# Patient Record
Sex: Male | Born: 1955 | Race: Black or African American | Hispanic: No | Marital: Married | Smoking: Former smoker
Health system: Southern US, Community
[De-identification: ages and names within clinical notes are randomized; demographics above are authoritative.]

## PROBLEM LIST (undated history)

## (undated) DIAGNOSIS — I739 Peripheral vascular disease, unspecified: Secondary | ICD-10-CM

## (undated) DIAGNOSIS — I350 Nonrheumatic aortic (valve) stenosis: Secondary | ICD-10-CM

## (undated) DIAGNOSIS — E785 Hyperlipidemia, unspecified: Secondary | ICD-10-CM

## (undated) DIAGNOSIS — I10 Essential (primary) hypertension: Secondary | ICD-10-CM

## (undated) DIAGNOSIS — F419 Anxiety disorder, unspecified: Secondary | ICD-10-CM

## (undated) DIAGNOSIS — D649 Anemia, unspecified: Secondary | ICD-10-CM

## (undated) DIAGNOSIS — F17200 Nicotine dependence, unspecified, uncomplicated: Secondary | ICD-10-CM

## (undated) DIAGNOSIS — K219 Gastro-esophageal reflux disease without esophagitis: Secondary | ICD-10-CM

## (undated) DIAGNOSIS — E782 Mixed hyperlipidemia: Secondary | ICD-10-CM

## (undated) DIAGNOSIS — E119 Type 2 diabetes mellitus without complications: Secondary | ICD-10-CM

## (undated) HISTORY — DX: Type 2 diabetes mellitus without complications: E11.9

## (undated) HISTORY — DX: Essential (primary) hypertension: I10

## (undated) HISTORY — DX: Peripheral vascular disease, unspecified: I73.9

## (undated) HISTORY — DX: Mixed hyperlipidemia: E78.2

## (undated) HISTORY — PX: HERNIA REPAIR: SHX51

---
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
## 2002-08-26 ENCOUNTER — Emergency Department (HOSPITAL_COMMUNITY): Admission: EM | Admit: 2002-08-26 | Discharge: 2002-08-27 | Payer: Self-pay | Admitting: *Deleted

## 2002-08-26 ENCOUNTER — Encounter: Payer: Self-pay | Admitting: *Deleted

## 2005-03-03 ENCOUNTER — Emergency Department (HOSPITAL_COMMUNITY): Admission: EM | Admit: 2005-03-03 | Discharge: 2005-03-03 | Payer: Self-pay | Admitting: Emergency Medicine

## 2005-04-11 ENCOUNTER — Emergency Department (HOSPITAL_COMMUNITY): Admission: EM | Admit: 2005-04-11 | Discharge: 2005-04-11 | Payer: Self-pay | Admitting: Emergency Medicine

## 2007-01-31 ENCOUNTER — Emergency Department (HOSPITAL_COMMUNITY): Admission: EM | Admit: 2007-01-31 | Discharge: 2007-01-31 | Payer: Self-pay | Admitting: Emergency Medicine

## 2008-03-28 ENCOUNTER — Emergency Department (HOSPITAL_COMMUNITY): Admission: EM | Admit: 2008-03-28 | Discharge: 2008-03-28 | Payer: Self-pay | Admitting: Emergency Medicine

## 2008-09-10 ENCOUNTER — Emergency Department (HOSPITAL_COMMUNITY): Admission: EM | Admit: 2008-09-10 | Discharge: 2008-09-10 | Payer: Self-pay | Admitting: Emergency Medicine

## 2010-03-24 ENCOUNTER — Emergency Department (HOSPITAL_COMMUNITY)
Admission: EM | Admit: 2010-03-24 | Discharge: 2010-03-24 | Disposition: A | Discharge status: Home / Self Care | Payer: Medicaid Other | Attending: Emergency Medicine | Admitting: Emergency Medicine

## 2010-03-24 DIAGNOSIS — I10 Essential (primary) hypertension: Secondary | ICD-10-CM | POA: Insufficient documentation

## 2010-03-24 DIAGNOSIS — L259 Unspecified contact dermatitis, unspecified cause: Secondary | ICD-10-CM | POA: Insufficient documentation

## 2010-03-24 DIAGNOSIS — Z79899 Other long term (current) drug therapy: Secondary | ICD-10-CM | POA: Insufficient documentation

## 2010-03-24 DIAGNOSIS — E7800 Pure hypercholesterolemia, unspecified: Secondary | ICD-10-CM | POA: Insufficient documentation

## 2010-04-18 ENCOUNTER — Emergency Department (HOSPITAL_COMMUNITY)
Admission: EM | Admit: 2010-04-18 | Discharge: 2010-04-18 | Disposition: A | Discharge status: Home / Self Care | Payer: Medicaid Other | Attending: Emergency Medicine | Admitting: Emergency Medicine

## 2010-04-18 DIAGNOSIS — L259 Unspecified contact dermatitis, unspecified cause: Secondary | ICD-10-CM | POA: Insufficient documentation

## 2010-04-18 DIAGNOSIS — Z79899 Other long term (current) drug therapy: Secondary | ICD-10-CM | POA: Insufficient documentation

## 2010-04-18 DIAGNOSIS — E7800 Pure hypercholesterolemia, unspecified: Secondary | ICD-10-CM | POA: Insufficient documentation

## 2010-04-18 DIAGNOSIS — I10 Essential (primary) hypertension: Secondary | ICD-10-CM | POA: Insufficient documentation

## 2011-04-16 ENCOUNTER — Encounter (HOSPITAL_COMMUNITY): Payer: Self-pay | Admitting: *Deleted

## 2011-04-16 ENCOUNTER — Emergency Department (HOSPITAL_COMMUNITY): Payer: Medicaid Other

## 2011-04-16 ENCOUNTER — Emergency Department (HOSPITAL_COMMUNITY)
Admission: EM | Admit: 2011-04-16 | Discharge: 2011-04-16 | Disposition: A | Discharge status: Home / Self Care | Payer: Medicaid Other | Attending: Emergency Medicine | Admitting: Emergency Medicine

## 2011-04-16 DIAGNOSIS — E119 Type 2 diabetes mellitus without complications: Secondary | ICD-10-CM | POA: Insufficient documentation

## 2011-04-16 DIAGNOSIS — I10 Essential (primary) hypertension: Secondary | ICD-10-CM | POA: Insufficient documentation

## 2011-04-16 DIAGNOSIS — F17200 Nicotine dependence, unspecified, uncomplicated: Secondary | ICD-10-CM | POA: Insufficient documentation

## 2011-04-16 DIAGNOSIS — R1903 Right lower quadrant abdominal swelling, mass and lump: Secondary | ICD-10-CM | POA: Insufficient documentation

## 2011-04-16 DIAGNOSIS — R1031 Right lower quadrant pain: Secondary | ICD-10-CM | POA: Insufficient documentation

## 2011-04-16 HISTORY — DX: Essential (primary) hypertension: I10

## 2011-04-16 LAB — DIFFERENTIAL
Basophils Absolute: 0 10*3/uL (ref 0.0–0.1)
Basophils Relative: 0 % (ref 0–1)
Eosinophils Absolute: 0.2 10*3/uL (ref 0.0–0.7)
Lymphs Abs: 2 10*3/uL (ref 0.7–4.0)
Neutrophils Relative %: 55 % (ref 43–77)

## 2011-04-16 LAB — CBC
Hemoglobin: 15.2 g/dL (ref 13.0–17.0)
MCH: 29.4 pg (ref 26.0–34.0)
MCHC: 34.4 g/dL (ref 30.0–36.0)
MCV: 85.5 fL (ref 78.0–100.0)
Platelets: 210 10*3/uL (ref 150–400)

## 2011-04-16 LAB — URINALYSIS, ROUTINE W REFLEX MICROSCOPIC
Ketones, ur: NEGATIVE mg/dL
Leukocytes, UA: NEGATIVE
Nitrite: NEGATIVE
Specific Gravity, Urine: 1.015 (ref 1.005–1.030)
pH: 6 (ref 5.0–8.0)

## 2011-04-16 LAB — COMPREHENSIVE METABOLIC PANEL
BUN: 8 mg/dL (ref 6–23)
Calcium: 9.9 mg/dL (ref 8.4–10.5)
Creatinine, Ser: 0.84 mg/dL (ref 0.50–1.35)
GFR calc Af Amer: >90 mL/min (ref >90)
Glucose, Bld: 209 mg/dL — ABNORMAL HIGH (ref 70–99)
Total Protein: 8.2 g/dL (ref 6.0–8.3)

## 2011-04-16 MED ORDER — IBUPROFEN 600 MG PO TABS: 600.0000 mg | ORAL_TABLET | Freq: Four times a day (QID) | ORAL | Status: AC | PRN

## 2011-04-16 NOTE — ED Notes (Signed)
Pain and swelling on right side of abdomen

## 2011-04-16 NOTE — ED Notes (Signed)
Discharge instructions reviewed with pt, questions answered. Pt verbalized understanding.  

## 2011-04-16 NOTE — ED Provider Notes (Signed)
History  This chart was scribed for Juan Racer, MD by Bennett Scrape. This patient was seen in room APA05/[facility]05 and the patient's care was started at 6:41PM.  CSN: [id number]  Arrival date & time 04/16/11  1607   First MD Initiated Contact with Patient 04/16/11 1819      Chief Complaint  Patient presents with  . Abdominal Pain    Patient is a 56 y.o. male presenting with abdominal pain. The history is provided by the patient. No language interpreter was used.  Abdominal Pain The primary symptoms of the illness include abdominal pain. The primary symptoms of the illness do not include fever, shortness of breath, nausea, vomiting, diarrhea or dysuria. The current episode started more than 2 days ago. The onset of the illness was gradual. The problem has been gradually worsening.  The abdominal pain began more than 2 days ago. The pain came on gradually. The abdominal pain has been gradually worsening since its onset. The abdominal pain is located in the RLQ. The abdominal pain does not radiate. The abdominal pain is relieved by nothing. The abdominal pain is exacerbated by movement.  The patient has not had a change in bowel habit. Symptoms associated with the illness do not include chills, hematuria or back pain.    Juan Turner is a 56 y.o. male who presents to the Emergency Department complaining of 3 to 4 months of gradual onset, gradually worsening, intermittent RLQ abdominal swelling and pain described as a sharp cramping feeling. He reports that sitting up and movement worsen the pain. He has not taken any medication PTA to improve symptoms. He denies nausea, vomiting and urinary symptoms as associated symptoms. He denies constipation by stating that he has kept regular bowel movement routine. He has a h/o diabetes and HTN. He is a current smoker and alcohol user.   Past Medical History  Diagnosis Date  . Diabetes mellitus   . Hypertension     Past Surgical History    Procedure Date  . Hernia repair     No family history on file.  History  Substance Use Topics  . Smoking status: Current Everyday Smoker -- 1.0 packs/day  . Smokeless tobacco: Not on file  . Alcohol Use: Yes     heavy drinker      Review of Systems  Constitutional: Negative for fever and chills.  HENT: Negative for congestion, sore throat and neck pain.   Eyes: Negative for pain.  Respiratory: Negative for cough and shortness of breath.   Cardiovascular: Negative for chest pain.  Gastrointestinal: Positive for abdominal pain. Negative for nausea, vomiting and diarrhea.  Genitourinary: Negative for dysuria and hematuria.  Musculoskeletal: Negative for back pain.  Skin: Negative for rash.  Neurological: Negative for numbness and headaches.  Psychiatric/Behavioral: Negative for confusion.    Allergies  Review of patient's allergies indicates no known allergies.  Home Medications   Current Outpatient Rx  Name Route Sig Dispense Refill  . ALLOPURINOL 300 MG PO TABS Oral Take 300 mg by mouth daily.    Marland Kitchen AMLODIPINE BESY-BENAZEPRIL HCL 5-10 MG PO CAPS Oral Take 1 capsule by mouth daily.    . IBUPROFEN 600 MG PO TABS Oral Take 1 tablet (600 mg total) by mouth every 6 (six) hours as needed for pain. 30 tablet 0    Triage Vitals: BP 150/92  Pulse 75  Temp(Src) 98.3 F (36.8 C) (Oral)  Resp 20  Ht 6' (1.829 m)  Wt 170 lb (77.111 kg)  BMI 23.06 kg/m2  SpO2 100%  Physical Exam  Nursing note and vitals reviewed. Constitutional: He is oriented to person, place, and time. He appears well-developed and well-nourished.  HENT:  Head: Normocephalic and atraumatic.  Eyes: EOM are normal. Pupils are equal, round, and reactive to light.  Neck: Normal range of motion. Neck supple.  Cardiovascular: Normal rate and regular rhythm.   Pulmonary/Chest: Effort normal and breath sounds normal.  Abdominal: Soft. He exhibits no mass. There is tenderness (Mild RLQ ). There is no rebound  and no guarding.  Musculoskeletal: Normal range of motion. He exhibits no edema.  Neurological: He is alert and oriented to person, place, and time. No cranial nerve deficit.  Skin: Skin is warm and dry.  Psychiatric: He has a normal mood and affect. His behavior is normal.    ED Course  Procedures (including critical care time)  DIAGNOSTIC STUDIES: Oxygen Saturation is 100% on room air, normal by my interpretation.    COORDINATION OF CARE: 6:50PM-Discussed treatment plan with pt and pt agreed to plan. 9:29PM-Pt rechecked and is feeling better. Discussed lab and x-rays results and pt acknowledged them.    Labs Reviewed  COMPREHENSIVE METABOLIC PANEL - Abnormal; Notable for the following:    Glucose, Bld 209 (*)    Alkaline Phosphatase 126 (*)    All other components within normal limits  CBC  DIFFERENTIAL  URINALYSIS, ROUTINE W REFLEX MICROSCOPIC  LIPASE, BLOOD   Dg Abd Acute W/chest  04/16/2011  *RADIOLOGY REPORT*  Clinical Data: Right lower quadrant pain  ACUTE ABDOMEN SERIES (ABDOMEN 2 VIEW & CHEST 1 VIEW)  Comparison: None.  Findings: Heart size and vascularity are normal.  Lungs are clear without infiltrate or effusion.  No mass lesion.  Normal bowel gas pattern.  No evidence of bowel obstruction or free intraperitoneal gas.  No bony abnormality.  No renal calculi.  IMPRESSION: No acute abnormality chest or abdomen.  Original Report Authenticated By: Camelia Phenes, M.D.     1. Abdominal pain       MDM  Pt in no distress. Walking around halls asking to be d/c'd home. F/u for worsening pain or concerns      I personally performed the services described in this documentation, which was scribed in my presence. The recorded information has been reviewed and considered.     Juan Racer, MD 04/16/11 2141

## 2015-11-15 LAB — LIPID PANEL
Cholesterol: 214
HDL Cholesterol: 47 (ref 35–70)
LDL Cholesterol: 124
Total CHOL/HDL Ratio: 4.6
Triglycerides: 214 — AB (ref 40–160)
VLDL Cholesterol Cal: 43

## 2015-11-15 LAB — CBC WITH DIFF/PLATELET
BASO(ABSOLUTE): 0
Basophil %: 0
Eosinophils Absolute: 180
Eosinophils, %: 3
HCT: 44 — AB (ref 29–41)
Hemoglobin: 14.3
Lymphocytes: 33
Lymphs Abs: 1980
MCH: 28.7
MCHC: 32.7
MCV: 87.8 (ref 76–111)
MPV: 10.3 fL (ref 7.5–11.5)
Monocytes(Absolute): 480
Monocytes: 8
Neutro Abs: 3360
Neutrophils: 56
RBC: 4.98 (ref 3.87–5.11)
RDW: 13.7
WBC: 6
platelet count: 187

## 2015-11-15 LAB — PSA: Prostate Specific Ag, Serum: 1

## 2015-11-15 LAB — CMP 10231
ALT: 36 — AB (ref 3–30)
AST: 35
Albumin: 4.3
Alkaline Phosphatase: 120
Calcium: 9.1
Carbon Dioxide, Total: 26
Chloride: 102
Creat: 1.03
Glucose: 159
Potassium: 4.1
Sodium: 137
Total Protein: 7.6 (ref 6.4–8.2)

## 2015-11-15 LAB — HEMOGLOBIN A1C: Hgb A1c MFr Bld: 8 — AB (ref 4.0–6.0)

## 2017-06-21 ENCOUNTER — Encounter (HOSPITAL_COMMUNITY): Payer: Self-pay | Admitting: Emergency Medicine

## 2017-06-21 ENCOUNTER — Emergency Department (HOSPITAL_COMMUNITY)
Admission: EM | Admit: 2017-06-21 | Discharge: 2017-06-21 | Disposition: A | Discharge status: Home / Self Care | Payer: Medicaid Other | Attending: Emergency Medicine | Admitting: Emergency Medicine

## 2017-06-21 ENCOUNTER — Other Ambulatory Visit: Payer: Self-pay

## 2017-06-21 DIAGNOSIS — K047 Periapical abscess without sinus: Secondary | ICD-10-CM | POA: Insufficient documentation

## 2017-06-21 DIAGNOSIS — F17210 Nicotine dependence, cigarettes, uncomplicated: Secondary | ICD-10-CM | POA: Diagnosis not present

## 2017-06-21 DIAGNOSIS — E119 Type 2 diabetes mellitus without complications: Secondary | ICD-10-CM | POA: Diagnosis not present

## 2017-06-21 DIAGNOSIS — I10 Essential (primary) hypertension: Secondary | ICD-10-CM | POA: Insufficient documentation

## 2017-06-21 LAB — CBG MONITORING, ED: GLUCOSE-CAPILLARY: 328 mg/dL — AB (ref 65–99)

## 2017-06-21 MED ORDER — AMOXICILLIN 500 MG PO CAPS: 500.0000 mg | ORAL_CAPSULE | Freq: Three times a day (TID) | ORAL | 0 refills | Status: DC

## 2017-06-21 MED ORDER — DICLOFENAC SODIUM 50 MG PO TBEC: 50.0000 mg | DELAYED_RELEASE_TABLET | Freq: Two times a day (BID) | ORAL | 0 refills | Status: DC

## 2017-06-21 NOTE — ED Notes (Signed)
Pt with L lower jaw swelling since this am  Dental problems

## 2017-06-21 NOTE — ED Provider Notes (Signed)
[facility] EMERGENCY DEPARTMENT Provider Note   CSN: [id number] Arrival date & time: 06/21/17  1658     History   Chief Complaint Chief Complaint  Patient presents with  . Abscess    dental    HPI Juan Turner is a 62 y.o. male.  The history is provided by the patient. No language interpreter was used.  Abscess  Location:  Mouth Mouth abscess location:  Lower gingiva Size:  1 Abscess quality: draining   Red streaking: no   Progression:  Worsening Chronicity:  New Context: not diabetes   Relieved by:  Nothing Worsened by:  Nothing Ineffective treatments:  None tried Associated symptoms: no fever   Pt complains of multiple bad teeth.  Pt reports swelling around lower teeth.   Past Medical History:  Diagnosis Date  . Diabetes mellitus   . Hypertension     There are no active problems to display for this patient.   Past Surgical History:  Procedure Laterality Date  . HERNIA REPAIR          Home Medications    Prior to Admission medications   Medication Sig Start Date End Date Taking? Authorizing Provider  allopurinol (ZYLOPRIM) 300 MG tablet Take 300 mg by mouth daily.    [provider]  amLODipine-benazepril (LOTREL) 5-10 MG per capsule Take 1 capsule by mouth daily.    [provider]  amoxicillin (AMOXIL) 500 MG capsule Take 1 capsule (500 mg total) by mouth 3 (three) times daily. 06/21/17   Elson Areas, PA-C  diclofenac (VOLTAREN) 50 MG EC tablet Take 1 tablet (50 mg total) by mouth 2 (two) times daily. 06/21/17   Elson Areas, PA-C    Family History History reviewed. No pertinent family history.  Social History Social History   Tobacco Use  . Smoking status: Current Every Day Smoker    Packs/day: 1.00  . Smokeless tobacco: Never Used  Substance Use Topics  . Alcohol use: Yes    Comment: heavy drinker  . Drug use: Not on file     Allergies   Patient has no known allergies.   Review of Systems Review of  Systems  Constitutional: Negative for fever.  All other systems reviewed and are negative.    Physical Exam Updated Vital Signs BP (!) 156/89 (BP Location: Right Arm)   Temp 98.4 F (36.9 C) (Oral)   Resp 18   Ht 6' (1.829 m)   Wt 77.1 kg (170 lb)   SpO2 99%   BMI 23.06 kg/m   Physical Exam  Constitutional: He appears well-developed and well-nourished.  HENT:  Head: Normocephalic.  Swelling left lower gumline,  Poor dentition.    Eyes: Pupils are equal, round, and reactive to light. EOM are normal.  Neck: Normal range of motion.  Cardiovascular: Normal rate.  Pulmonary/Chest: Effort normal.  Neurological: He is alert.  Skin: Skin is warm.  Psychiatric: He has a normal mood and affect.  Nursing note and vitals reviewed.    ED Treatments / Results  Labs (all labs ordered are listed, but only abnormal results are displayed) Labs Reviewed  CBG MONITORING, ED - Abnormal; Notable for the following components:      Result Value   Glucose-Capillary 328 (*)    All other components within normal limits    EKG None  Radiology No results found.  Procedures Procedures (including critical care time)  Medications Ordered in ED Medications - No data to display   Initial Impression /  Assessment and Plan / ED Course  I have reviewed the triage vital signs and the nursing notes.  Pertinent labs & imaging results that were available during my care of the patient were reviewed by me and considered in my medical decision making (see chart for details).       Final Clinical Impressions(s) / ED Diagnoses   Final diagnoses:  Dental abscess    ED Discharge Orders        Ordered    diclofenac (VOLTAREN) 50 MG EC tablet  2 times daily     06/21/17 1802    amoxicillin (AMOXIL) 500 MG capsule  3 times daily     06/21/17 1802       Osie Cheeks 06/21/17 Mariana Arn, MD 06/21/17 2252

## 2017-06-21 NOTE — Discharge Instructions (Signed)
See the dentist for treatment.

## 2017-06-21 NOTE — ED Triage Notes (Signed)
Dental swelling noted to left side of face.known caries

## 2018-12-06 ENCOUNTER — Emergency Department (HOSPITAL_COMMUNITY)
Admission: EM | Admit: 2018-12-06 | Discharge: 2018-12-06 | Disposition: A | Discharge status: Home / Self Care | Payer: Medicaid Other | Attending: Emergency Medicine | Admitting: Emergency Medicine

## 2018-12-06 ENCOUNTER — Emergency Department (HOSPITAL_COMMUNITY): Payer: Medicaid Other

## 2018-12-06 ENCOUNTER — Other Ambulatory Visit: Payer: Self-pay

## 2018-12-06 ENCOUNTER — Encounter (HOSPITAL_COMMUNITY): Payer: Self-pay

## 2018-12-06 DIAGNOSIS — F17200 Nicotine dependence, unspecified, uncomplicated: Secondary | ICD-10-CM | POA: Insufficient documentation

## 2018-12-06 DIAGNOSIS — R2241 Localized swelling, mass and lump, right lower limb: Secondary | ICD-10-CM | POA: Diagnosis present

## 2018-12-06 DIAGNOSIS — L03032 Cellulitis of left toe: Secondary | ICD-10-CM

## 2018-12-06 DIAGNOSIS — E119 Type 2 diabetes mellitus without complications: Secondary | ICD-10-CM | POA: Diagnosis not present

## 2018-12-06 DIAGNOSIS — L03116 Cellulitis of left lower limb: Secondary | ICD-10-CM | POA: Insufficient documentation

## 2018-12-06 DIAGNOSIS — I10 Essential (primary) hypertension: Secondary | ICD-10-CM | POA: Diagnosis not present

## 2018-12-06 DIAGNOSIS — Z79899 Other long term (current) drug therapy: Secondary | ICD-10-CM | POA: Insufficient documentation

## 2018-12-06 HISTORY — DX: Anxiety disorder, unspecified: F41.9

## 2018-12-06 MED ORDER — CLINDAMYCIN HCL 300 MG PO CAPS: 300.0000 mg | ORAL_CAPSULE | Freq: Three times a day (TID) | ORAL | 0 refills | Status: DC

## 2018-12-06 MED ORDER — VANCOMYCIN HCL IN DEXTROSE 1-5 GM/200ML-% IV SOLN
1000.0000 mg | Freq: Once | INTRAVENOUS | Status: AC
  Administered 2018-12-06: 10:00:00 1000 mg via INTRAVENOUS
  Filled 2018-12-06: qty 200

## 2018-12-06 NOTE — Discharge Instructions (Addendum)
Soak your foot in warm water 2-3 times a day, elevate your foot when possible.  Keep it bandaged.  Follow-up with your primary doctor or return to the ER for any worsening symptoms such as increasing redness, red streaks or swelling

## 2018-12-06 NOTE — ED Triage Notes (Signed)
Pt reports that toenails on left great toe and second toe came off in bathtub Friday night. Causing toes to burn

## 2018-12-06 NOTE — ED Provider Notes (Signed)
[facility] EMERGENCY DEPARTMENT Provider Note   CSN: [id number] Arrival date & time: 12/06/18  3976     History   Chief Complaint Chief Complaint  Patient presents with  . Toe Pain    HPI SEAB Juan Turner is a 63 y.o. male.     HPI   Juan Turner is a 63 y.o. male with a past medical history of diet-controlled diabetes, hypertension, and anxiety who presents to the Emergency Department complaining of redness, swelling, and pain with burning to the first and second toes of the right foot.  Symptoms have been present for greater than 1 week.  He describes a burning sensation and pain associated with weightbearing.  3 days ago, he states the toenails of the great toe and second toe came off while bathing.  He has been applying Neosporin ointment without relief.  He states that he is seen his PCP for this and was prescribed oral antibiotics without improvement.  He denies known injury, fever, chills, numbness or red streaking of his foot.  He denies symptoms proximal to his toes.  Past Medical History:  Diagnosis Date  . Anxiety   . Diabetes mellitus   . Hypertension     There are no active problems to display for this patient.   Past Surgical History:  Procedure Laterality Date  . HERNIA REPAIR        Home Medications    Prior to Admission medications   Medication Sig Start Date End Date Taking? Authorizing Provider  allopurinol (ZYLOPRIM) 300 MG tablet Take 300 mg by mouth daily.    [provider]  amLODipine-benazepril (LOTREL) 5-10 MG per capsule Take 1 capsule by mouth daily.    [provider]  amoxicillin (AMOXIL) 500 MG capsule Take 1 capsule (500 mg total) by mouth 3 (three) times daily. 06/21/17   Elson Areas, PA-C  diclofenac (VOLTAREN) 50 MG EC tablet Take 1 tablet (50 mg total) by mouth 2 (two) times daily. 06/21/17   Elson Areas, PA-C    Family History No family history on file.  Social History Social History   Tobacco Use   . Smoking status: Current Every Day Smoker    Packs/day: 1.00  . Smokeless tobacco: Never Used  Substance Use Topics  . Alcohol use: Not Currently    Comment: heavy drinker  . Drug use: Never     Allergies   Patient has no known allergies.   Review of Systems Review of Systems  Constitutional: Negative for chills and fever.  Respiratory: Negative for cough, chest tightness and shortness of breath.   Gastrointestinal: Negative for nausea and vomiting.  Musculoskeletal: Positive for arthralgias (Pain, redness, and swelling first and second toes of the left foot). Negative for joint swelling.  Skin: Negative for rash.  Neurological: Negative for weakness and numbness.     Physical Exam Updated Vital Signs BP (!) 178/96   Pulse 70   Temp 97.9 F (36.6 C) (Oral)   Resp 12   SpO2 100%   Physical Exam Vitals signs and nursing note reviewed.  Constitutional:      General: He is not in acute distress.    Appearance: Normal appearance. He is not toxic-appearing.  HENT:     Head: Normocephalic.  Cardiovascular:     Rate and Rhythm: Normal rate and regular rhythm.     Pulses: Normal pulses.  Pulmonary:     Effort: Pulmonary effort is normal.     Breath sounds: Normal  breath sounds.  Musculoskeletal: Normal range of motion.        General: Tenderness present. No signs of injury.     Right lower leg: No edema.     Left lower leg: No edema.     Left foot: Tenderness and swelling present.     Comments: Erythema and edema noted to the distal left second toe.  Toenails are absent to the left great and second toes.  Left foot is non-tender.  Extremity is warm.  See photo below  Skin:    General: Skin is warm.     Capillary Refill: Capillary refill takes less than 2 seconds.     Findings: Erythema present. No rash.  Neurological:     General: No focal deficit present.     Mental Status: He is alert.     Sensory: No sensory deficit.     Motor: No weakness.        ED  Treatments / Results  Labs (all labs ordered are listed, but only abnormal results are displayed) Labs Reviewed - No data to display  EKG None  Radiology Dg Foot Complete Left  Result Date: 12/06/2018 CLINICAL DATA:  Pain, redness, swelling in left foot EXAM: LEFT FOOT - COMPLETE 3+ VIEW COMPARISON:  None. FINDINGS: Mild degenerative changes at the 1st MTP joint with joint space narrowing and early spurring. No acute bony abnormality. Specifically, no fracture, subluxation, or dislocation. IMPRESSION: No acute bony abnormality. Electronically Signed   By: Rolm Baptise M.D.   On: 12/06/2018 09:24    Procedures Procedures (including critical care time)  Medications Ordered in ED Medications  vancomycin (VANCOCIN) IVPB 1000 mg/200 mL premix (0 mg Intravenous Stopped 12/06/18 1104)     Initial Impression / Assessment and Plan / ED Course  I have reviewed the triage vital signs and the nursing notes.  Pertinent labs & imaging results that were available during my care of the patient were reviewed by me and considered in my medical decision making (see chart for details).    Pt with likely cellulitis of the toes.  NV intact.  Vitals reviewed. No lymphangitis.  No sx's proximal to the distal foot. No radiographic evidence of osteomyelitis. Pt is well appearing and non-toxic.  No concerning sx's for sepsis.      Pt also seen by Dr. Lacinda Axon and care plan discussed.   Given IV abx here and rx written for clindamycin.  He appears appropriate for d/c home and agrees to close out pt f/u.  Return precautions also discussed   Final Clinical Impressions(s) / ED Diagnoses   Final diagnoses:  Cellulitis of second toe of left foot    ED Discharge Orders         Ordered    clindamycin (CLEOCIN) 300 MG capsule  3 times daily     12/06/18 1100           Kem Parkinson, PA-C 12/07/18 2129    Nat Christen, MD 12/09/18 1004

## 2018-12-06 NOTE — ED Notes (Signed)
Toe nails have fallen off of great toe and second toe on left foot. No signs of infection

## 2018-12-20 ENCOUNTER — Other Ambulatory Visit: Payer: Self-pay | Admitting: Cardiology

## 2018-12-20 DIAGNOSIS — Z20822 Contact with and (suspected) exposure to covid-19: Secondary | ICD-10-CM

## 2018-12-21 LAB — NOVEL CORONAVIRUS, NAA: SARS-CoV-2, NAA: NOT DETECTED

## 2019-01-07 ENCOUNTER — Other Ambulatory Visit: Payer: Self-pay

## 2019-01-07 ENCOUNTER — Emergency Department (HOSPITAL_COMMUNITY): Payer: Medicare Other

## 2019-01-07 ENCOUNTER — Emergency Department (HOSPITAL_COMMUNITY)
Admission: EM | Admit: 2019-01-07 | Discharge: 2019-01-07 | Disposition: A | Discharge status: Home / Self Care | Payer: Medicare Other | Attending: Emergency Medicine | Admitting: Emergency Medicine

## 2019-01-07 DIAGNOSIS — E1165 Type 2 diabetes mellitus with hyperglycemia: Secondary | ICD-10-CM

## 2019-01-07 DIAGNOSIS — E119 Type 2 diabetes mellitus without complications: Secondary | ICD-10-CM | POA: Diagnosis not present

## 2019-01-07 DIAGNOSIS — F17210 Nicotine dependence, cigarettes, uncomplicated: Secondary | ICD-10-CM | POA: Diagnosis not present

## 2019-01-07 DIAGNOSIS — I10 Essential (primary) hypertension: Secondary | ICD-10-CM | POA: Insufficient documentation

## 2019-01-07 DIAGNOSIS — Z79899 Other long term (current) drug therapy: Secondary | ICD-10-CM | POA: Insufficient documentation

## 2019-01-07 DIAGNOSIS — M79675 Pain in left toe(s): Secondary | ICD-10-CM

## 2019-01-07 LAB — BASIC METABOLIC PANEL
Anion gap: 9 (ref 5–15)
BUN: 9 mg/dL (ref 8–23)
CO2: 24 mmol/L (ref 22–32)
Calcium: 8.9 mg/dL (ref 8.9–10.3)
Chloride: 101 mmol/L (ref 98–111)
Creatinine, Ser: 1.01 mg/dL (ref 0.61–1.24)
GFR calc Af Amer: >60 mL/min (ref >60)
GFR calc non Af Amer: >60 mL/min (ref >60)
Glucose, Bld: 184 mg/dL — ABNORMAL HIGH (ref 70–99)
Potassium: 3.6 mmol/L (ref 3.5–5.1)
Sodium: 134 mmol/L — ABNORMAL LOW (ref 135–145)

## 2019-01-07 LAB — CBC WITH DIFFERENTIAL/PLATELET
Abs Immature Granulocytes: 0.01 10*3/uL (ref 0.00–0.07)
Basophils Absolute: 0 10*3/uL (ref 0.0–0.1)
Basophils Relative: 1 %
Eosinophils Absolute: 0.2 10*3/uL (ref 0.0–0.5)
Eosinophils Relative: 3 %
HCT: 41 % (ref 39.0–52.0)
Hemoglobin: 13.2 g/dL (ref 13.0–17.0)
Immature Granulocytes: 0 %
Lymphocytes Relative: 35 %
Lymphs Abs: 2.2 10*3/uL (ref 0.7–4.0)
MCH: 28.3 pg (ref 26.0–34.0)
MCHC: 32.2 g/dL (ref 30.0–36.0)
MCV: 87.8 fL (ref 80.0–100.0)
Monocytes Absolute: 0.5 10*3/uL (ref 0.1–1.0)
Monocytes Relative: 8 %
Neutro Abs: 3.3 10*3/uL (ref 1.7–7.7)
Neutrophils Relative %: 53 %
Platelets: 149 10*3/uL — ABNORMAL LOW (ref 150–400)
RBC: 4.67 MIL/uL (ref 4.22–5.81)
RDW: 12.7 % (ref 11.5–15.5)
WBC: 6.3 10*3/uL (ref 4.0–10.5)
nRBC: 0 % (ref 0.0–0.2)

## 2019-01-07 MED ORDER — GLIPIZIDE 5 MG PO TABS: 5.0000 mg | ORAL_TABLET | Freq: Every day | ORAL | 0 refills | Status: DC

## 2019-01-07 MED ORDER — IBUPROFEN 800 MG PO TABS
800.0000 mg | ORAL_TABLET | Freq: Once | ORAL | Status: AC
  Administered 2019-01-07: 800 mg via ORAL
  Filled 2019-01-07: qty 1

## 2019-01-07 MED ORDER — CLINDAMYCIN HCL 150 MG PO CAPS
300.0000 mg | ORAL_CAPSULE | Freq: Once | ORAL | Status: AC
  Administered 2019-01-07: 23:00:00 300 mg via ORAL
  Filled 2019-01-07: qty 2

## 2019-01-07 MED ORDER — CLINDAMYCIN HCL 150 MG PO CAPS
300.0000 mg | ORAL_CAPSULE | Freq: Three times a day (TID) | ORAL | 0 refills | Status: AC
Start: 2019-01-07 — End: 2019-01-14

## 2019-01-07 MED ORDER — IBUPROFEN 600 MG PO TABS: 600.0000 mg | ORAL_TABLET | Freq: Three times a day (TID) | ORAL | 0 refills | Status: DC | PRN

## 2019-01-07 MED ORDER — HYDROCODONE-ACETAMINOPHEN 5-325 MG PO TABS
1.0000 | ORAL_TABLET | Freq: Once | ORAL | Status: AC
  Administered 2019-01-07: 22:00:00 1 via ORAL
  Filled 2019-01-07: qty 1

## 2019-01-07 NOTE — ED Triage Notes (Signed)
Pt c/o toe pain almost a month. Second toe, left foot. Michela Pitcher it is burning and turning color. States that he was told to come back if it is not getting any better. Was given medication for it when he was here last but it hasn't help. Doesn't remember thinks it was an abx,  Hurts worse at night.

## 2019-01-07 NOTE — ED Provider Notes (Signed)
[facility] EMERGENCY DEPARTMENT Provider Note   CSN: [id number] Arrival date & time: 01/07/19  2033     History   Chief Complaint Chief Complaint  Patient presents with  . Toe Pain    HPI Juan Turner is a 63 y.o. male with a history of HTN and diabetes who was seen here one month ago for a cellulitis of his left foot, localizing to the great and second toe returns with complaint of persistent pain, swelling and a dark coloration of the 2nd toe.  He denies pain during the day but at night the toe burns and keeps him awake.  He denies fevers, chills, n/v, no other complaints.  No radiation of pain. He reports completing the entire course of the clindamycin prescribed.    Pt reports being a diabetic but states he has never been on a medication for this condition.       The history is provided by the patient.    Past Medical History:  Diagnosis Date  . Anxiety   . Diabetes mellitus   . Hypertension     There are no active problems to display for this patient.   Past Surgical History:  Procedure Laterality Date  . HERNIA REPAIR          Home Medications    Prior to Admission medications   Medication Sig Start Date End Date Taking? Authorizing Provider  amLODipine-benazepril (LOTREL) 5-10 MG per capsule Take 1 capsule by mouth daily.   Yes [provider]  clindamycin (CLEOCIN) 150 MG capsule Take 2 capsules (300 mg total) by mouth 3 (three) times daily for 7 days. 01/07/19 01/14/19  Burgess Amor, PA-C  glipiZIDE (GLUCOTROL) 5 MG tablet Take 1 tablet (5 mg total) by mouth daily before breakfast. 01/07/19   [name], Raynelle Fanning, PA-C  ibuprofen (ADVIL) 600 MG tablet Take 1 tablet (600 mg total) by mouth every 8 (eight) hours as needed for moderate pain. 01/07/19   Burgess Amor, PA-C    Family History No family history on file.  Social History Social History   Tobacco Use  . Smoking status: Current Every Day Smoker    Packs/day: 1.00  . Smokeless tobacco:  Never Used  Substance Use Topics  . Alcohol use: Not Currently    Comment: heavy drinker  . Drug use: Never     Allergies   Patient has no known allergies.   Review of Systems Review of Systems  Constitutional: Negative for chills and fever.  HENT: Negative for congestion and sore throat.   Eyes: Negative.   Respiratory: Negative for chest tightness and shortness of breath.   Cardiovascular: Negative for chest pain.  Gastrointestinal: Negative for abdominal pain and nausea.  Genitourinary: Negative.   Musculoskeletal: Positive for arthralgias. Negative for joint swelling and neck pain.  Skin: Positive for color change and wound. Negative for rash.  Neurological: Negative for dizziness, weakness, light-headedness, numbness and headaches.  Psychiatric/Behavioral: Negative.      Physical Exam Updated Vital Signs BP (!) 154/79 (BP Location: Right Arm)   Pulse 66   Temp 98 F (36.7 C) (Oral)   Resp 16   Wt 78 kg   SpO2 100%   BMI 23.32 kg/m   Physical Exam Constitutional:      Appearance: He is well-developed.  HENT:     Head: Atraumatic.  Neck:     Musculoskeletal: Normal range of motion.  Cardiovascular:     Comments: Pulses equal bilaterally Musculoskeletal:  General: Tenderness present.       Feet:     Comments: ttp left 2nd toe, mild edema, slightly dusky appearance to skin, but with good cap refill in toe tip.  There is a small ulceration noted at distal tip.  Foot is warm but without increased warmth.  Dorsalis pedal pulse present. No drainage from ulcer, no red streaking.   Skin:    General: Skin is warm and dry.     Capillary Refill: Capillary refill takes less than 2 seconds.  Neurological:     Mental Status: He is alert.     Sensory: No sensory deficit.     Deep Tendon Reflexes: Reflexes normal.      ED Treatments / Results  Labs (all labs ordered are listed, but only abnormal results are displayed) Labs Reviewed  CBC WITH  DIFFERENTIAL/PLATELET - Abnormal; Notable for the following components:      Result Value   Platelets 149 (*)    All other components within normal limits  BASIC METABOLIC PANEL - Abnormal; Notable for the following components:   Sodium 134 (*)    Glucose, Bld 184 (*)    All other components within normal limits    EKG None  Radiology Dg Toe 2nd Left  Result Date: 01/07/2019 CLINICAL DATA:  Pain for a month. EXAM: LEFT SECOND TOE COMPARISON:  None. FINDINGS: There is no evidence of fracture or dislocation. There is no evidence of arthropathy or other focal bone abnormality. Soft tissues are unremarkable. IMPRESSION: Negative. Electronically Signed   By: Gerome Samavid  Williams III M.D   On: 01/07/2019 21:34    Procedures Procedures (including critical care time)  Medications Ordered in ED Medications  HYDROcodone-acetaminophen (NORCO/VICODIN) 5-325 MG per tablet 1 tablet (1 tablet Oral Given 01/07/19 2139)  clindamycin (CLEOCIN) capsule 300 mg (300 mg Oral Given 01/07/19 2259)  ibuprofen (ADVIL) tablet 800 mg (800 mg Oral Given 01/07/19 2259)     Initial Impression / Assessment and Plan / ED Course  I have reviewed the triage vital signs and the nursing notes.  Pertinent labs & imaging results that were available during my care of the patient were reviewed by me and considered in my medical decision making (see chart for details).        Pt with DM, review of chart with prior blood glucose levels ranging 184 (today) to 328 one year ago.  States has never taken DM medication.  Discussed importance of getting blood glucose under better control which can be the reason for his foot infections.  He was started on glucotrol, also prescribed additional clindamycin for tx of suspected early recurrence of infection he was treated for last month.  Referral to Dr. Nolen MuMcKinney for further eval/management of this infection and for diabetic foot care.  He was given referrals for obtaining new pcp as  his MD is retiring.   Return precautions discussed.   Final Clinical Impressions(s) / ED Diagnoses   Final diagnoses:  Toe pain, left  Type 2 diabetes mellitus with hyperglycemia, without long-term current use of insulin [facility](HCC)    ED Discharge Orders         Ordered    clindamycin (CLEOCIN) 150 MG capsule  3 times daily     01/07/19 2245    glipiZIDE (GLUCOTROL) 5 MG tablet  Daily before breakfast     01/07/19 2245    ibuprofen (ADVIL) 600 MG tablet  Every 8 hours PRN     01/07/19 2245  Evalee Jefferson, PA-C 01/08/19 Karmen Bongo, MD 01/08/19 2328

## 2019-01-21 LAB — HEMOGLOBIN A1C: Hgb A1c MFr Bld: 8.4 — AB (ref 4.0–6.0)

## 2019-02-10 ENCOUNTER — Other Ambulatory Visit: Payer: Self-pay

## 2019-02-10 ENCOUNTER — Ambulatory Visit: Payer: Medicaid Other | Admitting: Podiatry

## 2019-02-10 DIAGNOSIS — L97521 Non-pressure chronic ulcer of other part of left foot limited to breakdown of skin: Secondary | ICD-10-CM

## 2019-02-10 DIAGNOSIS — I739 Peripheral vascular disease, unspecified: Secondary | ICD-10-CM | POA: Diagnosis not present

## 2019-02-10 DIAGNOSIS — L84 Corns and callosities: Secondary | ICD-10-CM

## 2019-02-10 MED ORDER — DOXYCYCLINE HYCLATE 100 MG PO TABS: 100.0000 mg | ORAL_TABLET | Freq: Two times a day (BID) | ORAL | 0 refills | Status: DC

## 2019-02-10 MED ORDER — MUPIROCIN 2 % EX OINT: 1.0000 "application " | TOPICAL_OINTMENT | Freq: Two times a day (BID) | CUTANEOUS | 2 refills | Status: DC

## 2019-02-10 NOTE — Patient Instructions (Signed)
Start the doxycyline Apply the mupirocin ointment to the left 2nd toe daily- only apply a small amount Wear surgical shoe I have put in a referral for you to see the circulation doctor.

## 2019-02-10 NOTE — Progress Notes (Signed)
Subjective:   Patient ID: Merry Proud, male   DOB: 63 y.o.   MRN: [id number]   HPI 63 year old male presents the office today for concerns of a wound, possible callus of the left second toe.  He states this started after wearing shoes that were too small.  He gets occasional burning to his feet and legs.  He has previously been to the emergency department 2 times for toe issues starting back on October 26.  He currently denies any drainage or pus.  He has noticed some swelling to the foot but this is improved compared to what it was previously.   Review of Systems  All other systems reviewed and are negative.  Past Medical History:  Diagnosis Date  . Anxiety   . Diabetes mellitus   . Hypertension     Past Surgical History:  Procedure Laterality Date  . HERNIA REPAIR       Current Outpatient Medications:  .  amLODipine-benazepril (LOTREL) 5-10 MG per capsule, Take 1 capsule by mouth daily., Disp: , Rfl:  .  doxycycline (VIBRA-TABS) 100 MG tablet, Take 1 tablet (100 mg total) by mouth 2 (two) times daily., Disp: 20 tablet, Rfl: 0 .  glipiZIDE (GLUCOTROL) 5 MG tablet, Take 1 tablet (5 mg total) by mouth daily before breakfast., Disp: 30 tablet, Rfl: 0 .  ibuprofen (ADVIL) 600 MG tablet, Take 1 tablet (600 mg total) by mouth every 8 (eight) hours as needed for moderate pain., Disp: 30 tablet, Rfl: 0 .  mupirocin ointment (BACTROBAN) 2 %, Apply 1 application topically 2 (two) times daily., Disp: 30 g, Rfl: 2  No Known Allergies       Objective:  Physical Exam  General: AAO x3, NAD  Dermatological: Scab present to the dorsal aspect left second toe distally.  Small meta clear drainage there is no purulence.  There is swelling although mild of the second toe there is no erythema or warmth.  Also preulcerative area left fifth toe but no skin breakdown.  Vascular: Nonpalpable pulses bilaterally.  Neruologic: Sensation decreased with Semmes Weinstein  monofilament  Musculoskeletal: No gross boney pedal deformities bilateral. No pain, crepitus, or limitation noted with foot and ankle range of motion bilateral. Muscular strength 5/5 in all groups tested bilateral.     Assessment:   Left second toe ulceration with preulcerative area left fifth toe, PAD pulses     Plan:  -Treatment options discussed including all alternatives, risks, and complications -Etiology of symptoms were discussed -X-ray negative on November 27th 2020. -Small amount of clear drainage coming from the left second toe.  Prescribed doxycycline.  Also prescribed mupirocin ointment to apply daily.  Immobilization in surgical shoe for offloading of the area to avoid any pressure. -ABI was performed in the office today which was read as "PAD".  Arterial studies ordered as well as referral to vascular surgery. -Monitor for any clinical signs or symptoms of infection and directed to call the office immediately should any occur or go to the ER.  Return in about 2 weeks (around 02/24/2019).  Trula Slade DPM

## 2019-02-14 ENCOUNTER — Telehealth: Payer: Self-pay | Admitting: Podiatry

## 2019-02-14 DIAGNOSIS — I739 Peripheral vascular disease, unspecified: Secondary | ICD-10-CM

## 2019-02-14 DIAGNOSIS — L84 Corns and callosities: Secondary | ICD-10-CM

## 2019-02-14 DIAGNOSIS — L97521 Non-pressure chronic ulcer of other part of left foot limited to breakdown of skin: Secondary | ICD-10-CM

## 2019-02-14 NOTE — Addendum Note (Signed)
Addended by: Alphia Kava D on: 02/14/2019 02:44 PM   Modules accepted: Orders

## 2019-02-14 NOTE — Telephone Encounter (Signed)
Vascular office called stating they received a referral from our office but they do not perform the tests requested on the order but do a similar test. They wanted to check and see if we wanted to do their test or we could send the referral to [facility] where the test is available. Please give them a call back.  

## 2019-02-14 NOTE — Telephone Encounter (Signed)
[employer] - Medicaid requires clinicals for review prior to authorization for 82081 doppler Seg Multi, Case:  [id number]. Faxed 02/10/2019 clinicals and orders to [employer].

## 2019-02-23 ENCOUNTER — Telehealth (HOSPITAL_COMMUNITY): Payer: Self-pay

## 2019-02-23 NOTE — Telephone Encounter (Signed)
**  URGENT consult was already placed in the work que for [facility] to work.    [employer]

## 2019-02-23 NOTE — Telephone Encounter (Signed)
  A referral was placed in work que for an [place] LE Scientist, forensic. I called over to Dr Gabriel Rung office and spoke with nurse Lottie Rater I was advised to change the order to an ABI and Vascular consult for patient. I closed the order for the [place] LE ART Seg multi and will call patient to schedule appointments.   [employer]

## 2019-02-24 ENCOUNTER — Ambulatory Visit: Payer: Medicaid Other | Admitting: Podiatry

## 2019-02-24 ENCOUNTER — Other Ambulatory Visit: Payer: Self-pay

## 2019-02-24 DIAGNOSIS — L97511 Non-pressure chronic ulcer of other part of right foot limited to breakdown of skin: Secondary | ICD-10-CM | POA: Diagnosis not present

## 2019-02-24 DIAGNOSIS — L97519 Non-pressure chronic ulcer of other part of right foot with unspecified severity: Secondary | ICD-10-CM | POA: Diagnosis not present

## 2019-02-24 DIAGNOSIS — I739 Peripheral vascular disease, unspecified: Secondary | ICD-10-CM

## 2019-02-24 DIAGNOSIS — L97521 Non-pressure chronic ulcer of other part of left foot limited to breakdown of skin: Secondary | ICD-10-CM | POA: Diagnosis not present

## 2019-02-24 DIAGNOSIS — L601 Onycholysis: Secondary | ICD-10-CM

## 2019-02-24 MED ORDER — DOXYCYCLINE HYCLATE 100 MG PO TABS: 100.0000 mg | ORAL_TABLET | Freq: Two times a day (BID) | ORAL | 0 refills | Status: DC

## 2019-03-01 LAB — WOUND CULTURE
MICRO NUMBER:: 10042469
SPECIMEN QUALITY:: ADEQUATE

## 2019-03-03 ENCOUNTER — Other Ambulatory Visit: Payer: Self-pay

## 2019-03-03 ENCOUNTER — Ambulatory Visit: Payer: Medicaid Other | Admitting: Podiatry

## 2019-03-03 DIAGNOSIS — L97519 Non-pressure chronic ulcer of other part of right foot with unspecified severity: Secondary | ICD-10-CM

## 2019-03-03 DIAGNOSIS — I739 Peripheral vascular disease, unspecified: Secondary | ICD-10-CM

## 2019-03-03 DIAGNOSIS — L97521 Non-pressure chronic ulcer of other part of left foot limited to breakdown of skin: Secondary | ICD-10-CM

## 2019-03-03 MED ORDER — MUPIROCIN 2 % EX OINT: 1.0000 "application " | TOPICAL_OINTMENT | Freq: Two times a day (BID) | CUTANEOUS | 2 refills | Status: DC

## 2019-03-03 NOTE — Progress Notes (Signed)
Subjective: 64 year old male presents the office today for follow-up evaluation of a wound on his left second toe he also started to notice that there was drainage and bleeding from his right big toenail.  He is unsure of when this started or how it started.  He has not yet follow-up with vascular surgery.  Denies any fevers, chills, nausea, vomiting. No acute changes since last appointment, and no other complaints at this time.   Objective: AAO x3, NAD Nonpalpable pulses. At the distal left second toe is an eschar which is dry there is no drainage or pus.  Minimal edema to the distal aspect of the toe but there is no erythema or warmth.  On the right hallux toenail the nail is loose and underlying nail bed only attached on the very proximal aspect and there is blood, purulent drainage.  Mild edema but there is no ascending cellulitis.  There is no fluctuation or crepitation.  No open lesions or pre-ulcerative lesions.  No pain with calf compression, swelling, warmth, erythema  Assessment: Ulcerations bilateral feet with PAD  Plan: -All treatment options discussed with the patient including all alternatives, risks, complications.  -Given the infection the right side I recommended total nail avulsion as it is almost completely detached and there is pus under the nail.  He did not want to have this performed.  I did try to trim as much the nails I could with any complications.  I cleaned the area as well and also took a wound culture.  Prescribed doxycycline.  Mupirocin ointment bilaterally daily.  Offloading.  Monitoring signs or symptoms of worsening infection to the ER should any occur.  We will follow-up with vascular surgery about scheduling. -Patient encouraged to call the office with any questions, concerns, change in symptoms.   Vivi Barrack DPM

## 2019-03-07 NOTE — Progress Notes (Signed)
Subjective: 64 year old male presents the office today for follow-up evaluation of a wound on his left second toe and also right hallux.  He states he has been on antibiotics.  He states that he feels that he is doing well and has no new concerns.  No pain. Denies any fevers, chills, nausea, vomiting. No acute changes since last appointment, and no other complaints at this time.   Objective: AAO x3, NAD Nonpalpable pulses. At the distal left second toe is an eschar which is dry there is no drainage or pus.  This appears to be stable.  Mild decrease in edema to the toe.  No erythema or warmth.  There is no drainage or pus there is no fluctuation or crepitation any malodor. On the right hallux the nail is still loose at the distal aspect only attached proximally.  Small amount of clear drainage but there is no pus.  It appears that the underlying wound is healing although slowly.  No significant edema, erythema to the toe and no clinical signs of infection are noted otherwise. No open lesions or pre-ulcerative lesions.  No pain with calf compression, swelling, warmth, erythema  Assessment: Ulcerations bilateral feet with PAD  Plan: -All treatment options discussed with the patient including all alternatives, risks, complications.  -Lightly debrided the toenail the right side with any complications or bleeding.  Continue antibiotic ointment dressing changes daily.  Offloading at all times.  Monitor for any signs or symptoms of worsening infection go to the ER should any occur.  We will follow-up in regards to the vascular referral. -Patient encouraged to call the office with any questions, concerns, change in symptoms.   Vivi Barrack DPM

## 2019-03-10 ENCOUNTER — Ambulatory Visit: Payer: Medicaid Other | Admitting: Podiatry

## 2019-03-10 ENCOUNTER — Other Ambulatory Visit: Payer: Self-pay

## 2019-03-10 ENCOUNTER — Encounter: Payer: Self-pay | Admitting: Podiatry

## 2019-03-10 DIAGNOSIS — L97521 Non-pressure chronic ulcer of other part of left foot limited to breakdown of skin: Secondary | ICD-10-CM | POA: Diagnosis not present

## 2019-03-10 DIAGNOSIS — I739 Peripheral vascular disease, unspecified: Secondary | ICD-10-CM

## 2019-03-10 DIAGNOSIS — L97519 Non-pressure chronic ulcer of other part of right foot with unspecified severity: Secondary | ICD-10-CM | POA: Diagnosis not present

## 2019-03-10 MED ORDER — GABAPENTIN 100 MG PO CAPS: 100.0000 mg | ORAL_CAPSULE | Freq: Every day | ORAL | 0 refills | Status: DC

## 2019-03-10 NOTE — Patient Instructions (Signed)
If you notice any increase in swelling, redness, drainage or any other issues please call me immediately at [phone] or go to the ER

## 2019-03-15 NOTE — Progress Notes (Signed)
Subjective: 64 year old male presents the office today for follow-up evaluation of a wound on his left second toe and also right hallux.  He has been wearing surgical shoe on the left side but he started wearing regular shoe on the right side but requesting surgical shoe with a shoe is uncomfortable and putting pressure on left foot.  Denies any drainage or pus in this time ago but he feels that the wound is been getting better.  He did make the appointment to follow-up with vascular surgery proximal circulation.  He is scheduled to see Dr. Randie Heinz. Denies any fevers, chills, nausea, vomiting. No acute changes since last appointment, and no other complaints at this time.   Objective: AAO x3, NAD Nonpalpable pulses. At the distal left second toe is an eschar which is dry there is no drainage or pus.  This appears to be stable and there is mild chronic edema to the toe but there is no significant erythema or warmth there is no drainage of pus or any signs of infection.  To the right hallux toenail smaller clear drainage expressed with there is no pus.  The nail is still loose distally attached proximally.  There is decreased edema.       No pain with calf compression, swelling, warmth, erythema  Assessment: Ulcerations bilateral feet with PAD  Plan: -All treatment options discussed with the patient including all alternatives, risks, complications.  -Surgical shoe was dispensed to the right foot today.  Continue surgical shoe on the left side for offloading.  Continue with small amount of antibiotic ointment dressing changes for now.  Follow-up with vascular surgery.  Monitor closely for any signs or symptoms of infection and report emergency room should any occur.  Return in about 2 weeks (around 03/24/2019).  Vivi Barrack DPM

## 2019-03-21 ENCOUNTER — Telehealth: Payer: Self-pay | Admitting: *Deleted

## 2019-03-21 NOTE — Telephone Encounter (Signed)
Pt left message to call. I left message to call again with the questions.

## 2019-03-22 ENCOUNTER — Telehealth: Payer: Self-pay | Admitting: *Deleted

## 2019-03-22 ENCOUNTER — Other Ambulatory Visit: Payer: Self-pay | Admitting: Podiatry

## 2019-03-22 DIAGNOSIS — L97521 Non-pressure chronic ulcer of other part of left foot limited to breakdown of skin: Secondary | ICD-10-CM

## 2019-03-22 DIAGNOSIS — I739 Peripheral vascular disease, unspecified: Secondary | ICD-10-CM

## 2019-03-22 NOTE — Addendum Note (Signed)
Addended by: Geraldine Contras D on: 03/22/2019 09:14 AM   Modules accepted: Orders

## 2019-03-22 NOTE — Telephone Encounter (Signed)
I cancelled the vascular study from [facility] due to medicaid needed a pre-cert and Angie tried to go thru [place] and it stated that due to [place] and vascular and vein was doing the pre-cert at the same time it kicked both orders out and would not be able to do another pre-cert until 30 days later and we re-entered the order and sent it to [facility]. Misty Stanley

## 2019-03-25 ENCOUNTER — Ambulatory Visit (HOSPITAL_COMMUNITY): Payer: Medicare Other

## 2019-03-25 ENCOUNTER — Encounter: Payer: Medicaid Other | Admitting: Vascular Surgery

## 2019-03-28 ENCOUNTER — Ambulatory Visit: Payer: Medicaid Other | Admitting: Podiatry

## 2019-03-29 ENCOUNTER — Ambulatory Visit (HOSPITAL_COMMUNITY): Admission: RE | Admit: 2019-03-29 | Payer: Medicare Other | Source: Ambulatory Visit

## 2019-03-31 ENCOUNTER — Ambulatory Visit: Payer: Medicaid Other | Admitting: Podiatry

## 2019-04-04 ENCOUNTER — Encounter (HOSPITAL_COMMUNITY): Payer: Self-pay

## 2019-04-04 ENCOUNTER — Emergency Department (HOSPITAL_COMMUNITY): Payer: Medicare Other

## 2019-04-04 ENCOUNTER — Other Ambulatory Visit: Payer: Self-pay

## 2019-04-04 ENCOUNTER — Inpatient Hospital Stay (HOSPITAL_COMMUNITY)
Admission: EM | Admit: 2019-04-04 | Discharge: 2019-04-09 | DRG: 240 | Disposition: A | Discharge status: Home Health | Payer: Medicare Other | Attending: Internal Medicine | Admitting: Internal Medicine

## 2019-04-04 DIAGNOSIS — I739 Peripheral vascular disease, unspecified: Secondary | ICD-10-CM | POA: Diagnosis not present

## 2019-04-04 DIAGNOSIS — L03031 Cellulitis of right toe: Secondary | ICD-10-CM | POA: Diagnosis present

## 2019-04-04 DIAGNOSIS — L97509 Non-pressure chronic ulcer of other part of unspecified foot with unspecified severity: Secondary | ICD-10-CM | POA: Diagnosis not present

## 2019-04-04 DIAGNOSIS — Z01810 Encounter for preprocedural cardiovascular examination: Secondary | ICD-10-CM | POA: Diagnosis not present

## 2019-04-04 DIAGNOSIS — E11621 Type 2 diabetes mellitus with foot ulcer: Secondary | ICD-10-CM | POA: Diagnosis not present

## 2019-04-04 DIAGNOSIS — Z79899 Other long term (current) drug therapy: Secondary | ICD-10-CM

## 2019-04-04 DIAGNOSIS — Z7984 Long term (current) use of oral hypoglycemic drugs: Secondary | ICD-10-CM | POA: Diagnosis not present

## 2019-04-04 DIAGNOSIS — M79671 Pain in right foot: Secondary | ICD-10-CM | POA: Diagnosis present

## 2019-04-04 DIAGNOSIS — E876 Hypokalemia: Secondary | ICD-10-CM | POA: Diagnosis not present

## 2019-04-04 DIAGNOSIS — I10 Essential (primary) hypertension: Secondary | ICD-10-CM | POA: Diagnosis present

## 2019-04-04 DIAGNOSIS — Z791 Long term (current) use of non-steroidal anti-inflammatories (NSAID): Secondary | ICD-10-CM | POA: Diagnosis not present

## 2019-04-04 DIAGNOSIS — I998 Other disorder of circulatory system: Secondary | ICD-10-CM

## 2019-04-04 DIAGNOSIS — E222 Syndrome of inappropriate secretion of antidiuretic hormone: Secondary | ICD-10-CM | POA: Diagnosis not present

## 2019-04-04 DIAGNOSIS — Z20822 Contact with and (suspected) exposure to covid-19: Secondary | ICD-10-CM | POA: Diagnosis present

## 2019-04-04 DIAGNOSIS — F17210 Nicotine dependence, cigarettes, uncomplicated: Secondary | ICD-10-CM | POA: Diagnosis present

## 2019-04-04 DIAGNOSIS — I96 Gangrene, not elsewhere classified: Secondary | ICD-10-CM | POA: Diagnosis present

## 2019-04-04 DIAGNOSIS — E1165 Type 2 diabetes mellitus with hyperglycemia: Secondary | ICD-10-CM | POA: Diagnosis present

## 2019-04-04 DIAGNOSIS — I70209 Unspecified atherosclerosis of native arteries of extremities, unspecified extremity: Secondary | ICD-10-CM | POA: Diagnosis present

## 2019-04-04 DIAGNOSIS — D649 Anemia, unspecified: Secondary | ICD-10-CM | POA: Diagnosis present

## 2019-04-04 DIAGNOSIS — L97519 Non-pressure chronic ulcer of other part of right foot with unspecified severity: Secondary | ICD-10-CM | POA: Diagnosis not present

## 2019-04-04 DIAGNOSIS — E1152 Type 2 diabetes mellitus with diabetic peripheral angiopathy with gangrene: Principal | ICD-10-CM | POA: Diagnosis present

## 2019-04-04 LAB — CBC WITH DIFFERENTIAL/PLATELET
Abs Immature Granulocytes: 0.03 10*3/uL (ref 0.00–0.07)
Basophils Absolute: 0 10*3/uL (ref 0.0–0.1)
Basophils Relative: 0 %
Eosinophils Absolute: 0.1 10*3/uL (ref 0.0–0.5)
Eosinophils Relative: 2 %
HCT: 42.3 % (ref 39.0–52.0)
Hemoglobin: 13.6 g/dL (ref 13.0–17.0)
Immature Granulocytes: 0 %
Lymphocytes Relative: 21 %
Lymphs Abs: 1.6 10*3/uL (ref 0.7–4.0)
MCH: 27.6 pg (ref 26.0–34.0)
MCHC: 32.2 g/dL (ref 30.0–36.0)
MCV: 85.8 fL (ref 80.0–100.0)
Monocytes Absolute: 0.5 10*3/uL (ref 0.1–1.0)
Monocytes Relative: 7 %
Neutro Abs: 5.2 10*3/uL (ref 1.7–7.7)
Neutrophils Relative %: 70 %
Platelets: 247 10*3/uL (ref 150–400)
RBC: 4.93 MIL/uL (ref 4.22–5.81)
RDW: 11.6 % (ref 11.5–15.5)
WBC: 7.5 10*3/uL (ref 4.0–10.5)
nRBC: 0 % (ref 0.0–0.2)

## 2019-04-04 LAB — CBC
HCT: 40.6 % (ref 39.0–52.0)
Hemoglobin: 13.5 g/dL (ref 13.0–17.0)
MCH: 27.8 pg (ref 26.0–34.0)
MCHC: 33.3 g/dL (ref 30.0–36.0)
MCV: 83.5 fL (ref 80.0–100.0)
Platelets: 288 10*3/uL (ref 150–400)
RBC: 4.86 MIL/uL (ref 4.22–5.81)
RDW: 11.6 % (ref 11.5–15.5)
WBC: 8.9 10*3/uL (ref 4.0–10.5)
nRBC: 0 % (ref 0.0–0.2)

## 2019-04-04 LAB — HEMOGLOBIN A1C
Hgb A1c MFr Bld: 9.6 % — ABNORMAL HIGH (ref 4.8–5.6)
Mean Plasma Glucose: 228.82 mg/dL

## 2019-04-04 LAB — CBG MONITORING, ED
Glucose-Capillary: 226 mg/dL — ABNORMAL HIGH (ref 70–99)
Glucose-Capillary: 205 mg/dL — ABNORMAL HIGH (ref 70–99)

## 2019-04-04 LAB — BASIC METABOLIC PANEL
Anion gap: 11 (ref 5–15)
BUN: 13 mg/dL (ref 8–23)
CO2: 25 mmol/L (ref 22–32)
Calcium: 9.4 mg/dL (ref 8.9–10.3)
Chloride: 96 mmol/L — ABNORMAL LOW (ref 98–111)
Creatinine, Ser: 1.04 mg/dL (ref 0.61–1.24)
GFR calc Af Amer: >60 mL/min (ref >60)
GFR calc non Af Amer: >60 mL/min (ref >60)
Glucose, Bld: 321 mg/dL — ABNORMAL HIGH (ref 70–99)
Potassium: 4.3 mmol/L (ref 3.5–5.1)
Sodium: 132 mmol/L — ABNORMAL LOW (ref 135–145)

## 2019-04-04 LAB — CREATININE, SERUM
Creatinine, Ser: 0.9 mg/dL (ref 0.61–1.24)
GFR calc Af Amer: >60 mL/min (ref >60)
GFR calc non Af Amer: >60 mL/min (ref >60)

## 2019-04-04 LAB — C-REACTIVE PROTEIN: CRP: 5.4 mg/dL — ABNORMAL HIGH (ref <1.0)

## 2019-04-04 LAB — GLUCOSE, CAPILLARY: Glucose-Capillary: 266 mg/dL — ABNORMAL HIGH (ref 70–99)

## 2019-04-04 LAB — HIV ANTIBODY (ROUTINE TESTING W REFLEX): HIV Screen 4th Generation wRfx: NONREACTIVE

## 2019-04-04 LAB — SEDIMENTATION RATE: Sed Rate: 76 mm/hr — ABNORMAL HIGH (ref 0–16)

## 2019-04-04 MED ORDER — ACETAMINOPHEN 650 MG RE SUPP: 650.0000 mg | Freq: Four times a day (QID) | RECTAL | Status: DC | PRN

## 2019-04-04 MED ORDER — INSULIN ASPART 100 UNIT/ML ~~LOC~~ SOLN
6.0000 [IU] | Freq: Once | SUBCUTANEOUS | Status: AC
  Administered 2019-04-04: 13:00:00 6 [IU] via SUBCUTANEOUS
  Filled 2019-04-04: qty 1

## 2019-04-04 MED ORDER — ACETAMINOPHEN 325 MG PO TABS
650.0000 mg | ORAL_TABLET | Freq: Four times a day (QID) | ORAL | Status: DC | PRN
  Administered 2019-04-04 – 2019-04-08 (×5): 650 mg via ORAL
  Filled 2019-04-04 (×5): qty 2

## 2019-04-04 MED ORDER — AMLODIPINE BESY-BENAZEPRIL HCL 5-10 MG PO CAPS: 1.0000 | ORAL_CAPSULE | Freq: Every day | ORAL | Status: DC

## 2019-04-04 MED ORDER — HYDRALAZINE HCL 20 MG/ML IJ SOLN
5.0000 mg | Freq: Four times a day (QID) | INTRAMUSCULAR | Status: DC | PRN
  Administered 2019-04-04: 20:00:00 5 mg via INTRAVENOUS
  Filled 2019-04-04: qty 1

## 2019-04-04 MED ORDER — CEFAZOLIN SODIUM-DEXTROSE 2-4 GM/100ML-% IV SOLN
2.0000 g | Freq: Three times a day (TID) | INTRAVENOUS | Status: DC
  Administered 2019-04-04 – 2019-04-05 (×2): 2 g via INTRAVENOUS
  Filled 2019-04-04 (×2): qty 100

## 2019-04-04 MED ORDER — INSULIN ASPART 100 UNIT/ML ~~LOC~~ SOLN
0.0000 [IU] | Freq: Every day | SUBCUTANEOUS | Status: DC
  Administered 2019-04-04: 21:00:00 3 [IU] via SUBCUTANEOUS
  Administered 2019-04-07 – 2019-04-08 (×2): 2 [IU] via SUBCUTANEOUS

## 2019-04-04 MED ORDER — DOXYCYCLINE HYCLATE 100 MG PO TABS
100.0000 mg | ORAL_TABLET | Freq: Two times a day (BID) | ORAL | Status: DC
  Administered 2019-04-04: 100 mg via ORAL
  Filled 2019-04-04 (×2): qty 1

## 2019-04-04 MED ORDER — HEPARIN SODIUM (PORCINE) 5000 UNIT/ML IJ SOLN
5000.0000 [IU] | Freq: Three times a day (TID) | INTRAMUSCULAR | Status: DC
  Administered 2019-04-04 – 2019-04-06 (×5): 5000 [IU] via SUBCUTANEOUS
  Filled 2019-04-04 (×5): qty 1

## 2019-04-04 MED ORDER — VANCOMYCIN HCL IN DEXTROSE 1-5 GM/200ML-% IV SOLN
1000.0000 mg | Freq: Once | INTRAVENOUS | Status: AC
  Administered 2019-04-04: 14:00:00 1000 mg via INTRAVENOUS
  Filled 2019-04-04: qty 200

## 2019-04-04 MED ORDER — INSULIN ASPART 100 UNIT/ML ~~LOC~~ SOLN: 0.0000 [IU] | Freq: Three times a day (TID) | SUBCUTANEOUS | Status: DC

## 2019-04-04 MED ORDER — BENAZEPRIL HCL 5 MG PO TABS
10.0000 mg | ORAL_TABLET | Freq: Every day | ORAL | Status: DC
  Administered 2019-04-06 – 2019-04-09 (×4): 10 mg via ORAL
  Filled 2019-04-04: qty 1
  Filled 2019-04-04 (×5): qty 2
  Filled 2019-04-04: qty 1

## 2019-04-04 MED ORDER — NICOTINE 21 MG/24HR TD PT24
21.0000 mg | MEDICATED_PATCH | Freq: Every day | TRANSDERMAL | Status: DC
  Administered 2019-04-06 – 2019-04-09 (×4): 21 mg via TRANSDERMAL
  Filled 2019-04-04 (×4): qty 1

## 2019-04-04 MED ORDER — AMLODIPINE BESYLATE 5 MG PO TABS
5.0000 mg | ORAL_TABLET | Freq: Every day | ORAL | Status: DC
  Administered 2019-04-06 – 2019-04-09 (×4): 5 mg via ORAL
  Filled 2019-04-04 (×4): qty 1

## 2019-04-04 NOTE — H&P (Signed)
TRH H&P   Patient Demographics:    Lewellyn Fultz, is a 64 y.o. male  MRN: [id number]   DOB - 04-Aug-1955  Admit Date - 04/04/2019  Outpatient Primary MD for the patient is Patient, No Pcp Per  Referring MD/NP/PA: PA Idol  Outpatient Specialists: Podiatry Dr Germaine Pomfret  Patient coming from: Home  Chief Complaint  Patient presents with  . Wound Infection      HPI:    Izzy Courville  is a 64 y.o. male, with past medical history of diabetes mellitus, hypertension, peripheral vascular disease, tobacco use, resents to ED secondary to worsening left great toe discoloration, as well he reports minimal pain, patient followed by podiatry Dr. Germaine Pomfret , patient with poor outpatient follow-up, patient denies any fever, chills, nausea, vomiting, denies any discharge, or foul smelling odor, denies any initial trauma. - IN ED patient was noted to be afebrile, with mildly elevated blood pressure, there was no leukocytosis, patient was started on broad-spectrum antibiotic, and I was consulted to admit for dry gangrene.    Review of systems:    In addition to the HPI above,  No Fever-chills, No Headache, No changes with Vision or hearing, No problems swallowing food or Liquids, No Chest pain, Cough or Shortness of Breath, No Abdominal pain, No Nausea or Vommitting, Bowel movements are regular, No Blood in stool or Urine, No dysuria, Worsening right great toe discoloration, reports mild pain No new joints pains-aches,  No new weakness, tingling, numbness in any extremity, No recent weight gain or loss, No polyuria, polydypsia or polyphagia, No significant Mental Stressors.  A full 10 point Review of Systems was done, except as stated above, all other Review of Systems were negative.   With Past History of the following :    Past Medical History:  Diagnosis Date  . Anxiety   . Diabetes  mellitus   . Hypertension       Past Surgical History:  Procedure Laterality Date  . HERNIA REPAIR        Social History:     Social History   Tobacco Use  . Smoking status: Current Every Day Smoker    Packs/day: 1.00  . Smokeless tobacco: Never Used  Substance Use Topics  . Alcohol use: Yes    Comment: occ       Family History :   Family history was reviewed, nonpertinent   Home Medications:   Prior to Admission medications   Medication Sig Start Date End Date Taking? Authorizing Provider  amLODipine-benazepril (LOTREL) 5-10 MG per capsule Take 1 capsule by mouth daily.   Yes [provider]  ibuprofen (ADVIL) 600 MG tablet Take 1 tablet (600 mg total) by mouth every 8 (eight) hours as needed for moderate pain. 01/07/19  Yes Idol, Raynelle Fanning, PA-C  doxycycline (VIBRA-TABS) 100 MG tablet Take 1 tablet (100 mg total) by mouth 2 (two) times daily.  Patient not taking: Reported on 04/04/2019 02/24/19   Trula Slade, DPM  gabapentin (NEURONTIN) 100 MG capsule Take 1 capsule (100 mg total) by mouth at bedtime. Patient not taking: Reported on 04/04/2019 03/10/19   Trula Slade, DPM  glipiZIDE (GLUCOTROL) 5 MG tablet Take 1 tablet (5 mg total) by mouth daily before breakfast. Patient not taking: Reported on 04/04/2019 01/07/19   Evalee Jefferson, PA-C  mupirocin ointment (BACTROBAN) 2 % Apply 1 application topically 2 (two) times daily. Patient not taking: Reported on 04/04/2019 03/03/19   Trula Slade, DPM     Allergies:    No Known Allergies   Physical Exam:   Vitals  Blood pressure (!) 147/91, pulse 76, temperature 98.6 F (37 C), temperature source Oral, resp. rate 18, height 6' (1.829 m), weight 69.4 kg, SpO2 100 %.   1. General developed male, laying in bed in no apparent distress 2. Normal affect and insight, Not Suicidal or Homicidal, Awake Alert, Oriented X 3.  3. No F.N deficits, ALL C.Nerves Intact, Strength 5/5 all 4 extremities, Sensation  intact all 4 extremities, Plantars down going.  4. Ears and Eyes appear Normal, Conjunctivae clear, PERRLA. Moist Oral Mucosa.  5. Supple Neck, No JVD, No cervical lymphadenopathy appriciated, No Carotid Bruits.  6. Symmetrical Chest wall movement, Good air movement bilaterally, CTAB.  7. RRR, No Gallops, Rubs or Murmurs, No Parasternal Heave.  8. Positive Bowel Sounds, Abdomen Soft, No tenderness, No organomegaly appriciated,No rebound -guarding or rigidity.  9.  See pictures below regarding right great toe dry gangrene surrounding cellulitis, and left second toe, patient with intact DP and PT pulses with Doppler monophasic only  10. Good muscle tone,  joints appear normal , no effusions, Normal ROM.  11. No Palpable Lymph Nodes in Neck or Axillae        Data Review:    CBC Recent Labs  Lab 04/04/19 0954  WBC 7.5  HGB 13.6  HCT 42.3  PLT 247  MCV 85.8  MCH 27.6  MCHC 32.2  RDW 11.6  LYMPHSABS 1.6  MONOABS 0.5  EOSABS 0.1  BASOSABS 0.0   ------------------------------------------------------------------------------------------------------------------  Chemistries  Recent Labs  Lab 04/04/19 0954  NA 132*  K 4.3  CL 96*  CO2 25  GLUCOSE 321*  BUN 13  CREATININE 1.04  CALCIUM 9.4   ------------------------------------------------------------------------------------------------------------------ estimated creatinine clearance is 69.5 mL/min (by C-G formula based on SCr of 1.04 mg/dL). ------------------------------------------------------------------------------------------------------------------ No results for input(s): TSH, T4TOTAL, T3FREE, THYROIDAB in the last 72 hours.  Invalid input(s): FREET3  Coagulation profile No results for input(s): INR, PROTIME in the last 168 hours. ------------------------------------------------------------------------------------------------------------------- No results for input(s): DDIMER in the last 72  hours. -------------------------------------------------------------------------------------------------------------------  Cardiac Enzymes No results for input(s): CKMB, TROPONINI, MYOGLOBIN in the last 168 hours.  Invalid input(s): CK ------------------------------------------------------------------------------------------------------------------ No results found for: BNP   ---------------------------------------------------------------------------------------------------------------  Urinalysis    Component Value Date/Time   COLORURINE YELLOW 04/16/2011 1908   APPEARANCEUR CLEAR 04/16/2011 1908   LABSPEC 1.015 04/16/2011 1908   PHURINE 6.0 04/16/2011 1908   GLUCOSEU NEGATIVE 04/16/2011 1908   HGBUR NEGATIVE 04/16/2011 1908   BILIRUBINUR NEGATIVE 04/16/2011 1908   KETONESUR NEGATIVE 04/16/2011 1908   PROTEINUR NEGATIVE 04/16/2011 1908   UROBILINOGEN 0.2 04/16/2011 1908   NITRITE NEGATIVE 04/16/2011 1908   LEUKOCYTESUR NEGATIVE 04/16/2011 1908    ----------------------------------------------------------------------------------------------------------------   Imaging Results:    DG Foot Complete Right  Result Date: 04/04/2019 CLINICAL DATA:  Right foot pain with great toe  ulcer. EXAM: RIGHT FOOT COMPLETE - 3+ VIEW COMPARISON:  None. FINDINGS: Soft tissue defect noted great toe, compatible with the reported history of ulcer. No underlying bony erosion/destruction in the phalanges of the great toe to suggest osteomyelitis. No evidence for fracture or dislocation. Mild degenerative changes noted MTP joint great toe. IMPRESSION: Soft tissue ulcer noted in the great toe without underlying bony destruction to suggest osteomyelitis by x-ray. Electronically Signed   By: Kennith Center M.D.   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 -He will be admitted to the hospital, as he will need vascular surgery consult for evaluation, and likely he will need amputation. -We will order ABI. -We will consult vascular surgery. -Podiatry consulted by ED.  Diabetes mellitus -Currently patient with poor compliance, reports he is not taking any home medications for diabetes -Check A1c -Start on insulin sliding scale  Hypertension -Continue with Lotrel -We will add as needed hydralazine  Tobacco abuse -He was counseled, will start on nicotine patch   DVT Prophylaxis Heparin  AM Labs Ordered, also please review Full Orders  Family Communication: Admission, patients condition and plan of care including tests being ordered have been discussed with the patient who indicate understanding and agree with the plan and Code Status.  Code Status full code  Likely DC to home  Condition GUARDED   Consults called: Podiatry consulted by ED, will consult vascular  Admission status: Inpatient  Time spent in minutes : 55 minutes   Huey Bienenstock M.D on 04/04/2019 at 3:52 PM  Between 7am to 7pm - Pager - [phone]. After 7pm go to [email or web address] - password [facility]  [facility] - Office  [phone]

## 2019-04-04 NOTE — ED Provider Notes (Addendum)
[facility] EMERGENCY DEPARTMENT Provider Note   CSN: [id number] Arrival date & time: 04/04/19  5284     History Chief Complaint  Patient presents with  . Wound Infection    Juan Turner is a 64 y.o. male with a history of DM, HTN and peripheral arterial disease presenting with darkening of his left great toe which he states started after his last nail trim by his podiatrist Dr. Ardelle Anton in [place].  Pt denies pain at the site and has had no drainage either, stating he has numbness in his feet. He is a diabetic, denies missing any doses of his DM medications.    The history is provided by the patient.       Past Medical History:  Diagnosis Date  . Anxiety   . Diabetes mellitus   . Hypertension     Patient Active Problem List   Diagnosis Date Noted  . 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 Essential hypertension 04/04/2019    Past Surgical History:  Procedure Laterality Date  . HERNIA REPAIR         No family history on file.  Social History   Tobacco Use  . Smoking status: Current Every Day Smoker    Packs/day: 1.00  . Smokeless tobacco: Never Used  Substance Use Topics  . Alcohol use: Yes    Comment: occ  . Drug use: Never    Home Medications Prior to Admission medications   Medication Sig Start Date End Date Taking? Authorizing Provider  amLODipine-benazepril (LOTREL) 5-10 MG per capsule Take 1 capsule by mouth daily.   Yes [provider]  ibuprofen (ADVIL) 600 MG tablet Take 1 tablet (600 mg total) by mouth every 8 (eight) hours as needed for moderate pain. 01/07/19  Yes Zakaiya Lares, Raynelle Fanning, PA-C  doxycycline (VIBRA-TABS) 100 MG tablet Take 1 tablet (100 mg total) by mouth 2 (two) times daily. Patient not taking: Reported on 04/04/2019 02/24/19   Vivi Barrack, DPM  gabapentin (NEURONTIN) 100 MG capsule Take 1 capsule (100 mg total) by mouth at bedtime. Patient not taking: Reported on  04/04/2019 03/10/19   Vivi Barrack, DPM  glipiZIDE (GLUCOTROL) 5 MG tablet Take 1 tablet (5 mg total) by mouth daily before breakfast. Patient not taking: Reported on 04/04/2019 01/07/19   Burgess Amor, PA-C  mupirocin ointment (BACTROBAN) 2 % Apply 1 application topically 2 (two) times daily. Patient not taking: Reported on 04/04/2019 03/03/19   Vivi Barrack, DPM    Allergies    Patient has no known allergies.  Review of Systems   Review of Systems  Constitutional: Negative for fever.  HENT: Negative for congestion and sore throat.   Eyes: Negative.   Respiratory: Negative for chest tightness and shortness of breath.   Cardiovascular: Negative for chest pain.  Gastrointestinal: Negative for abdominal pain and nausea.  Genitourinary: Negative.   Musculoskeletal: Negative for arthralgias, joint swelling and neck pain.  Skin: Positive for color change and wound. Negative for rash.  Neurological: Negative for dizziness, weakness, light-headedness, numbness and headaches.  Psychiatric/Behavioral: Negative.     Physical Exam Updated Vital Signs BP (!) 147/91   Pulse 76   Temp 98.6 F (37 C) (Oral)   Resp 18   Ht 6' (1.829 m)   Wt 69.4 kg   SpO2 100%   BMI 20.75 kg/m   Physical Exam Vitals and nursing note reviewed.  Constitutional:  Appearance: He is well-developed.  HENT:     Head: Normocephalic and atraumatic.  Eyes:     Conjunctiva/sclera: Conjunctivae normal.  Cardiovascular:     Rate and Rhythm: Normal rate and regular rhythm.     Pulses:          Dorsalis pedis pulses are detected w/ Doppler on the right side.       Posterior tibial pulses are 2+ on the left side.     Heart sounds: Normal heart sounds.  Pulmonary:     Effort: Pulmonary effort is normal.     Breath sounds: Normal breath sounds. No wheezing.  Abdominal:     General: Bowel sounds are normal.     Palpations: Abdomen is soft.     Tenderness: There is no abdominal tenderness.    Musculoskeletal:        General: Normal range of motion.     Comments: Refer to imaging below.  Dry gangrene of the right great toe.  There is no red streaking or erythema proximal to the toe.  Nontender.  Left second toe per imaging also has suggestion of arterial insufficiency.  There is no open wounds of this toe.  Skin:    General: Skin is warm and dry.  Neurological:     Mental Status: He is alert.              ED Results / Procedures / Treatments   Labs (all labs ordered are listed, but only abnormal results are displayed) Labs Reviewed  BASIC METABOLIC PANEL - Abnormal; Notable for the following components:      Result Value   Sodium 132 (*)    Chloride 96 (*)    Glucose, Bld 321 (*)    All other components within normal limits  C-REACTIVE PROTEIN - Abnormal; Notable for the following components:   CRP 5.4 (*)    All other components within normal limits  SEDIMENTATION RATE - Abnormal; Notable for the following components:   Sed Rate 76 (*)    All other components within normal limits  CBG MONITORING, ED - Abnormal; Notable for the following components:   Glucose-Capillary 226 (*)    All other components within normal limits  CBC WITH DIFFERENTIAL/PLATELET    EKG None  Radiology DG Foot Complete Right  Result Date: 04/04/2019 CLINICAL DATA:  Right foot pain with great toe ulcer. EXAM: RIGHT FOOT COMPLETE - 3+ VIEW COMPARISON:  None. FINDINGS: Soft tissue defect noted great toe, compatible with the reported history of ulcer. No underlying bony erosion/destruction in the phalanges of the great toe to suggest osteomyelitis. No evidence for fracture or dislocation. Mild degenerative changes noted MTP joint great toe. IMPRESSION: Soft tissue ulcer noted in the great toe without underlying bony destruction to suggest osteomyelitis by x-ray. Electronically Signed   By: Misty Stanley M.D.   On: 04/04/2019 09:54    Procedures Procedures (including critical care  time)  Medications Ordered in ED Medications  insulin aspart (novoLOG) injection 6 Units (6 Units Subcutaneous Given 04/04/19 1236)  vancomycin (VANCOCIN) IVPB 1000 mg/200 mL premix (1,000 mg Intravenous New Bag/Given 04/04/19 1407)    ED Course  I have reviewed the triage vital signs and the nursing notes.  Pertinent labs & imaging results that were available during my care of the patient were reviewed by me and considered in my medical decision making (see chart for details).    MDM Rules/Calculators/A&P  Patient with dry gangrene of the right great toe.  He has a history of arterial insufficiency and is under the care of Dr. Ardelle Anton.  The current plan was to get out patient vascular studies which is still pending.  Discussed care with Dr. Ardelle Anton who agrees to consult on this patient has asked for a medical admission for this patient.  He recommended vascular studies as an inpatient since there has been some difficulty getting these procedures arranged on an outpatient basis.  He recommended starting IV vancomycin while he is here.  Call to the hospital group.  Discussed with hospitalist Dr.Elgergawy who will see and admit pt.  Final Clinical Impression(s) / ED Diagnoses Final diagnoses:  Dry gangrene [facility])  Vascular insufficiency of extremity    Rx / DC Orders ED Discharge Orders    None       Victoriano Lain 04/04/19 1519    Burgess Amor, PA-C 04/04/19 1521    Glynn Octave, MD 04/04/19 1537

## 2019-04-04 NOTE — ED Triage Notes (Signed)
Pt reports has been seeing a podiatrist for wound to left 2nd toe.  Reports recently went to podiatrist for wound to r great toe.  Reports they cut off some of the toenail.  Pt unable to recall when he went to the doctor but says his r great toe has been black since then.   Pt says he feels a "sting" in his r great toe.  Pt's daughter in the car at this time and can answer further questions.  Phone number is [phone]

## 2019-04-04 NOTE — Progress Notes (Signed)
Pharmacy Antibiotic Note  Juan Turner is a 64 y.o. male admitted on 04/04/2019 with wound infection/dry gangrene.  Pharmacy has been consulted for Cefazolin dosing.  Plan: Cefazolin 2000 mg IV every 8 hours. Monitor labs, c/s, and patient improvement.  Height: 6' (182.9 cm) Weight: 153 lb (69.4 kg) IBW/kg (Calculated) : 77.6  Temp (24hrs), Avg:98.6 F (37 C), Min:98.6 F (37 C), Max:98.6 F (37 C)  Recent Labs  Lab 04/04/19 0954  WBC 7.5  CREATININE 1.04    Estimated Creatinine Clearance: 69.5 mL/min (by C-G formula based on SCr of 1.04 mg/dL).    No Known Allergies  Antimicrobials this admission: Cefazolin 2/22 >>  Doxy 2/22 >>    Microbiology results: COVID: pending  Thank you for allowing pharmacy to be a part of this patient's care.  Tad Moore 04/04/2019 4:06 PM

## 2019-04-04 NOTE — Progress Notes (Addendum)
Received a call from [facility]. Patient presents with gangrene to the right hallux. I had originally seen the patient for a wound and changes to the left 2nd toe. He later presented with drainage, bleeding of the right hallux toenail and the nail was very loose. It appeared at the time he may have hit the nail on something but he didn't recall anything. I had trimmed the loose nail to avoid it getting pulled off but did NOT cut the skin and there was already an ulcer at that time under the nail. I had tried to get him into vascular and he cancelled his appointment that was scheduled for 03/25/2019. He also has cancelled the last 2 appointment with me. I am concerned that if he is discharged he is not going to follow up. Recommenced admission and transfer to [facility] and I will come see him. He also needs a vascular surgery consult.   Ovid Curd, DPM O: [phone] C: [phone]

## 2019-04-05 ENCOUNTER — Encounter (HOSPITAL_COMMUNITY)
Admission: EM | Disposition: A | Discharge status: Home Health | Payer: Self-pay | Source: Home / Self Care | Attending: Internal Medicine

## 2019-04-05 ENCOUNTER — Inpatient Hospital Stay (HOSPITAL_COMMUNITY): Payer: Medicare Other

## 2019-04-05 ENCOUNTER — Ambulatory Visit (HOSPITAL_COMMUNITY)
Admission: RE | Admit: 2019-04-05 | Payer: Medicare Other | Source: Ambulatory Visit | Attending: Podiatry | Admitting: Podiatry

## 2019-04-05 DIAGNOSIS — Z01810 Encounter for preprocedural cardiovascular examination: Secondary | ICD-10-CM

## 2019-04-05 DIAGNOSIS — E1152 Type 2 diabetes mellitus with diabetic peripheral angiopathy with gangrene: Secondary | ICD-10-CM | POA: Diagnosis not present

## 2019-04-05 DIAGNOSIS — L97509 Non-pressure chronic ulcer of other part of unspecified foot with unspecified severity: Secondary | ICD-10-CM

## 2019-04-05 DIAGNOSIS — E11621 Type 2 diabetes mellitus with foot ulcer: Secondary | ICD-10-CM | POA: Diagnosis not present

## 2019-04-05 DIAGNOSIS — I739 Peripheral vascular disease, unspecified: Secondary | ICD-10-CM | POA: Diagnosis not present

## 2019-04-05 DIAGNOSIS — I96 Gangrene, not elsewhere classified: Secondary | ICD-10-CM

## 2019-04-05 DIAGNOSIS — I10 Essential (primary) hypertension: Secondary | ICD-10-CM | POA: Diagnosis not present

## 2019-04-05 DIAGNOSIS — L97519 Non-pressure chronic ulcer of other part of right foot with unspecified severity: Secondary | ICD-10-CM

## 2019-04-05 HISTORY — PX: ABDOMINAL AORTOGRAM W/LOWER EXTREMITY: CATH118223

## 2019-04-05 LAB — CBC
HCT: 37.1 % — ABNORMAL LOW (ref 39.0–52.0)
Hemoglobin: 12.4 g/dL — ABNORMAL LOW (ref 13.0–17.0)
MCH: 27.7 pg (ref 26.0–34.0)
MCHC: 33.4 g/dL (ref 30.0–36.0)
MCV: 82.8 fL (ref 80.0–100.0)
Platelets: 271 10*3/uL (ref 150–400)
RBC: 4.48 MIL/uL (ref 4.22–5.81)
RDW: 11.5 % (ref 11.5–15.5)
WBC: 7.6 10*3/uL (ref 4.0–10.5)
nRBC: 0 % (ref 0.0–0.2)

## 2019-04-05 LAB — BASIC METABOLIC PANEL
Anion gap: 14 (ref 5–15)
BUN: 10 mg/dL (ref 8–23)
CO2: 22 mmol/L (ref 22–32)
Calcium: 9 mg/dL (ref 8.9–10.3)
Chloride: 98 mmol/L (ref 98–111)
Creatinine, Ser: 0.85 mg/dL (ref 0.61–1.24)
GFR calc Af Amer: >60 mL/min (ref >60)
GFR calc non Af Amer: >60 mL/min (ref >60)
Glucose, Bld: 160 mg/dL — ABNORMAL HIGH (ref 70–99)
Potassium: 3.6 mmol/L (ref 3.5–5.1)
Sodium: 134 mmol/L — ABNORMAL LOW (ref 135–145)

## 2019-04-05 LAB — GLUCOSE, CAPILLARY
Glucose-Capillary: 154 mg/dL — ABNORMAL HIGH (ref 70–99)
Glucose-Capillary: 145 mg/dL — ABNORMAL HIGH (ref 70–99)
Glucose-Capillary: 258 mg/dL — ABNORMAL HIGH (ref 70–99)
Glucose-Capillary: 222 mg/dL — ABNORMAL HIGH (ref 70–99)
Glucose-Capillary: 124 mg/dL — ABNORMAL HIGH (ref 70–99)

## 2019-04-05 LAB — SARS CORONAVIRUS 2 (TAT 6-24 HRS): SARS Coronavirus 2: NEGATIVE

## 2019-04-05 SURGERY — ABDOMINAL AORTOGRAM W/LOWER EXTREMITY
Anesthesia: LOCAL

## 2019-04-05 MED ORDER — IODIXANOL 320 MG/ML IV SOLN
INTRAVENOUS | Status: DC | PRN
  Administered 2019-04-05: 225 mL via INTRA_ARTERIAL

## 2019-04-05 MED ORDER — SODIUM CHLORIDE 0.9 % IV SOLN: INTRAVENOUS | Status: DC

## 2019-04-05 MED ORDER — PIPERACILLIN-TAZOBACTAM 3.375 G IVPB
3.3750 g | Freq: Three times a day (TID) | INTRAVENOUS | Status: DC
  Administered 2019-04-05 – 2019-04-09 (×11): 3.375 g via INTRAVENOUS
  Filled 2019-04-05 (×9): qty 50

## 2019-04-05 MED ORDER — SODIUM CHLORIDE 0.9 % IV SOLN: 250.0000 mL | INTRAVENOUS | Status: DC | PRN

## 2019-04-05 MED ORDER — SODIUM CHLORIDE 0.9% FLUSH: 3.0000 mL | INTRAVENOUS | Status: DC | PRN

## 2019-04-05 MED ORDER — HYDRALAZINE HCL 20 MG/ML IJ SOLN
INTRAMUSCULAR | Status: AC
  Filled 2019-04-05: qty 1

## 2019-04-05 MED ORDER — LABETALOL HCL 5 MG/ML IV SOLN: 10.0000 mg | INTRAVENOUS | Status: DC | PRN

## 2019-04-05 MED ORDER — ROSUVASTATIN CALCIUM 5 MG PO TABS
10.0000 mg | ORAL_TABLET | Freq: Every day | ORAL | Status: DC
Start: 2019-04-05 — End: 2019-04-10
  Administered 2019-04-05 – 2019-04-09 (×5): 10 mg via ORAL
  Filled 2019-04-05 (×5): qty 2

## 2019-04-05 MED ORDER — ONDANSETRON HCL 4 MG/2ML IJ SOLN: 4.0000 mg | Freq: Four times a day (QID) | INTRAMUSCULAR | Status: DC | PRN

## 2019-04-05 MED ORDER — HEPARIN (PORCINE) IN NACL 1000-0.9 UT/500ML-% IV SOLN
INTRAVENOUS | Status: AC
  Filled 2019-04-05: qty 500

## 2019-04-05 MED ORDER — MIDAZOLAM HCL 2 MG/2ML IJ SOLN
INTRAMUSCULAR | Status: DC | PRN
  Administered 2019-04-05: 2 mg via INTRAVENOUS

## 2019-04-05 MED ORDER — HYDRALAZINE HCL 20 MG/ML IJ SOLN: 5.0000 mg | INTRAMUSCULAR | Status: DC | PRN

## 2019-04-05 MED ORDER — FENTANYL CITRATE (PF) 100 MCG/2ML IJ SOLN
INTRAMUSCULAR | Status: DC | PRN
  Administered 2019-04-05: 25 ug via INTRAVENOUS

## 2019-04-05 MED ORDER — LIDOCAINE HCL (PF) 1 % IJ SOLN
INTRAMUSCULAR | Status: DC | PRN
  Administered 2019-04-05: 20 mL via INTRADERMAL

## 2019-04-05 MED ORDER — LABETALOL HCL 5 MG/ML IV SOLN
INTRAVENOUS | Status: AC
  Filled 2019-04-05: qty 4

## 2019-04-05 MED ORDER — FENTANYL CITRATE (PF) 100 MCG/2ML IJ SOLN
INTRAMUSCULAR | Status: AC
  Filled 2019-04-05: qty 2

## 2019-04-05 MED ORDER — LABETALOL HCL 5 MG/ML IV SOLN
INTRAVENOUS | Status: DC | PRN
  Administered 2019-04-05: 10 mg via INTRAVENOUS

## 2019-04-05 MED ORDER — HYDRALAZINE HCL 20 MG/ML IJ SOLN
INTRAMUSCULAR | Status: DC | PRN
  Administered 2019-04-05: 10 mg via INTRAVENOUS

## 2019-04-05 MED ORDER — ASPIRIN EC 81 MG PO TBEC
81.0000 mg | DELAYED_RELEASE_TABLET | Freq: Every day | ORAL | Status: DC
Start: 2019-04-05 — End: 2019-04-10
  Administered 2019-04-05 – 2019-04-09 (×5): 81 mg via ORAL
  Filled 2019-04-05 (×5): qty 1

## 2019-04-05 MED ORDER — MIDAZOLAM HCL 2 MG/2ML IJ SOLN
INTRAMUSCULAR | Status: AC
  Filled 2019-04-05: qty 2

## 2019-04-05 MED ORDER — VANCOMYCIN HCL IN DEXTROSE 1-5 GM/200ML-% IV SOLN
1000.0000 mg | Freq: Once | INTRAVENOUS | Status: AC
  Administered 2019-04-05: 10:00:00 1000 mg via INTRAVENOUS
  Filled 2019-04-05: qty 200

## 2019-04-05 MED ORDER — SODIUM CHLORIDE 0.9 % IV SOLN: INTRAVENOUS | Status: AC

## 2019-04-05 MED ORDER — LIDOCAINE HCL (PF) 1 % IJ SOLN
INTRAMUSCULAR | Status: AC
  Filled 2019-04-05: qty 30

## 2019-04-05 MED ORDER — HEPARIN (PORCINE) IN NACL 1000-0.9 UT/500ML-% IV SOLN
INTRAVENOUS | Status: DC | PRN
  Administered 2019-04-05 (×2): 500 mL

## 2019-04-05 MED ORDER — MORPHINE SULFATE (PF) 2 MG/ML IV SOLN: 2.0000 mg | INTRAVENOUS | Status: DC | PRN

## 2019-04-05 MED ORDER — PIPERACILLIN-TAZOBACTAM 3.375 G IVPB 30 MIN
3.3750 g | Freq: Once | INTRAVENOUS | Status: AC
  Administered 2019-04-05: 3.375 g via INTRAVENOUS
  Filled 2019-04-05: qty 50

## 2019-04-05 MED ORDER — INSULIN ASPART 100 UNIT/ML ~~LOC~~ SOLN
0.0000 [IU] | Freq: Three times a day (TID) | SUBCUTANEOUS | Status: DC
  Administered 2019-04-05 – 2019-04-06 (×2): 5 [IU] via SUBCUTANEOUS
  Administered 2019-04-06: 3 [IU] via SUBCUTANEOUS
  Administered 2019-04-06 – 2019-04-08 (×3): 2 [IU] via SUBCUTANEOUS
  Administered 2019-04-08 (×2): 3 [IU] via SUBCUTANEOUS
  Administered 2019-04-09: 18:00:00 2 [IU] via SUBCUTANEOUS
  Administered 2019-04-09 (×2): 3 [IU] via SUBCUTANEOUS

## 2019-04-05 MED ORDER — VANCOMYCIN HCL IN DEXTROSE 1-5 GM/200ML-% IV SOLN
1000.0000 mg | Freq: Two times a day (BID) | INTRAVENOUS | Status: DC
  Administered 2019-04-05 – 2019-04-07 (×4): 1000 mg via INTRAVENOUS
  Filled 2019-04-05 (×5): qty 200

## 2019-04-05 MED ORDER — OXYCODONE HCL 5 MG PO TABS
5.0000 mg | ORAL_TABLET | ORAL | Status: DC | PRN
  Administered 2019-04-06 (×2): 5 mg via ORAL
  Administered 2019-04-07 – 2019-04-09 (×4): 10 mg via ORAL
  Filled 2019-04-05: qty 1
  Filled 2019-04-05 (×3): qty 2
  Filled 2019-04-05: qty 1
  Filled 2019-04-05: qty 2

## 2019-04-05 MED ORDER — SODIUM CHLORIDE 0.9% FLUSH
3.0000 mL | Freq: Two times a day (BID) | INTRAVENOUS | Status: DC
  Administered 2019-04-07 – 2019-04-09 (×4): 3 mL via INTRAVENOUS

## 2019-04-05 SURGICAL SUPPLY — 13 items
CATH ANGIO 5F PIGTAIL 65CM (CATHETERS) ×1 IMPLANT
CATH BEACON 5 .035 100 JB1 TIP (CATHETERS) IMPLANT
CATH CROSS OVER TEMPO 5F (CATHETERS) ×1 IMPLANT
CATH STRAIGHT 5FR 65CM (CATHETERS) ×1 IMPLANT
KIT PV (KITS) ×2 IMPLANT
SHEATH PINNACLE 5F 10CM (SHEATH) ×1 IMPLANT
SHEATH PROBE COVER 6X72 (BAG) ×1 IMPLANT
STOPCOCK MORSE 400PSI 3WAY (MISCELLANEOUS) ×1 IMPLANT
SYR MEDRAD MARK 7 150ML (SYRINGE) ×2 IMPLANT
TRANSDUCER W/STOPCOCK (MISCELLANEOUS) ×2 IMPLANT
TRAY PV CATH (CUSTOM PROCEDURE TRAY) ×2 IMPLANT
TUBING CIL FLEX 10 FLL-RA (TUBING) ×1 IMPLANT
WIRE BENTSON .035X145CM (WIRE) ×1 IMPLANT

## 2019-04-05 NOTE — Progress Notes (Signed)
Site area: left groin fa sheath Site Prior to Removal:  Level 0 Pressure Applied For: 20 minutes Manual:   yes Patient Status During Pull:  stable Post Pull Site:  Level 0 Post Pull Instructions Given:  yes Post Pull Pulses Present: left pt dopplered Dressing Applied:  Gauze and tegaderm Bedrest begins @ 0940 Comments:

## 2019-04-05 NOTE — Progress Notes (Signed)
BP low. Patient feels whoozy. Warm and dry.  HOB down; 250cc 0.9NS started. Left groin level 0

## 2019-04-05 NOTE — Plan of Care (Signed)
Continue to monitor

## 2019-04-05 NOTE — Op Note (Addendum)
Procedure: Abdominal aortogram with bilateral lower extremity runoff, second order catheterization right external iliac artery, retrograde left common femoral puncture, ultrasound left groin  Preoperative diagnosis: Gangrene right first toe left second toe  Postoperative diagnosis: Same  Anesthesia: Local with IV sedation  Sedation time: 51 minutes  Operative findings: 1.  Bilateral popliteal artery occlusion  2.  Bilateral one-vessel posterior tibial artery runoff  Operative details: After team informed consent, the patient and the PV lab.  The patient was placed in supine position angio table.  Both groins were prepped and draped in usual sterile fashion.  Local anesthesia was infiltrated over the left common femoral artery.  Ultrasound was used to identify the left common femoral artery and femoral bifurcation.  Using ultrasound guidance an introducer needle was used to cannulate the left common femoral artery and an 035 versa core wire threaded up the abdominal aorta under fluoroscopic guidance.  Next a 5 French sheath was placed over the guidewire in the left common femoral artery.  This was thoroughly flushed with heparinized saline.  5 French pigtail catheter was then advanced over the guidewire in the abdominal aorta and abdominal aortogram was obtained in AP projection.  The left and right renal arteries are patent.  The infrarenal abdominal aorta is widely patent with no flow-limiting stenosis.  There is mild calcification.  The left and right common external and internal iliac arteries are all patent.  Next the pigtail catheter is pulled down just above the aortic bifurcation and a pelvic angiogram obtained to further clarify the above findings.  Left and right common femoral artery is also patent.  Next bilateral lower extremity runoff views were obtained through the pigtail catheter.  In the left lower extremity, the left common femoral profunda femoris and superficial femoral arteries  are all patent.  The popliteal artery occludes above the knee.  It then reconstitutes is a very diseased below-knee popliteal artery and a diminutive peroneal artery.  The origins of the anterior tibial and posterior tibial artery are occluded.  The posterior tibial artery then reconstitutes and is single-vessel runoff to the left foot.  Additional stationary views were obtained from the below-knee segment of the foot including the lateral foot view to further clarify the anatomy on the left side.  In the right lower extremity there are similar findings with one-vessel posterior tibial artery runoff a very diseased popliteal artery there is a diminutive peroneal artery and the anterior tibial artery occludes in the proximal leg.  At this point the pigtail catheter was removed.  A 5 French crossover catheter was exchanged over the guidewire and the right common iliac followed by the right external iliac artery selected and the crossover catheter swapped out for 5 French straight catheter.  Additional lower extremity views on the right side were made to further clarify the below-knee popliteal and tibial vessels as well as lateral foot view.  Patient tired procedure well and there were no complications.  Patient was taken to the holding area in stable condition after the straight catheter was removed.  The 5 French sheath was left in place to be pulled the holding area.  Operative management: Patient will be scheduled in the near future for a right femoral to posterior tibial artery bypass with the proximal target site determined by adequacy of venous conduit.  The patient will also need amputation of his first toe.  He will be maintained on vancomycin and Zosyn antibiotic coverage until the time of his bypass procedure.  He  will be started on aspirin 81 mg once daily and Crestor 10 mg once daily for long-term management of his peripheral arterial disease.  Fabienne Bruns, MD [facility] [place] Office: [phone]

## 2019-04-05 NOTE — Progress Notes (Signed)
Patient arrived to 4E room 18 at this time. Telemetry applied and CCMD notified. CHG bath done. V/s and assessment complete. Left groin site clean dry and intact. Patient oriented to room and how to call nurse with any needs.

## 2019-04-05 NOTE — Consult Note (Addendum)
Referring Physician: [facility]  Patient name: Juan Turner MRN: [id number] DOB: Jun 20, 1955 Sex: male  REASON FOR CONSULT: gangrene toes  HPI: Juan Turner is a 64 y.o. male, with a several week history of slowly worsening infection in left and right foot.  He was originally seen by Dr Ardelle Anton in December and non invasive vascular studies ordered as well as vascular surgery appointment but patient never showed up for the appointment.  He presented to [facility] yesterday with worsening cellulitis and gangrene of right first toe.  He has also had darkening of one toe on the left foot.  He is a one PPD smoker with poorly controlled diabetes.  He is ambulatory.  He is currently on Ancef for antibiotic coverage.  Past Medical History:  Diagnosis Date  . Anxiety   . Diabetes mellitus   . Hypertension    Past Surgical History:  Procedure Laterality Date  . HERNIA REPAIR      No family history on file.  SOCIAL HISTORY: Social History   Socioeconomic History  . Marital status: Married    Spouse name: Not on file  . Number of children: Not on file  . Years of education: Not on file  . Highest education level: Not on file  Occupational History  . Not on file  Tobacco Use  . Smoking status: Current Every Day Smoker    Packs/day: 1.00  . Smokeless tobacco: Never Used  Substance and Sexual Activity  . Alcohol use: Yes    Comment: occ  . Drug use: Never  . Sexual activity: Not on file  Other Topics Concern  . Not on file  Social History Narrative  . Not on file   Social Determinants of Health   Financial Resource Strain:   . Difficulty of Paying Living Expenses: Not on file  Food Insecurity:   . Worried About Programme researcher, broadcasting/film/video in the Last Year: Not on file  . Ran Out of Food in the Last Year: Not on file  Transportation Needs:   . Lack of Transportation (Medical): Not on file  . Lack of Transportation (Non-Medical): Not on file  Physical Activity:   .  Days of Exercise per Week: Not on file  . Minutes of Exercise per Session: Not on file  Stress:   . Feeling of Stress : Not on file  Social Connections:   . Frequency of Communication with Friends and Family: Not on file  . Frequency of Social Gatherings with Friends and Family: Not on file  . Attends Religious Services: Not on file  . Active Member of Clubs or Organizations: Not on file  . Attends Banker Meetings: Not on file  . Marital Status: Not on file  Intimate Partner Violence:   . Fear of Current or Ex-Partner: Not on file  . Emotionally Abused: Not on file  . Physically Abused: Not on file  . Sexually Abused: Not on file    No Known Allergies  Current Facility-Administered Medications  Medication Dose Route Frequency Provider Last Rate Last Admin  . 0.9 %  sodium chloride infusion   Intravenous Continuous Sherren Kerns, MD      . acetaminophen (TYLENOL) tablet 650 mg  650 mg Oral Q6H PRN Elgergawy, Leana Roe, MD   650 mg at 04/04/19 2016   Or  . acetaminophen (TYLENOL) suppository 650 mg  650 mg Rectal Q6H PRN Elgergawy, Leana Roe, MD      . amLODipine (  NORVASC) tablet 5 mg  5 mg Oral Daily Elgergawy, Leana Roe, MD       And  . benazepril (LOTENSIN) tablet 10 mg  10 mg Oral Daily Elgergawy, Leana Roe, MD      . ceFAZolin (ANCEF) IVPB 2g/100 mL premix  2 g Intravenous Q8H Elgergawy, Leana Roe, MD 200 mL/hr at 04/05/19 0402 2 g at 04/05/19 0402  . doxycycline (VIBRA-TABS) tablet 100 mg  100 mg Oral Q12H Elgergawy, Leana Roe, MD   100 mg at 04/04/19 1727  . heparin injection 5,000 Units  5,000 Units Subcutaneous Q8H Elgergawy, Leana Roe, MD   5,000 Units at 04/04/19 2014  . hydrALAZINE (APRESOLINE) injection 5 mg  5 mg Intravenous Q6H PRN Elgergawy, Leana Roe, MD   5 mg at 04/04/19 2016  . insulin aspart (novoLOG) injection 0-5 Units  0-5 Units Subcutaneous QHS Elgergawy, Leana Roe, MD   3 Units at 04/04/19 2107  . insulin aspart (novoLOG) injection 0-9 Units  0-9  Units Subcutaneous TID WC Elgergawy, Leana Roe, MD      . nicotine (NICODERM CQ - dosed in mg/24 hours) patch 21 mg  21 mg Transdermal Daily Elgergawy, Leana Roe, MD        ROS:   General:  No weight loss, Fever, chills  HEENT: No recent headaches, no nasal bleeding, no visual changes, no sore throat  Neurologic: No dizziness, blackouts, seizures. No recent symptoms of stroke or mini- stroke. No recent episodes of slurred speech, or temporary blindness.  Cardiac: No recent episodes of chest pain/pressure, no shortness of breath at rest.  No shortness of breath with exertion.  Denies history of atrial fibrillation or irregular heartbeat  Vascular: No history of rest pain in feet.  No history of claudication.  + history of non-healing ulcer, No history of DVT   Pulmonary: No home oxygen, no productive cough, no hemoptysis,  No asthma or wheezing  Musculoskeletal:  [ ]  Arthritis, [ ]  Low back pain,  [ ]  Joint pain  Hematologic:No history of hypercoagulable state.  No history of easy bleeding.  No history of anemia  Gastrointestinal: No hematochezia or melena,  No gastroesophageal reflux, no trouble swallowing  Urinary: [ ]  chronic Kidney disease, [ ]  on HD - [ ]  MWF or [ ]  TTHS, [ ]  Burning with urination, [ ]  Frequent urination, [ ]  Difficulty urinating;   Skin: No rashes  Psychological: No history of anxiety,  No history of depression   Physical Examination  Vitals:   04/04/19 1632 04/04/19 1826 04/04/19 1945 04/05/19 0500  BP: (!) 147/91 (!) 158/90 (!) 171/67 137/86  Pulse: 83 86 78 73  Resp: 16 18 16 16   Temp: 98.6 F (37 C) 98.4 F (36.9 C) 98.7 F (37.1 C) 98.4 F (36.9 C)  TempSrc:  Oral Oral Oral  SpO2: 96% 100% 100% 100%  Weight:      Height:        Body mass index is 20.75 kg/m.  General:  Alert and oriented, no acute distress HEENT: Normal Neck: No bruit or JVD Pulmonary: Clear to auscultation bilaterally Cardiac: Regular Rate and Rhythm Abdomen: Soft,  non-tender, non-distended, no mass Skin: No rash, dry gangrene right first toe with erythema involving foot to the ankle level, early gangrene left 2nd toe Extremity Pulses:  2+ radial, brachial, femoral, absent popliteal dorsalis pedis, posterior tibial pulses bilaterally Musculoskeletal: No deformity or edema  Neurologic: Upper and lower extremity motor 5/5 and symmetric  DATA:  CBC  Component Value Date/Time   WBC 7.6 04/05/2019 0300   RBC 4.48 04/05/2019 0300   HGB 12.4 (L) 04/05/2019 0300   HCT 37.1 (L) 04/05/2019 0300   HCT 44 (A) 11/14/2015 0758   PLT 271 04/05/2019 0300   MCV 82.8 04/05/2019 0300   MCV 87.8 11/14/2015 0758   MCH 27.7 04/05/2019 0300   MCHC 33.4 04/05/2019 0300   RDW 11.5 04/05/2019 0300   RDW 13.7 11/14/2015 0758   LYMPHSABS 1.6 04/04/2019 0954   MONOABS 0.5 04/04/2019 0954   EOSABS 0.1 04/04/2019 0954   EOSABS 180 11/14/2015 0758   BASOSABS 0.0 04/04/2019 0954    BMET    Component Value Date/Time   NA 134 (L) 04/05/2019 0300   NA 137 11/14/2015 0758   K 3.6 04/05/2019 0300   K 4.1 11/14/2015 0758   CL 98 04/05/2019 0300   CL 102 11/14/2015 0758   CO2 22 04/05/2019 0300   CO2 26 11/14/2015 0758   GLUCOSE 160 (H) 04/05/2019 0300   BUN 10 04/05/2019 0300   CREATININE 0.85 04/05/2019 0300   CREATININE 1.03 11/14/2015 0758   CALCIUM 9.0 04/05/2019 0300   CALCIUM 9.1 11/14/2015 0758   GFRNONAA >60 04/05/2019 0300   GFRAA >60 04/05/2019 0300   COVID negative   ASSESSMENT:  Gangrene right first toe with cellulitis and early gangrene left 2nd toe at risk for limb loss   PLAN:  Aortogram with bilateral runoff possible intervention today.  Risks benefits possible complications d/w pt including but non limited to bleeding infection contrast reaction and possible limb loss despite any intervention  Will need to broaden antibiotic coverage as most likely this is polymicrobial with his diabetes, vanc zosyn   Ruta Hinds, MD [facility] [place]: [phone] Pager: [phone]

## 2019-04-05 NOTE — Progress Notes (Signed)
Bilateral upper extremity vein mapping completed. Refer to "CV Proc" under chart review to view preliminary results.  04/05/2019 2:51 PM Eula Fried., MHA, RVT, RDCS, RDMS

## 2019-04-05 NOTE — Progress Notes (Signed)
PROGRESS NOTE  Juan Turner QBH:[id number] DOB: May 06, 1955 DOA: 04/04/2019 PCP: Juan Turner, No Pcp Per  HPI/Recap of past 24 hours: HPI from Dr Rachelle Hora  is a 64 y.o. male, with past medical history of diabetes mellitus, hypertension, peripheral vascular disease, tobacco use, presents to ED c/o worsening right great toe discoloration and pain. Juan Turner followed by podiatry Dr. Germaine Pomfret, with poor outpatient follow-up. In the ED Juan Turner was noted to be afebrile, with mildly elevated blood pressure, there was no leukocytosis, Juan Turner was started on antibiotics. [facility] was consulted to admit for dry gangrene.    Today, pt denies any new complaints. Denies any chest pain, fever/chills, SOB. 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 Darrick Penna on 04/05/2019, recommend bypass surgery and right great toe amputation on 04/07/2019 by Dr. Randie Heinz Continue broad-spectrum Vanco and Zosyn until time of bypass procedure Start aspirin, Crestor daily Monitor closely  Diabetes mellitus type 2 Uncontrolled A1c 9.6 on 04/04/2019 SSI, Accu-Cheks, hypoglycemic protocol  Hypertension Continue amlodipine, benazepril  Tobacco abuse Advised to quit Continue nicotine patch        Malnutrition Type:      Malnutrition Characteristics:      Nutrition Interventions:       Estimated body mass index is 20.75 kg/m as  calculated from the following:   Height as of this encounter: 6' (1.829 m).   Weight as of this encounter: 69.4 kg.     Code Status: Full  Family Communication: Discussed with wife at bedside  Disposition Plan: To be determined, post bypass surgery/amputation   Consultants:  Vascular surgery  Procedures:  Abdominal aortogram on 04/05/2019  Antimicrobials:  Vancomycin  Zosyn  DVT prophylaxis: Heparin [place]   Objective: Vitals:   04/05/19 1005 04/05/19 1010 04/05/19 1015 04/05/19 1030  BP: (!) 115/58 132/63 (!) 121/59 138/77  Pulse: 68 66 68 72  Resp: (!) 21 (!) 22 (!) 21 18  Temp:      TempSrc:      SpO2: 100% 100% 100% 100%  Weight:      Height:        Intake/Output Summary (Last 24 hours) at 04/05/2019 1130 Last data filed at 04/05/2019 0500 Gross per 24 hour  Intake 400 ml  Output --  Net 400 ml   Filed Weights   04/04/19 0915  Weight: 69.4 kg    Exam:  General: NAD   Cardiovascular: S1, S2 present  Respiratory: CTAB  Abdomen: Soft, nontender, nondistended, bowel sounds present  Musculoskeletal: No bilateral pedal edema noted, R great toe dry gangrene, tip of L 2nd toe dry gangrene  Skin: Normal  Psychiatry: Normal mood    Data Reviewed: CBC: Recent Labs  Lab 04/04/19 0954 04/04/19 1901 04/05/19 0300  WBC 7.5 8.9 7.6  NEUTROABS 5.2  --   --   HGB 13.6 13.5 12.4*  HCT 42.3 40.6 37.1*  MCV 85.8 83.5 82.8  PLT 247 288 271   Basic Metabolic Panel: Recent Labs  Lab 04/04/19  7829 04/04/19 1901 04/05/19 0300  NA 132*  --  134*  K 4.3  --  3.6  CL 96*  --  98  CO2 25  --  22  GLUCOSE 321*  --  160*  BUN 13  --  10  CREATININE 1.04 0.90 0.85  CALCIUM 9.4  --  9.0   GFR: Estimated Creatinine Clearance: 85 mL/min (by C-G formula based on SCr of 0.85 mg/dL). Liver Function Tests: No results for input(s): AST, ALT, ALKPHOS, BILITOT, PROT, ALBUMIN in the last 168 hours. No results for input(s): LIPASE, AMYLASE in the last 168  hours. No results for input(s): AMMONIA in the last 168 hours. Coagulation Profile: No results for input(s): INR, PROTIME in the last 168 hours. Cardiac Enzymes: No results for input(s): CKTOTAL, CKMB, CKMBINDEX, TROPONINI in the last 168 hours. BNP (last 3 results) No results for input(s): PROBNP in the last 8760 hours. HbA1C: Recent Labs    04/04/19 0954  HGBA1C 9.6*   CBG: Recent Labs  Lab 04/04/19 1233 04/04/19 1650 04/04/19 2054 04/05/19 0640 04/05/19 0917  GLUCAP 226* 205* 266* 154* 145*   Lipid Profile: No results for input(s): CHOL, HDL, LDLCALC, TRIG, CHOLHDL, LDLDIRECT in the last 72 hours. Thyroid Function Tests: No results for input(s): TSH, T4TOTAL, FREET4, T3FREE, THYROIDAB in the last 72 hours. Anemia Panel: No results for input(s): VITAMINB12, FOLATE, FERRITIN, TIBC, IRON, RETICCTPCT in the last 72 hours. Urine analysis:    Component Value Date/Time   COLORURINE YELLOW 04/16/2011 1908   APPEARANCEUR CLEAR 04/16/2011 1908   LABSPEC 1.015 04/16/2011 1908   PHURINE 6.0 04/16/2011 1908   GLUCOSEU NEGATIVE 04/16/2011 1908   HGBUR NEGATIVE 04/16/2011 1908   BILIRUBINUR NEGATIVE 04/16/2011 1908   KETONESUR NEGATIVE 04/16/2011 1908   PROTEINUR NEGATIVE 04/16/2011 1908   UROBILINOGEN 0.2 04/16/2011 1908   NITRITE NEGATIVE 04/16/2011 1908   LEUKOCYTESUR NEGATIVE 04/16/2011 1908   Sepsis Labs: @LABRCNTIP (procalcitonin:4,lacticidven:4)  ) Recent Results (from the past 240 hour(s))  SARS CORONAVIRUS 2 (TAT 6-24 HRS) Nasopharyngeal Nasopharyngeal Swab     Status: None   Collection Time: 04/04/19  4:10 PM   Specimen: Nasopharyngeal Swab  Result Value Ref Range Status   SARS Coronavirus 2 NEGATIVE NEGATIVE Final    Comment: (NOTE) SARS-CoV-2 target nucleic acids are NOT DETECTED. The SARS-CoV-2 RNA is generally detectable in upper and lower respiratory specimens during the acute phase of infection. Negative results do not preclude SARS-CoV-2 infection, do  not rule out co-infections with other pathogens, and should not be used as the sole basis for treatment or other Juan Turner management decisions. Negative results must be combined with clinical observations, Juan Turner history, and epidemiological information. The expected result is Negative. Fact Sheet for Patients: 04/06/19 Fact Sheet for Healthcare Providers: HairSlick.no This test is not yet approved or cleared by the quierodirigir.com FDA and  has been authorized for detection and/or diagnosis of SARS-CoV-2 by FDA under an Emergency Use Authorization (EUA). This EUA will remain  in effect (meaning this test can be used) for the duration of the COVID-19 declaration under Section 56 4(b)(1) of the Act, 21 U.S.C. section 360bbb-3(b)(1), unless the authorization is terminated or revoked sooner. Performed at [facility], [address], [place], [place]       Studies: PERIPHERAL VASCULAR CATHETERIZATION  Result Date: 04/05/2019 Procedure: Abdominal aortogram with bilateral lower extremity runoff, second order catheterization right external iliac artery, retrograde left common femoral puncture, ultrasound left groin Preoperative diagnosis: Gangrene right first  toe left second toe Postoperative diagnosis: Same Anesthesia: Local with IV sedation Sedation time: 51 minutes Operative findings: 1.  Bilateral popliteal artery occlusion 2.  Bilateral one-vessel posterior tibial artery runoff Operative details: After team informed consent, the Juan Turner and the PV lab.  The Juan Turner was placed in supine position angio table.  Both groins were prepped and draped in usual sterile fashion.  Local anesthesia was infiltrated over the left common femoral artery.  Ultrasound was used to identify the left common femoral artery and femoral bifurcation.  Using ultrasound guidance an introducer needle was used to cannulate the left common femoral  artery and an 035 versa core wire threaded up the abdominal aorta under fluoroscopic guidance.  Next a 5 French sheath was placed over the guidewire in the left common femoral artery.  This was thoroughly flushed with heparinized saline.  5 French pigtail catheter was then advanced over the guidewire in the abdominal aorta and abdominal aortogram was obtained in AP projection.  The left and right renal arteries are patent.  The infrarenal abdominal aorta is widely patent with no flow-limiting stenosis.  There is mild calcification.  The left and right common external and internal iliac arteries are all patent.  Next the pigtail catheter is pulled down just above the aortic bifurcation and a pelvic angiogram obtained to further clarify the above findings.  Left and right common femoral artery is also patent. Next bilateral lower extremity runoff views were obtained through the pigtail catheter. In the left lower extremity, the left common femoral profunda femoris and superficial femoral arteries are all patent.  The popliteal artery occludes above the knee.  It then reconstitutes is a very diseased below-knee popliteal artery and a diminutive peroneal artery.  The origins of the anterior tibial and posterior tibial artery are occluded.  The posterior tibial artery then reconstitutes and is single-vessel runoff to the left foot.  Additional stationary views were obtained from the below-knee segment of the foot including the lateral foot view to further clarify the anatomy on the left side. In the right lower extremity there are similar findings with one-vessel posterior tibial artery runoff a very diseased popliteal artery there is a diminutive peroneal artery and the anterior tibial artery occludes in the proximal leg. At this point the pigtail catheter was removed.  A 5 French crossover catheter was exchanged over the guidewire and the right common iliac followed by the right external iliac artery selected and the  crossover catheter swapped out for 5 French straight catheter.  Additional lower extremity views on the right side were made to further clarify the below-knee popliteal and tibial vessels as well as lateral foot view. Juan Turner tired procedure well and there were no complications.  Juan Turner was taken to the holding area in stable condition after the straight catheter was removed.  The 5 French sheath was left in place to be pulled the holding area. Operative management: Juan Turner will be scheduled in the near future for a right femoral to posterior tibial artery bypass with the proximal target site determined by adequacy of venous conduit.  The Juan Turner will also need amputation of his first toe.  He will be maintained on vancomycin and Zosyn antibiotic coverage until the time of his bypass procedure. He will be started on aspirin 81 mg once daily and Crestor 10 mg once daily for long-term management of his peripheral arterial disease. Fabienne Brunsharles Fields, MD [facility] [place] Office: [phone]   VAS US LOWER EXTREMITY SAPHENOUS  VEIN MAPPING  Result Date: 04/05/2019 LOWER EXTREMITY VEIN MAPPING Indications:       Pre-op Other Indications: Status post arteriogram revealed bilateral popliteal artery                    occlusion with one-vessel PTA runoff bilaterally. Risk Factors:      Hypertension, Diabetes, current smoker.  Performing Technologist: Oda Cogan RDMS, RVT  Examination Guidelines: A complete evaluation includes B-mode imaging, spectral Doppler, color Doppler, and power Doppler as needed of all accessible portions of each vessel. Bilateral testing is considered an integral part of a complete examination. Limited examinations for reoccurring indications may be performed as noted. +--------------+--------------+-------------------+-------------+--------------+  RT Diameter   RT Findings          GSV         LT Diameter  LT Findings        (cm)                                           (cm)                    +--------------+--------------+-------------------+-------------+--------------+      3.90     branching and   Saphenofemoral       3.00                                   Medial vein       Junction                                                    mapped                                                   +--------------+--------------+-------------------+-------------+--------------+      4.40                     Proximal thigh       2.90                    +--------------+--------------+-------------------+-------------+--------------+      2.50                        Mid thigh         2.70                    +--------------+--------------+-------------------+-------------+--------------+      2.20                      Distal thigh        2.60                    +--------------+--------------+-------------------+-------------+--------------+      2.60                          Knee  2.70                    +--------------+--------------+-------------------+-------------+--------------+      2.50       branching        Prox calf         1.50        chronic                                                                    thrombus    +--------------+--------------+-------------------+-------------+--------------+      1.80                        Mid calf          1.90        chronic                                                                    thrombus    +--------------+--------------+-------------------+-------------+--------------+      1.70                       Distal calf        1.20        chronic                                                                    thrombus    +--------------+--------------+-------------------+-------------+--------------+      2.10                          Ankle           1.30        chronic                                                                     thrombus    +--------------+--------------+-------------------+-------------+--------------+    Preliminary     Scheduled Meds: . [MAR Hold] amLODipine  5 mg Oral Daily   And  . [MAR Hold] benazepril  10 mg Oral Daily  . aspirin EC  81 mg Oral Daily  . [MAR Hold] heparin  5,000 Units Subcutaneous Q8H  . [MAR Hold] insulin aspart  0-5 Units Subcutaneous QHS  . [MAR Hold] insulin aspart  0-9 Units Subcutaneous TID WC  . [MAR Hold] nicotine  21 mg Transdermal Daily    Continuous Infusions: . sodium chloride 100 mL/hr at 04/05/19 0554  . sodium chloride  LOS: 1 day     Briant Cedar, MD [facility]  If 7PM-7AM, please contact night-coverage [email or web address] 04/05/2019, 11:30 AM

## 2019-04-05 NOTE — Progress Notes (Signed)
Pharmacy Antibiotic Note  Juan Turner is a 64 y.o. male admitted on 04/04/2019 with a diabetic foot ulcer  He is s/p aortogram and plans for right femoral to posterior tibial artery bypass and toe amputation. Pharmacy has been consulted for vancomycin and zosyn dosing. -WBC= 7.6, afebrile, SCr= 0.85  Plan: -Zosyn 3.375gm IV q8h -Vancomycin 1000mg  IV q12h (estimated AUC= 483) -Will follow renal function, cultures and clinical progress  Height: 6' (182.9 cm) Weight: 153 lb (69.4 kg) IBW/kg (Calculated) : 77.6  Temp (24hrs), Avg:98.5 F (36.9 C), Min:98.4 F (36.9 C), Max:98.7 F (37.1 C)  Recent Labs  Lab 04/04/19 0954 04/04/19 1901 04/05/19 0300  WBC 7.5 8.9 7.6  CREATININE 1.04 0.90 0.85    Estimated Creatinine Clearance: 85 mL/min (by C-G formula based on SCr of 0.85 mg/dL).    No Known Allergies  Antimicrobials this admission: 2/22 doxy>>2/23 2/22 ancef>> 2/23 2/23 vanc 2/23 zosyn  Dose adjustments this admission:   Microbiology results:  Thank you for allowing pharmacy to be a part of this patient's care.  3/23, PharmD Clinical Pharmacist **Pharmacist phone directory can now be found on [email or web address] ([email or web address]).  Listed under [facility].

## 2019-04-06 ENCOUNTER — Inpatient Hospital Stay (HOSPITAL_COMMUNITY): Payer: Medicare Other

## 2019-04-06 DIAGNOSIS — E11621 Type 2 diabetes mellitus with foot ulcer: Secondary | ICD-10-CM | POA: Diagnosis not present

## 2019-04-06 DIAGNOSIS — Z01810 Encounter for preprocedural cardiovascular examination: Secondary | ICD-10-CM

## 2019-04-06 DIAGNOSIS — I10 Essential (primary) hypertension: Secondary | ICD-10-CM | POA: Diagnosis not present

## 2019-04-06 DIAGNOSIS — L97509 Non-pressure chronic ulcer of other part of unspecified foot with unspecified severity: Secondary | ICD-10-CM | POA: Diagnosis not present

## 2019-04-06 DIAGNOSIS — I96 Gangrene, not elsewhere classified: Secondary | ICD-10-CM | POA: Diagnosis not present

## 2019-04-06 DIAGNOSIS — E1152 Type 2 diabetes mellitus with diabetic peripheral angiopathy with gangrene: Secondary | ICD-10-CM | POA: Diagnosis not present

## 2019-04-06 LAB — CBC WITH DIFFERENTIAL/PLATELET
Abs Immature Granulocytes: 0.01 10*3/uL (ref 0.00–0.07)
Basophils Absolute: 0 10*3/uL (ref 0.0–0.1)
Basophils Relative: 0 %
Eosinophils Absolute: 0.1 10*3/uL (ref 0.0–0.5)
Eosinophils Relative: 2 %
HCT: 35.3 % — ABNORMAL LOW (ref 39.0–52.0)
Hemoglobin: 11.8 g/dL — ABNORMAL LOW (ref 13.0–17.0)
Immature Granulocytes: 0 %
Lymphocytes Relative: 23 %
Lymphs Abs: 1.5 10*3/uL (ref 0.7–4.0)
MCH: 27.4 pg (ref 26.0–34.0)
MCHC: 33.4 g/dL (ref 30.0–36.0)
MCV: 82.1 fL (ref 80.0–100.0)
Monocytes Absolute: 0.6 10*3/uL (ref 0.1–1.0)
Monocytes Relative: 9 %
Neutro Abs: 4.4 10*3/uL (ref 1.7–7.7)
Neutrophils Relative %: 66 %
Platelets: 237 10*3/uL (ref 150–400)
RBC: 4.3 MIL/uL (ref 4.22–5.81)
RDW: 11.5 % (ref 11.5–15.5)
WBC: 6.6 10*3/uL (ref 4.0–10.5)
nRBC: 0 % (ref 0.0–0.2)

## 2019-04-06 LAB — BASIC METABOLIC PANEL
Anion gap: 12 (ref 5–15)
BUN: 8 mg/dL (ref 8–23)
CO2: 23 mmol/L (ref 22–32)
Calcium: 8.6 mg/dL — ABNORMAL LOW (ref 8.9–10.3)
Chloride: 98 mmol/L (ref 98–111)
Creatinine, Ser: 0.94 mg/dL (ref 0.61–1.24)
GFR calc Af Amer: >60 mL/min (ref >60)
GFR calc non Af Amer: >60 mL/min (ref >60)
Glucose, Bld: 155 mg/dL — ABNORMAL HIGH (ref 70–99)
Potassium: 3.8 mmol/L (ref 3.5–5.1)
Sodium: 133 mmol/L — ABNORMAL LOW (ref 135–145)

## 2019-04-06 LAB — SURGICAL PCR SCREEN
MRSA, PCR: NEGATIVE
Staphylococcus aureus: NEGATIVE

## 2019-04-06 LAB — GLUCOSE, CAPILLARY
Glucose-Capillary: 136 mg/dL — ABNORMAL HIGH (ref 70–99)
Glucose-Capillary: 198 mg/dL — ABNORMAL HIGH (ref 70–99)
Glucose-Capillary: 203 mg/dL — ABNORMAL HIGH (ref 70–99)
Glucose-Capillary: 109 mg/dL — ABNORMAL HIGH (ref 70–99)

## 2019-04-06 LAB — VANCOMYCIN, TROUGH: Vancomycin Tr: 14 ug/mL — ABNORMAL LOW (ref 15–20)

## 2019-04-06 LAB — VANCOMYCIN, PEAK: Vancomycin Pk: 30 ug/mL (ref 30–40)

## 2019-04-06 MED ORDER — HYDROCERIN EX CREA
TOPICAL_CREAM | Freq: Two times a day (BID) | CUTANEOUS | Status: DC
  Administered 2019-04-06: 1 via TOPICAL
  Filled 2019-04-06: qty 113

## 2019-04-06 NOTE — Progress Notes (Signed)
PROGRESS NOTE    Juan Turner  MEQ:[id number]  DOB: 1955-09-19  PCP: Patient, No Pcp Per Admit date:04/04/2019  64 y.o.male,with past medical history of diabetes mellitus, hypertension, peripheral vascular disease, tobacco use, presents to ED c/o worsening right great toe discoloration and pain. Patient followed by podiatry Dr. Germaine Pomfret, with poor outpatient follow-up.He originally presented to podiatry clinic for a wound on the left second toe which has been improving -He was referred to vascular studies but could not complete the study or f/u Vascular surgery due to insurance issues.Patient recently had what appeared to be an injury to his right hallux toenail and when he presented to the office there was drainage of pus underneath the toenail. Dr Denton Brick debrided the loose toenail at that time and the wound was already present.  He was started on antibiotics. 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    ED Course: Afebrile with mildly elevated blood pressure, there was no leukocytosis, patient was started on antibiotics. Hospital course: Patient admitted to [facility] service with broad-spectrum Vanco and Zosyn,  underwent angio 2/23 morning by Dr Darrick Penna and found to have bilateral popliteal artery occlusion, bilateral one-vessel posterior tibial artery runoff. Dr. Darrick Penna recommended bypass surgery and right great toe amputation on 04/07/2019 by Dr. Randie Heinz.Started on aspirin, Crestor daily  Subjective:  Resting comfortably.  Reports itching along the toes and feet where there is dry skin.  He verbalized understanding of the plan for surgery tomorrow.  Objective: Vitals:   04/06/19 0014 04/06/19 0544 04/06/19 0547 04/06/19 0755  BP: 96/63  99/69 (!) 143/78  Pulse: 79  71   Resp: 18  15   Temp: 98.4 F (36.9 C) 98.2 F (36.8 C)  98 F (36.7 C)  TempSrc: Oral Oral  Oral  SpO2: 100%  99% 96%  Weight:      Height:        Intake/Output Summary (Last 24  hours) at 04/06/2019 0812 Last data filed at 04/06/2019 0700 Gross per 24 hour  Intake 1850.76 ml  Output 1225 ml  Net 625.76 ml   Filed Weights   04/04/19 0915  Weight: 69.4 kg    Physical Examination:  General exam: Appears calm and comfortable  Respiratory system: Clear to auscultation. Respiratory effort normal. Cardiovascular system: S1 & S2 heard, RRR. No JVD, murmurs, rubs, gallops or clicks. No pedal edema. Gastrointestinal system: Abdomen is nondistended, soft and nontender. Normal bowel sounds heard. Central nervous system: Alert and oriented. No new focal neurological deficits. Extremities: No contractures, edema or joint deformities.  Skin: No rashes, lesions or ulcers Psychiatry: Judgement and insight appear normal. Mood & affect appropriate.   Data Reviewed: I have personally reviewed following labs and imaging studies  CBC: Recent Labs  Lab 04/04/19 0954 04/04/19 1901 04/05/19 0300 04/06/19 0320  WBC 7.5 8.9 7.6 6.6  NEUTROABS 5.2  --   --  4.4  HGB 13.6 13.5 12.4* 11.8*  HCT 42.3 40.6 37.1* 35.3*  MCV 85.8 83.5 82.8 82.1  PLT 247 288 271 237   Basic Metabolic Panel: Recent Labs  Lab 04/04/19 0954 04/04/19 1901 04/05/19 0300 04/06/19 0320  NA 132*  --  134* 133*  K 4.3  --  3.6 3.8  CL 96*  --  98 98  CO2 25  --  22 23  GLUCOSE 321*  --  160* 155*  BUN 13  --  10 8  CREATININE 1.04 0.90 0.85 0.94  CALCIUM 9.4  --  9.0 8.6*   GFR: Estimated Creatinine Clearance: 76.9 mL/min (by C-G formula based on SCr of 0.94 mg/dL). Liver Function Tests: No results for input(s): AST, ALT, ALKPHOS, BILITOT, PROT, ALBUMIN in the last 168 hours. No results for input(s): LIPASE, AMYLASE in the last 168 hours. No results for input(s): AMMONIA in the last 168 hours. Coagulation Profile: No results for input(s): INR, PROTIME in the last 168 hours. Cardiac Enzymes: No results for input(s): CKTOTAL, CKMB, CKMBINDEX, TROPONINI in the last 168 hours. BNP (last 3  results) No results for input(s): PROBNP in the last 8760 hours. HbA1C: Recent Labs    04/04/19 0954  HGBA1C 9.6*   CBG: Recent Labs  Lab 04/05/19 0917 04/05/19 1355 04/05/19 1646 04/05/19 2147 04/06/19 0543  GLUCAP 145* 258* 222* 124* 136*   Lipid Profile: No results for input(s): CHOL, HDL, LDLCALC, TRIG, CHOLHDL, LDLDIRECT in the last 72 hours. Thyroid Function Tests: No results for input(s): TSH, T4TOTAL, FREET4, T3FREE, THYROIDAB in the last 72 hours. Anemia Panel: No results for input(s): VITAMINB12, FOLATE, FERRITIN, TIBC, IRON, RETICCTPCT in the last 72 hours. Sepsis Labs: No results for input(s): PROCALCITON, LATICACIDVEN in the last 168 hours.  Recent Results (from the past 240 hour(s))  SARS CORONAVIRUS 2 (TAT 6-24 HRS) Nasopharyngeal Nasopharyngeal Swab     Status: None   Collection Time: 04/04/19  4:10 PM   Specimen: Nasopharyngeal Swab  Result Value Ref Range Status   SARS Coronavirus 2 NEGATIVE NEGATIVE Final    Comment: (NOTE) SARS-CoV-2 target nucleic acids are NOT DETECTED. The SARS-CoV-2 RNA is generally detectable in upper and lower respiratory specimens during the acute phase of infection. Negative results do not preclude SARS-CoV-2 infection, do not rule out co-infections with other pathogens, and should not be used as the sole basis for treatment or other patient management decisions. Negative results must be combined with clinical observations, patient history, and epidemiological information. The expected result is Negative. Fact Sheet for Patients: HairSlick.nohttps://www.fda.gov/media/138098/download Fact Sheet for Healthcare Providers: quierodirigir.comhttps://www.fda.gov/media/138095/download This test is not yet approved or cleared by the [place] FDA and  has been authorized for detection and/or diagnosis of SARS-CoV-2 by FDA under an Emergency Use Authorization (EUA). This EUA will remain  in effect (meaning this test can be used) for the duration of  the COVID-19 declaration under Section 56 4(b)(1) of the Act, 21 U.S.C. section 360bbb-3(b)(1), unless the authorization is terminated or revoked sooner. Performed at [facility], [address] [place], [place] [address]       Radiology Studies: PERIPHERAL VASCULAR CATHETERIZATION  Result Date: 04/05/2019 Procedure: Abdominal aortogram with bilateral lower extremity runoff, second order catheterization right external iliac artery, retrograde left common femoral puncture, ultrasound left groin Preoperative diagnosis: Gangrene right first toe left second toe Postoperative diagnosis: Same Anesthesia: Local with IV sedation Sedation time: 51 minutes Operative findings: 1.  Bilateral popliteal artery occlusion 2.  Bilateral one-vessel posterior tibial artery runoff Operative details: After team informed consent, the patient and the PV lab.  The patient was placed in supine position angio table.  Both groins were prepped and draped in usual sterile fashion.  Local anesthesia was infiltrated over the left common femoral artery.  Ultrasound was used to identify the left common femoral artery and femoral bifurcation.  Using ultrasound guidance an introducer needle was used to cannulate the left common femoral artery and an 035 versa core wire threaded up the abdominal aorta under fluoroscopic guidance.  Next a 5  French sheath was placed over the guidewire in the left common femoral artery.  This was thoroughly flushed with heparinized saline.  5 French pigtail catheter was then advanced over the guidewire in the abdominal aorta and abdominal aortogram was obtained in AP projection.  The left and right renal arteries are patent.  The infrarenal abdominal aorta is widely patent with no flow-limiting stenosis.  There is mild calcification.  The left and right common external and internal iliac arteries are all patent.  Next the pigtail catheter is pulled down just above the aortic bifurcation and a pelvic  angiogram obtained to further clarify the above findings.  Left and right common femoral artery is also patent. Next bilateral lower extremity runoff views were obtained through the pigtail catheter. In the left lower extremity, the left common femoral profunda femoris and superficial femoral arteries are all patent.  The popliteal artery occludes above the knee.  It then reconstitutes is a very diseased below-knee popliteal artery and a diminutive peroneal artery.  The origins of the anterior tibial and posterior tibial artery are occluded.  The posterior tibial artery then reconstitutes and is single-vessel runoff to the left foot.  Additional stationary views were obtained from the below-knee segment of the foot including the lateral foot view to further clarify the anatomy on the left side. In the right lower extremity there are similar findings with one-vessel posterior tibial artery runoff a very diseased popliteal artery there is a diminutive peroneal artery and the anterior tibial artery occludes in the proximal leg. At this point the pigtail catheter was removed.  A 5 French crossover catheter was exchanged over the guidewire and the right common iliac followed by the right external iliac artery selected and the crossover catheter swapped out for 5 French straight catheter.  Additional lower extremity views on the right side were made to further clarify the below-knee popliteal and tibial vessels as well as lateral foot view. Patient tired procedure well and there were no complications.  Patient was taken to the holding area in stable condition after the straight catheter was removed.  The 5 French sheath was left in place to be pulled the holding area. Operative management: Patient will be scheduled in the near future for a right femoral to posterior tibial artery bypass with the proximal target site determined by adequacy of venous conduit.  The patient will also need amputation of his first toe.  He will  be maintained on vancomycin and Zosyn antibiotic coverage until the time of his bypass procedure. He will be started on aspirin 81 mg once daily and Crestor 10 mg once daily for long-term management of his peripheral arterial disease. Fabienne Bruns, MD [facility] Office: [phone]   DG Foot Complete Right  Result Date: 04/04/2019 CLINICAL DATA:  Right foot pain with great toe ulcer. EXAM: RIGHT FOOT COMPLETE - 3+ VIEW COMPARISON:  None. FINDINGS: Soft tissue defect noted great toe, compatible with the reported history of ulcer. No underlying bony erosion/destruction in the phalanges of the great toe to suggest osteomyelitis. No evidence for fracture or dislocation. Mild degenerative changes noted MTP joint great toe. IMPRESSION: Soft tissue ulcer noted in the great toe without underlying bony destruction to suggest osteomyelitis by x-ray. Electronically Signed   By: Kennith Center M.D.   On: 04/04/2019 09:54   VAS [place] LOWER EXTREMITY SAPHENOUS VEIN MAPPING  Result Date: 04/05/2019 LOWER EXTREMITY VEIN MAPPING Indications:       Pre-op Other Indications:  Status post arteriogram revealed bilateral popliteal artery                    occlusion with one-vessel PTA runoff bilaterally. Risk Factors:      Hypertension, Diabetes, current smoker.  Performing Technologist: Marilynne Halsted RDMS, RVT  Examination Guidelines: A complete evaluation includes B-mode imaging, spectral Doppler, color Doppler, and power Doppler as needed of all accessible portions of each vessel. Bilateral testing is considered an integral part of a complete examination. Limited examinations for reoccurring indications may be performed as noted. +--------------+--------------+-------------------+-------------+--------------+  RT Diameter   RT Findings          GSV         LT Diameter  LT Findings        (cm)                                          (cm)                     +--------------+--------------+-------------------+-------------+--------------+      3.90     branching and   Saphenofemoral       3.00                                   Medial vein       Junction                                                    mapped                                                   +--------------+--------------+-------------------+-------------+--------------+      4.40                     Proximal thigh       2.90                    +--------------+--------------+-------------------+-------------+--------------+      2.50                        Mid thigh         2.70                    +--------------+--------------+-------------------+-------------+--------------+      2.20                      Distal thigh        2.60                    +--------------+--------------+-------------------+-------------+--------------+      2.60                          Knee            2.70                    +--------------+--------------+-------------------+-------------+--------------+  2.50       branching        Prox calf         1.50        chronic                                                                    thrombus    +--------------+--------------+-------------------+-------------+--------------+      1.80                        Mid calf          1.90        chronic                                                                    thrombus    +--------------+--------------+-------------------+-------------+--------------+      1.70                       Distal calf        1.20        chronic                                                                    thrombus    +--------------+--------------+-------------------+-------------+--------------+      2.10                          Ankle           1.30        chronic                                                                    thrombus     +--------------+--------------+-------------------+-------------+--------------+ Diagnosing physician: Fabienne Bruns MD Electronically signed by Fabienne Bruns MD on 04/05/2019 at 11:44:29 AM.    Final    VAS [place] UPPER EXT VEIN MAPPING (PRE-OP AVF)  Result Date: 04/05/2019 UPPER EXTREMITY VEIN MAPPING  Indications: History of PAD; patient is pre-operative for bypass. Comparison Study: No prior study Performing Technologist: Gertie Fey MHA, RDMS, RVT, RDCS  Examination Guidelines: A complete evaluation includes B-mode imaging, spectral Doppler, color Doppler, and power Doppler as needed of all accessible portions of each vessel. Bilateral testing is considered an integral part of a complete examination. Limited examinations for reoccurring indications may be performed as noted. +-----------------+-------------+----------+--------+ Right Cephalic   Diameter (cm)Depth (cm)Findings +-----------------+-------------+----------+--------+ Shoulder             0.26                        +-----------------+-------------+----------+--------+  Prox upper arm       0.22                        +-----------------+-------------+----------+--------+ Mid upper arm        0.16                        +-----------------+-------------+----------+--------+ Dist upper arm       0.19                        +-----------------+-------------+----------+--------+ Antecubital fossa    0.22                        +-----------------+-------------+----------+--------+ Prox forearm         0.28               thrombus +-----------------+-------------+----------+--------+ Mid forearm          0.17                        +-----------------+-------------+----------+--------+ Dist forearm         0.17                        +-----------------+-------------+----------+--------+ +-----------------+-------------+----------+--------------+ Right Basilic    Diameter (cm)Depth (cm)   Findings     +-----------------+-------------+----------+--------------+ Prox upper arm                          not visualized +-----------------+-------------+----------+--------------+ Mid upper arm                           not visualized +-----------------+-------------+----------+--------------+ Dist upper arm                          not visualized +-----------------+-------------+----------+--------------+ Antecubital fossa                       not visualized +-----------------+-------------+----------+--------------+ Prox forearm                            not visualized +-----------------+-------------+----------+--------------+ Mid forearm                             not visualized +-----------------+-------------+----------+--------------+ Distal forearm                          not visualized +-----------------+-------------+----------+--------------+ Wrist                                   not visualized +-----------------+-------------+----------+--------------+ +-----------------+-------------+----------+--------------+ Left Cephalic    Diameter (cm)Depth (cm)   Findings    +-----------------+-------------+----------+--------------+ Shoulder             0.24                              +-----------------+-------------+----------+--------------+ Prox upper arm       0.19                              +-----------------+-------------+----------+--------------+  Mid upper arm        0.14                 branching    +-----------------+-------------+----------+--------------+ Dist upper arm       0.13                              +-----------------+-------------+----------+--------------+ Antecubital fossa    0.25                              +-----------------+-------------+----------+--------------+ Prox forearm         0.15                 branching    +-----------------+-------------+----------+--------------+ Mid forearm                              not visualized +-----------------+-------------+----------+--------------+ Dist forearm                            not visualized +-----------------+-------------+----------+--------------+ Wrist                                   not visualized +-----------------+-------------+----------+--------------+ +-----------------+-------------+----------+--------------+ Left Basilic     Diameter (cm)Depth (cm)   Findings    +-----------------+-------------+----------+--------------+ Prox upper arm                          not visualized +-----------------+-------------+----------+--------------+ Mid upper arm        0.21                              +-----------------+-------------+----------+--------------+ Dist upper arm       0.20                              +-----------------+-------------+----------+--------------+ Antecubital fossa    0.30                              +-----------------+-------------+----------+--------------+ Prox forearm         0.20                              +-----------------+-------------+----------+--------------+ Mid forearm          0.18                              +-----------------+-------------+----------+--------------+ Distal forearm       0.22                              +-----------------+-------------+----------+--------------+ Wrist                                   not visualized +-----------------+-------------+----------+--------------+ *See table(s) above for measurements and observations.  Diagnosing physician: Lemar LivingsBrandon Cain MD Electronically signed by Lemar LivingsBrandon Cain MD on 04/05/2019 at 6:10:29 PM.    Final  Scheduled Meds: . amLODipine  5 mg Oral Daily   And  . benazepril  10 mg Oral Daily  . aspirin EC  81 mg Oral Daily  . heparin  5,000 Units Subcutaneous Q8H  . insulin aspart  0-15 Units Subcutaneous TID WC  . insulin aspart  0-5 Units Subcutaneous QHS  . nicotine  21 mg Transdermal  Daily  . rosuvastatin  10 mg Oral q1800  . sodium chloride flush  3 mL Intravenous Q12H   Continuous Infusions: . sodium chloride 100 mL/hr at 04/06/19 0032  . sodium chloride    . piperacillin-tazobactam (ZOSYN)  IV 3.375 g (04/06/19 0754)  . vancomycin 1,000 mg (04/05/19 2112)    Assessment & Plan:   Assessment/Plan:  Right great toe, left second toe gangrene: PVD on abdominal aortogram with bilateral popliteal artery occlusion,bilateral one-vessel posterior tibial artery runoff . Currently afebrile, no leukocytosis. Vascular surgery on board, plan for bypass surgery and right great toe amputation on 04/07/2019 by Dr. Randie Heinz.Continue broad-spectrum Vanco and Zosyn until time of bypass procedure. Started on aspirin, Crestor daily  Diabetes mellitus type 2, Uncontrolled: HgbA1c 9.6 on 04/04/2019. SSI for now, Accu-Cheks, can consider oral hypoglycemics post surgery.   Hypertension:Continue amlodipine, benazepril  Mild hyponatremia: could be pseudohyponatremia in the setting of hyperglycemia or pain related SIADH. Monitor on IV fluids  Tobacco abuse: Advised to quit as contributing to PVD, continue nicotine patch   DVT prophylaxis: heparin Code Status: Full code Family / Patient Communication: d/w patient and all questions answered to satisfaction. Disposition Plan:   Patient is from home prior to hospitalization. Received/Receiving inpatient care for PVD /gangrene needing bypass surgery/toe amputation Discharge when abx transitioned to po after surgery and diabetes regimen determined.      LOS: 2 days    Time spent: 35 minutes    Alessandra Bevels, MD [facility] Pager in [place]  If 7PM-7AM, please contact night-coverage [email or web address] 04/06/2019, 8:12 AM

## 2019-04-06 NOTE — Progress Notes (Addendum)
Pharmacy Antibiotic Note  Juan Turner is a 64 y.o. male admitted on 04/04/2019 with a diabetic foot ulcer (R great toe, L second toe gangrene)  He is S/P aortogram, with plans for right femoral to posterior tibial artery bypass and R great toe amputation on 2/25. Pharmacy has been consulted for vancomycin and zosyn dosing.  WBC 6.6, afebrile, Scr 0.94, CrCl 76.9 ml/min, renal function relatively stable  As pt will be in surgery tomorrow, pre-steady-state vancomycin levels were drawn after second dose of 1 gm IV Q 12 hr regimen today (dose administered at 11:18 AM today): Vancomycin peak level drawn at 13:03 PM was 30 mg/L Vancomycin trough level drawn at 19:42 PM was 14 mg/L Vancomycin AUC calculated from these levels was 451.2, which is within the goal vancomycin AUC range of 400-550  Plan: Continue Zosyn 3.375 gm IV Q 8 hrs (extended infusion) Continue vancomycin 1 gm IV Q 12 hrs  Monitor WBC, temp, clinical improvement, renal function, vancomycin levels as indicated  Height: 6' (182.9 cm) Weight: 153 lb (69.4 kg) IBW/kg (Calculated) : 77.6  Temp (24hrs), Avg:98.4 F (36.9 C), Min:98 F (36.7 C), Max:99.1 F (37.3 C)  Recent Labs  Lab 04/04/19 0954 04/04/19 1901 04/05/19 0300 04/06/19 0320 04/06/19 1303 04/06/19 1942  WBC 7.5 8.9 7.6 6.6  --   --   CREATININE 1.04 0.90 0.85 0.94  --   --   VANCOTROUGH  --   --   --   --   --  14*  VANCOPEAK  --   --   --   --  30  --     Estimated Creatinine Clearance: 76.9 mL/min (by C-G formula based on SCr of 0.94 mg/dL).    No Known Allergies  Antimicrobials this admission: 2/22 doxy>>2/23 2/22 ancef>> 2/23 2/23 vanc 2/23 zosyn  Microbiology results: 2/22 COVID, HIV: both negative  Vicki Mallet, PharmD, BCPS, [facility] Clinical Pharmacist 04/06/19, 20:55 PM

## 2019-04-06 NOTE — Progress Notes (Signed)
Inpatient Diabetes Program Recommendations  AACE/ADA: New Consensus Statement on Inpatient Glycemic Control (2015)  Target Ranges:  Prepandial:   less than 140 mg/dL      Peak postprandial:   less than 180 mg/dL (1-2 hours)      Critically ill patients:  140 - 180 mg/dL   Lab Results  Component Value Date   GLUCAP 198 (H) 04/06/2019   HGBA1C 9.6 (H) 04/04/2019    Review of Glycemic Control  Results for ARYA, BOXLEY (MRN [id number]) as of 04/06/2019 14:30  Ref. Range 04/05/2019 16:46 04/05/2019 21:47 04/06/2019 05:43 04/06/2019 12:00  Glucose-Capillary Latest Ref Range: 70 - 99 mg/dL 956 (H) 387 (H) 564 (H) 198 (H)    Diabetes history: DM2 Outpatient Diabetes medications:  Glipizide 5 mg QD (pt not taking) Current orders for Inpatient glycemic control: Novolog 0-15 TID + 0-5 QHS   Note: Spoke with patient at bedside.  He is scheduled for right big toe amputation.  Reviewed patient's current A1c of 9.6% (average blood sugar 229 mg/dl). Explained what a A1c is and what it measures. Also reviewed goal A1c with patient, importance of good glucose control @ home, and blood sugar goals.  PCP is Dr. Juanetta Gosling.  He has retired and wife states he has a new PCP appt but can't remember the doctors name.  She will find out the name and let me know so we can make post hospital visit follow up.    Spoke with pt about new diagnosis. Discussed A1C results with them and explained what an A1C is, basic pathophysiology of DM Type 2, basic home care, basic diabetes diet nutrition principles, importance of checking CBGs and maintaining good CBG control to prevent long-term and short-term complications. Reviewed signs and symptoms of hyperglycemia and hypoglycemia and how to treat hypoglycemia at home. Also reviewed blood sugar goals at home.  RNs to provide ongoing basic DM education at bedside with this patient. Have ordered educational booklet. Have also placed RD consult for DM diet education for this patient.     Patient needs a Glucometer prior to discharge order# [id number]  Will continue to follow  Thank you, Dulce Sellar, RN, BSN Diabetes Coordinator Inpatient Diabetes Program [phone] (team pager from 8a-5p)

## 2019-04-06 NOTE — Progress Notes (Addendum)
Care plan reviewed. Pt's progressing. Remained afebrile, vital signs stable. 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 Ambulated independently in his room. Doppler detected signal from PT an DP pulses bilaterally. No incidents of immediate distress noted tonight. We will continue to monitor.  Filiberto Pinks, RN

## 2019-04-06 NOTE — Consult Note (Signed)
Reason for Consult: Gangrene Referring Physician: March Rummage, PA-C  Juan Turner is an 64 y.o. male.  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  Patient recently had what appeared to be an injury to his right hallux toenail and when he presented to the office there was drainage of pus underneath the toenail.  I debrided the loose toenail at that time and the wound was already present.  He was started on antibiotics.  He originally presented for a wound on the left second toe which has been improving he reports.  I attempted to get him in for vascular studies but there was issues with insurance apparently.  Also he was referred to vascular surgery however he canceled this appointment.  Have not seen him in about 1 month.  After talking to the patient and his wife who was at bedside there was bleeding to the toe and then over this past weekend is when his wife started to notice that the toe was becoming dark in color and that is when he presented to the emergency room.  Underwent angio this morning is scheduled for bypass on Thursday.  Past Medical History:  Diagnosis Date  . Anxiety   . Diabetes mellitus   . Hypertension     Past Surgical History:  Procedure Laterality Date  . ABDOMINAL AORTOGRAM W/LOWER EXTREMITY N/A 04/05/2019   Procedure: ABDOMINAL AORTOGRAM W/LOWER EXTREMITY;  Surgeon: Sherren Kerns, MD;  Location: [facility] INVASIVE CV LAB;  Service: Cardiovascular;  Laterality: N/A;  . HERNIA REPAIR      No family history on file.  Social History:  reports that he has been smoking. He has been smoking about 1.00 pack per day. He has never used smokeless tobacco. He reports current alcohol use. He reports that he does not use drugs.  Allergies: No Known Allergies  Medications: I have reviewed the patient's current medications.  Results for orders placed or performed during the hospital encounter of 04/04/19  (from the past 48 hour(s))  CBC with Differential     Status: None   Collection Time: 04/04/19  9:54 AM  Result Value Ref Range   WBC 7.5 4.0 - 10.5 K/uL   RBC 4.93 4.22 - 5.81 MIL/uL   Hemoglobin 13.6 13.0 - 17.0 g/dL   HCT 26.3 78.5 - 88.5 %   MCV 85.8 80.0 - 100.0 fL   MCH 27.6 26.0 - 34.0 pg   MCHC 32.2 30.0 - 36.0 g/dL   RDW 02.7 74.1 - 28.7 %   Platelets 247 150 - 400 K/uL   nRBC 0.0 0.0 - 0.2 %   Neutrophils Relative % 70 %   Neutro Abs 5.2 1.7 - 7.7 K/uL   Lymphocytes Relative 21 %   Lymphs Abs 1.6 0.7 - 4.0 K/uL   Monocytes Relative 7 %   Monocytes Absolute 0.5 0.1 - 1.0 K/uL   Eosinophils Relative 2 %   Eosinophils Absolute 0.1 0.0 - 0.5 K/uL   Basophils Relative 0 %   Basophils Absolute 0.0 0.0 - 0.1 K/uL   Immature Granulocytes 0 %   Abs Immature Granulocytes 0.03 0.00 - 0.07 K/uL    Comment: Performed at [facility], [address], [place], [place] [address]  Basic metabolic panel     Status: Abnormal   Collection Time: 04/04/19  9:54 AM  Result Value Ref Range   Sodium 132 (L) 135 - 145 mmol/L  Potassium 4.3 3.5 - 5.1 mmol/L   Chloride 96 (L) 98 - 111 mmol/L   CO2 25 22 - 32 mmol/L   Glucose, Bld 321 (H) 70 - 99 mg/dL   BUN 13 8 - 23 mg/dL   Creatinine, Ser 1.61 0.61 - 1.24 mg/dL   Calcium 9.4 8.9 - 09.6 mg/dL   GFR calc non Af Amer >60 >60 mL/min   GFR calc Af Amer >60 >60 mL/min   Anion gap 11 5 - 15    Comment: Performed at [facility], [address] [place], [place] [address]  Hemoglobin A1c     Status: Abnormal   Collection Time: 04/04/19  9:54 AM  Result Value Ref Range   Hgb A1c MFr Bld 9.6 (H) 4.8 - 5.6 %    Comment: (NOTE) Pre diabetes:          5.7%-6.4% Diabetes:              >6.4% Glycemic control for   <7.0% adults with diabetes    Mean Plasma Glucose 228.82 mg/dL    Comment: Performed at [facility], [address], [place], [place] [address]  C-reactive protein     Status: Abnormal   Collection Time: 04/04/19 11:35 AM   Result Value Ref Range   CRP 5.4 (H) <1.0 mg/dL    Comment: Performed at [facility], [address] [place], [place] [address]  Sedimentation rate     Status: Abnormal   Collection Time: 04/04/19 11:35 AM  Result Value Ref Range   Sed Rate 76 (H) 0 - 16 mm/hr    Comment: Performed at [facility], [address], [place], [place] [address]  CBG monitoring, ED     Status: Abnormal   Collection Time: 04/04/19 12:33 PM  Result Value Ref Range   Glucose-Capillary 226 (H) 70 - 99 mg/dL  SARS CORONAVIRUS 2 (TAT 6-24 HRS) Nasopharyngeal Nasopharyngeal Swab     Status: None   Collection Time: 04/04/19  4:10 PM   Specimen: Nasopharyngeal Swab  Result Value Ref Range   SARS Coronavirus 2 NEGATIVE NEGATIVE    Comment: (NOTE) SARS-CoV-2 target nucleic acids are NOT DETECTED. The SARS-CoV-2 RNA is generally detectable in upper and lower respiratory specimens during the acute phase of infection. Negative results do not preclude SARS-CoV-2 infection, do not rule out co-infections with other pathogens, and should not be used as the sole basis for treatment or other patient management decisions. Negative results must be combined with clinical observations, patient history, and epidemiological information. The expected result is Negative. Fact Sheet for Patients: HairSlick.no Fact Sheet for Healthcare Providers: quierodirigir.com This test is not yet approved or cleared by the [place] FDA and  has been authorized for detection and/or diagnosis of SARS-CoV-2 by FDA under an Emergency Use Authorization (EUA). This EUA will remain  in effect (meaning this test can be used) for the duration of the COVID-19 declaration under Section 56 4(b)(1) of the Act, 21 U.S.C. section 360bbb-3(b)(1), unless the authorization is terminated or revoked sooner. Performed at [facility], [address] [place], [place] [address]   CBG monitoring,  ED     Status: Abnormal   Collection Time: 04/04/19  4:50 PM  Result Value Ref Range   Glucose-Capillary 205 (H) 70 - 99 mg/dL  HIV Antibody (routine testing w rflx)     Status: None   Collection Time: 04/04/19  7:01 PM  Result Value Ref Range   HIV Screen  4th Generation wRfx NON REACTIVE NON REACTIVE    Comment: Performed at [facility], [address] [place], [place] [address]  CBC     Status: None   Collection Time: 04/04/19  7:01 PM  Result Value Ref Range   WBC 8.9 4.0 - 10.5 K/uL   RBC 4.86 4.22 - 5.81 MIL/uL   Hemoglobin 13.5 13.0 - 17.0 g/dL   HCT 42.7 06.2 - 37.6 %   MCV 83.5 80.0 - 100.0 fL   MCH 27.8 26.0 - 34.0 pg   MCHC 33.3 30.0 - 36.0 g/dL   RDW 28.3 15.1 - 76.1 %   Platelets 288 150 - 400 K/uL   nRBC 0.0 0.0 - 0.2 %    Comment: Performed at [facility], [address] [place], [place] [address]  Creatinine, serum     Status: None   Collection Time: 04/04/19  7:01 PM  Result Value Ref Range   Creatinine, Ser 0.90 0.61 - 1.24 mg/dL   GFR calc non Af Amer >60 >60 mL/min   GFR calc Af Amer >60 >60 mL/min    Comment: Performed at [facility], [address], [place], [place] [address]  Glucose, capillary     Status: Abnormal   Collection Time: 04/04/19  8:54 PM  Result Value Ref Range   Glucose-Capillary 266 (H) 70 - 99 mg/dL  Basic metabolic panel     Status: Abnormal   Collection Time: 04/05/19  3:00 AM  Result Value Ref Range   Sodium 134 (L) 135 - 145 mmol/L   Potassium 3.6 3.5 - 5.1 mmol/L   Chloride 98 98 - 111 mmol/L   CO2 22 22 - 32 mmol/L   Glucose, Bld 160 (H) 70 - 99 mg/dL   BUN 10 8 - 23 mg/dL   Creatinine, Ser 9.48 0.61 - 1.24 mg/dL   Calcium 9.0 8.9 - 54.6 mg/dL   GFR calc non Af Amer >60 >60 mL/min   GFR calc Af Amer >60 >60 mL/min   Anion gap 14 5 - 15    Comment: Performed at [facility], [address], [place], [place] [address]  CBC     Status: Abnormal   Collection Time: 04/05/19  3:00 AM  Result Value  Ref Range   WBC 7.6 4.0 - 10.5 K/uL   RBC 4.48 4.22 - 5.81 MIL/uL   Hemoglobin 12.4 (L) 13.0 - 17.0 g/dL   HCT 00.9 (L) 38.1 - 82.9 %   MCV 82.8 80.0 - 100.0 fL   MCH 27.7 26.0 - 34.0 pg   MCHC 33.4 30.0 - 36.0 g/dL   RDW 93.7 16.9 - 67.8 %   Platelets 271 150 - 400 K/uL   nRBC 0.0 0.0 - 0.2 %    Comment: Performed at [facility], [address], [place], [place] [address]  Glucose, capillary     Status: Abnormal   Collection Time: 04/05/19  6:40 AM  Result Value Ref Range   Glucose-Capillary 154 (H) 70 - 99 mg/dL  Glucose, capillary     Status: Abnormal   Collection Time: 04/05/19  9:17 AM  Result Value Ref Range   Glucose-Capillary 145 (H) 70 - 99 mg/dL    Comment: Glucose reference range applies only to samples taken after fasting for at least 8 hours.  Glucose, capillary     Status: Abnormal   Collection Time: 04/05/19  1:55 PM  Result Value Ref Range  Glucose-Capillary 258 (H) 70 - 99 mg/dL    Comment: Glucose reference range applies only to samples taken after fasting for at least 8 hours.  Glucose, capillary     Status: Abnormal   Collection Time: 04/05/19  4:46 PM  Result Value Ref Range   Glucose-Capillary 222 (H) 70 - 99 mg/dL    Comment: Glucose reference range applies only to samples taken after fasting for at least 8 hours.  Glucose, capillary     Status: Abnormal   Collection Time: 04/05/19  9:47 PM  Result Value Ref Range   Glucose-Capillary 124 (H) 70 - 99 mg/dL    Comment: Glucose reference range applies only to samples taken after fasting for at least 8 hours.  CBC with Differential/Platelet     Status: Abnormal   Collection Time: 04/06/19  3:20 AM  Result Value Ref Range   WBC 6.6 4.0 - 10.5 K/uL   RBC 4.30 4.22 - 5.81 MIL/uL   Hemoglobin 11.8 (L) 13.0 - 17.0 g/dL   HCT 35.3 (L) 39.0 - 52.0 %   MCV 82.1 80.0 - 100.0 fL   MCH 27.4 26.0 - 34.0 pg   MCHC 33.4 30.0 - 36.0 g/dL   RDW 11.5 11.5 - 15.5 %   Platelets 237 150 - 400 K/uL   nRBC 0.0 0.0  - 0.2 %   Neutrophils Relative % 66 %   Neutro Abs 4.4 1.7 - 7.7 K/uL   Lymphocytes Relative 23 %   Lymphs Abs 1.5 0.7 - 4.0 K/uL   Monocytes Relative 9 %   Monocytes Absolute 0.6 0.1 - 1.0 K/uL   Eosinophils Relative 2 %   Eosinophils Absolute 0.1 0.0 - 0.5 K/uL   Basophils Relative 0 %   Basophils Absolute 0.0 0.0 - 0.1 K/uL   Immature Granulocytes 0 %   Abs Immature Granulocytes 0.01 0.00 - 0.07 K/uL    Comment: Performed at [facility], [address] [place], [place] [address]  Basic metabolic panel     Status: Abnormal   Collection Time: 04/06/19  3:20 AM  Result Value Ref Range   Sodium 133 (L) 135 - 145 mmol/L   Potassium 3.8 3.5 - 5.1 mmol/L   Chloride 98 98 - 111 mmol/L   CO2 23 22 - 32 mmol/L   Glucose, Bld 155 (H) 70 - 99 mg/dL    Comment: Glucose reference range applies only to samples taken after fasting for at least 8 hours.   BUN 8 8 - 23 mg/dL   Creatinine, Ser 0.94 0.61 - 1.24 mg/dL   Calcium 8.6 (L) 8.9 - 10.3 mg/dL   GFR calc non Af Amer >60 >60 mL/min   GFR calc Af Amer >60 >60 mL/min   Anion gap 12 5 - 15    Comment: Performed at [facility] [address], [place], [place] [address]  Glucose, capillary     Status: Abnormal   Collection Time: 04/06/19  5:43 AM  Result Value Ref Range   Glucose-Capillary 136 (H) 70 - 99 mg/dL    Comment: Glucose reference range applies only to samples taken after fasting for at least 8 hours.    PERIPHERAL VASCULAR CATHETERIZATION  Result Date: 04/05/2019 Procedure: Abdominal aortogram with bilateral lower extremity runoff, second order catheterization right external iliac artery, retrograde left common femoral puncture, ultrasound left groin Preoperative diagnosis: Gangrene right first toe left second toe Postoperative diagnosis: Same Anesthesia: Local with IV sedation Sedation time: 51 minutes  Operative findings: 1.  Bilateral popliteal artery occlusion 2.  Bilateral one-vessel posterior tibial artery  runoff Operative details: After team informed consent, the patient and the PV lab.  The patient was placed in supine position angio table.  Both groins were prepped and draped in usual sterile fashion.  Local anesthesia was infiltrated over the left common femoral artery.  Ultrasound was used to identify the left common femoral artery and femoral bifurcation.  Using ultrasound guidance an introducer needle was used to cannulate the left common femoral artery and an 035 versa core wire threaded up the abdominal aorta under fluoroscopic guidance.  Next a 5 French sheath was placed over the guidewire in the left common femoral artery.  This was thoroughly flushed with heparinized saline.  5 French pigtail catheter was then advanced over the guidewire in the abdominal aorta and abdominal aortogram was obtained in AP projection.  The left and right renal arteries are patent.  The infrarenal abdominal aorta is widely patent with no flow-limiting stenosis.  There is mild calcification.  The left and right common external and internal iliac arteries are all patent.  Next the pigtail catheter is pulled down just above the aortic bifurcation and a pelvic angiogram obtained to further clarify the above findings.  Left and right common femoral artery is also patent. Next bilateral lower extremity runoff views were obtained through the pigtail catheter. In the left lower extremity, the left common femoral profunda femoris and superficial femoral arteries are all patent.  The popliteal artery occludes above the knee.  It then reconstitutes is a very diseased below-knee popliteal artery and a diminutive peroneal artery.  The origins of the anterior tibial and posterior tibial artery are occluded.  The posterior tibial artery then reconstitutes and is single-vessel runoff to the left foot.  Additional stationary views were obtained from the below-knee segment of the foot including the lateral foot view to further clarify the  anatomy on the left side. In the right lower extremity there are similar findings with one-vessel posterior tibial artery runoff a very diseased popliteal artery there is a diminutive peroneal artery and the anterior tibial artery occludes in the proximal leg. At this point the pigtail catheter was removed.  A 5 French crossover catheter was exchanged over the guidewire and the right common iliac followed by the right external iliac artery selected and the crossover catheter swapped out for 5 French straight catheter.  Additional lower extremity views on the right side were made to further clarify the below-knee popliteal and tibial vessels as well as lateral foot view. Patient tired procedure well and there were no complications.  Patient was taken to the holding area in stable condition after the straight catheter was removed.  The 5 French sheath was left in place to be pulled the holding area. Operative management: Patient will be scheduled in the near future for a right femoral to posterior tibial artery bypass with the proximal target site determined by adequacy of venous conduit.  The patient will also need amputation of his first toe.  He will be maintained on vancomycin and Zosyn antibiotic coverage until the time of his bypass procedure. He will be started on aspirin 81 mg once daily and Crestor 10 mg once daily for long-term management of his peripheral arterial disease. Fabienne Brunsharles Fields, MD [facility] Office: [phone]   DG Foot Complete Right  Result Date: 04/04/2019 CLINICAL DATA:  Right foot pain with great toe ulcer. EXAM: RIGHT FOOT  COMPLETE - 3+ VIEW COMPARISON:  None. FINDINGS: Soft tissue defect noted great toe, compatible with the reported history of ulcer. No underlying bony erosion/destruction in the phalanges of the great toe to suggest osteomyelitis. No evidence for fracture or dislocation. Mild degenerative changes noted MTP joint great toe. IMPRESSION:  Soft tissue ulcer noted in the great toe without underlying bony destruction to suggest osteomyelitis by x-ray. Electronically Signed   By: Kennith Center M.D.   On: 04/04/2019 09:54   VAS [place] LOWER EXTREMITY SAPHENOUS VEIN MAPPING  Result Date: 04/05/2019 LOWER EXTREMITY VEIN MAPPING Indications:       Pre-op Other Indications: Status post arteriogram revealed bilateral popliteal artery                    occlusion with one-vessel PTA runoff bilaterally. Risk Factors:      Hypertension, Diabetes, current smoker.  Performing Technologist: Marilynne Halsted RDMS, RVT  Examination Guidelines: A complete evaluation includes B-mode imaging, spectral Doppler, color Doppler, and power Doppler as needed of all accessible portions of each vessel. Bilateral testing is considered an integral part of a complete examination. Limited examinations for reoccurring indications may be performed as noted. +--------------+--------------+-------------------+-------------+--------------+  RT Diameter   RT Findings          GSV         LT Diameter  LT Findings        (cm)                                          (cm)                    +--------------+--------------+-------------------+-------------+--------------+      3.90     branching and   Saphenofemoral       3.00                                   Medial vein       Junction                                                    mapped                                                   +--------------+--------------+-------------------+-------------+--------------+      4.40                     Proximal thigh       2.90                    +--------------+--------------+-------------------+-------------+--------------+      2.50                        Mid thigh         2.70                    +--------------+--------------+-------------------+-------------+--------------+      2.20  Distal thigh        2.60                     +--------------+--------------+-------------------+-------------+--------------+      2.60                          Knee            2.70                    +--------------+--------------+-------------------+-------------+--------------+      2.50       branching        Prox calf         1.50        chronic                                                                    thrombus    +--------------+--------------+-------------------+-------------+--------------+      1.80                        Mid calf          1.90        chronic                                                                    thrombus    +--------------+--------------+-------------------+-------------+--------------+      1.70                       Distal calf        1.20        chronic                                                                    thrombus    +--------------+--------------+-------------------+-------------+--------------+      2.10                          Ankle           1.30        chronic                                                                    thrombus    +--------------+--------------+-------------------+-------------+--------------+ Diagnosing physician: Fabienne Bruns MD Electronically signed by Fabienne Bruns MD on 04/05/2019 at 11:44:29 AM.    Final    VAS [place] UPPER EXT VEIN MAPPING (PRE-OP AVF)  Result Date: 04/05/2019 UPPER EXTREMITY VEIN MAPPING  Indications: History of PAD; patient is pre-operative for bypass. Comparison Study: No prior study Performing Technologist: Gertie FeyMichelle Simonetti MHA, RDMS, RVT, RDCS  Examination Guidelines: A complete evaluation includes B-mode imaging, spectral Doppler, color Doppler, and power Doppler as needed of all accessible portions of each vessel. Bilateral testing is considered an integral part of a complete examination. Limited examinations for reoccurring indications may be performed as noted.  +-----------------+-------------+----------+--------+ Right Cephalic   Diameter (cm)Depth (cm)Findings +-----------------+-------------+----------+--------+ Shoulder             0.26                        +-----------------+-------------+----------+--------+ Prox upper arm       0.22                        +-----------------+-------------+----------+--------+ Mid upper arm        0.16                        +-----------------+-------------+----------+--------+ Dist upper arm       0.19                        +-----------------+-------------+----------+--------+ Antecubital fossa    0.22                        +-----------------+-------------+----------+--------+ Prox forearm         0.28               thrombus +-----------------+-------------+----------+--------+ Mid forearm          0.17                        +-----------------+-------------+----------+--------+ Dist forearm         0.17                        +-----------------+-------------+----------+--------+ +-----------------+-------------+----------+--------------+ Right Basilic    Diameter (cm)Depth (cm)   Findings    +-----------------+-------------+----------+--------------+ Prox upper arm                          not visualized +-----------------+-------------+----------+--------------+ Mid upper arm                           not visualized +-----------------+-------------+----------+--------------+ Dist upper arm                          not visualized +-----------------+-------------+----------+--------------+ Antecubital fossa                       not visualized +-----------------+-------------+----------+--------------+ Prox forearm                            not visualized +-----------------+-------------+----------+--------------+ Mid forearm                             not visualized +-----------------+-------------+----------+--------------+ Distal forearm                           not visualized +-----------------+-------------+----------+--------------+ Wrist  not visualized +-----------------+-------------+----------+--------------+ +-----------------+-------------+----------+--------------+ Left Cephalic    Diameter (cm)Depth (cm)   Findings    +-----------------+-------------+----------+--------------+ Shoulder             0.24                              +-----------------+-------------+----------+--------------+ Prox upper arm       0.19                              +-----------------+-------------+----------+--------------+ Mid upper arm        0.14                 branching    +-----------------+-------------+----------+--------------+ Dist upper arm       0.13                              +-----------------+-------------+----------+--------------+ Antecubital fossa    0.25                              +-----------------+-------------+----------+--------------+ Prox forearm         0.15                 branching    +-----------------+-------------+----------+--------------+ Mid forearm                             not visualized +-----------------+-------------+----------+--------------+ Dist forearm                            not visualized +-----------------+-------------+----------+--------------+ Wrist                                   not visualized +-----------------+-------------+----------+--------------+ +-----------------+-------------+----------+--------------+ Left Basilic     Diameter (cm)Depth (cm)   Findings    +-----------------+-------------+----------+--------------+ Prox upper arm                          not visualized +-----------------+-------------+----------+--------------+ Mid upper arm        0.21                              +-----------------+-------------+----------+--------------+ Dist upper arm       0.20                               +-----------------+-------------+----------+--------------+ Antecubital fossa    0.30                              +-----------------+-------------+----------+--------------+ Prox forearm         0.20                              +-----------------+-------------+----------+--------------+ Mid forearm          0.18                              +-----------------+-------------+----------+--------------+ Distal forearm  0.22                              +-----------------+-------------+----------+--------------+ Wrist                                   not visualized +-----------------+-------------+----------+--------------+ *See table(s) above for measurements and observations.  Diagnosing physician: Lemar Livings MD Electronically signed by Lemar Livings MD on 04/05/2019 at 6:10:29 PM.    Final     Review of Systems Blood pressure 99/69, pulse 71, temperature 98.2 F (36.8 C), temperature source Oral, resp. rate 15, height 6' (1.829 m), weight 69.4 kg, SpO2 99 %. Physical Exam General: AAO x3, NAD- sitting in bed eating  Dermatological: On the right side there is dry gangrene present the right hallux and there is hyperpigmented changes to the medial aspect of the foot.  Mild surrounding erythema.  No ascending cellulitis, fluctuation or crepitation. On the left hallux the distal aspect is a small eschar and mild swelling to the toe.  This actually appears to be improved although mildly.  There is no other open lesions identified.  Vascular: Absent pulses.  Musculoskeletal: No tenderness at this time.  Assessment/Plan: Dry gangrene with cellulitis  Underwent angiogram this morning is scheduled for bypass on Thursday on the right side.  Is going to need at least first ray amputation and hopefully able to salvage the rest of the foot.  Patient is fearful about loss of leg which we discussed as a possibility.  Hopefully the circulation can improve and able to save the  right foot.  On the left side were to continue monitor closely.  Continue antibiotics for now.  Vivi Barrack 04/06/2019, 6:53 AM

## 2019-04-06 NOTE — Progress Notes (Signed)
Bilateral ABI exam completed.  Results discussed with Dr. Randie Heinz.  Preliminary results can be found under CV proc under chart review.  04/06/2019 12:14 PM  Ayahna Solazzo, K., RDMS, RVT

## 2019-04-06 NOTE — Plan of Care (Signed)
Continue to monitor

## 2019-04-06 NOTE — Progress Notes (Addendum)
  Progress Note    04/06/2019 7:32 AM 1 Day Post-Op  Subjective:  Throbbing pain in right 1st toe. No other complaints   Vitals:   04/06/19 0544 04/06/19 0547  BP:  99/69  Pulse:  71  Resp:  15  Temp: 98.2 F (36.8 C)   SpO2:  99%   Physical Exam: General: well appearing, well nourished, looks comfortable Lungs:  Non labored Extremities:  2+ femoral pulses bilaterally, no palpable distal pulses. Doppler PT bilaterally. Left femoral access site, clean, dry. No swelling or hematoma. Non tender. Dry gangrene of right 1st toe. Dry gangrenous changes of left 2nd toe. Non tender. Motor intact. Bilateral feet warm Abdomen:  Soft nontender Neurologic: Alert and oriented  CBC    Component Value Date/Time   WBC 6.6 04/06/2019 0320   RBC 4.30 04/06/2019 0320   HGB 11.8 (L) 04/06/2019 0320   HCT 35.3 (L) 04/06/2019 0320   HCT 44 (A) 11/14/2015 0758   PLT 237 04/06/2019 0320   MCV 82.1 04/06/2019 0320   MCV 87.8 11/14/2015 0758   MCH 27.4 04/06/2019 0320   MCHC 33.4 04/06/2019 0320   RDW 11.5 04/06/2019 0320   RDW 13.7 11/14/2015 0758   LYMPHSABS 1.5 04/06/2019 0320   MONOABS 0.6 04/06/2019 0320   EOSABS 0.1 04/06/2019 0320   EOSABS 180 11/14/2015 0758   BASOSABS 0.0 04/06/2019 0320    BMET    Component Value Date/Time   NA 133 (L) 04/06/2019 0320   NA 137 11/14/2015 0758   K 3.8 04/06/2019 0320   K 4.1 11/14/2015 0758   CL 98 04/06/2019 0320   CL 102 11/14/2015 0758   CO2 23 04/06/2019 0320   CO2 26 11/14/2015 0758   GLUCOSE 155 (H) 04/06/2019 0320   BUN 8 04/06/2019 0320   CREATININE 0.94 04/06/2019 0320   CREATININE 1.03 11/14/2015 0758   CALCIUM 8.6 (L) 04/06/2019 0320   CALCIUM 9.1 11/14/2015 0758   GFRNONAA >60 04/06/2019 0320   GFRAA >60 04/06/2019 0320    INR No results found for: INR   Intake/Output Summary (Last 24 hours) at 04/06/2019 0732 Last data filed at 04/06/2019 0700 Gross per 24 hour  Intake 1850.76 ml  Output 1225 ml  Net 625.76 ml      Assessment/Plan:  63 y.o. male is s/p Abdominal aortogram with bilateral lower extremity runoff by Dr. Darrick Penna for gangrene of right first toe and left 2nd toe. 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 He is scheduled to have right femoral to PT bypass tomorrow 04/07/19 and amputation of his right 1st toe. Does not appear to have good venous conduits. He will continue zosyn and Vancomycin. NPO after midnight and consent orders placed   DVT prophylaxis:  SQ Heparin   Graceann Congress, PA-C [facility] [phone] 04/06/2019 7:32 AM   Agree with above.  Bedside exam of right GSV looks usable as conduit Foot is much more ruborous with erythema and livedo today Toe gangrene unchanged.  D/w pt bypass and toe amp plan for tomorrow.  Discussed he is still very high risk for limb loss but will atttempt bypass for limb salvage.  Fabienne Bruns, MD [facility] [place] Office: [phone]

## 2019-04-07 ENCOUNTER — Encounter (HOSPITAL_COMMUNITY)
Admission: EM | Disposition: A | Discharge status: Home Health | Payer: Self-pay | Source: Home / Self Care | Attending: Internal Medicine

## 2019-04-07 ENCOUNTER — Inpatient Hospital Stay (HOSPITAL_COMMUNITY): Payer: Medicare Other

## 2019-04-07 DIAGNOSIS — I10 Essential (primary) hypertension: Secondary | ICD-10-CM | POA: Diagnosis not present

## 2019-04-07 DIAGNOSIS — E222 Syndrome of inappropriate secretion of antidiuretic hormone: Secondary | ICD-10-CM | POA: Diagnosis not present

## 2019-04-07 DIAGNOSIS — L97519 Non-pressure chronic ulcer of other part of right foot with unspecified severity: Secondary | ICD-10-CM

## 2019-04-07 DIAGNOSIS — E11621 Type 2 diabetes mellitus with foot ulcer: Secondary | ICD-10-CM | POA: Diagnosis not present

## 2019-04-07 DIAGNOSIS — I96 Gangrene, not elsewhere classified: Secondary | ICD-10-CM | POA: Diagnosis not present

## 2019-04-07 DIAGNOSIS — L97509 Non-pressure chronic ulcer of other part of unspecified foot with unspecified severity: Secondary | ICD-10-CM | POA: Diagnosis not present

## 2019-04-07 DIAGNOSIS — L03031 Cellulitis of right toe: Secondary | ICD-10-CM | POA: Diagnosis not present

## 2019-04-07 DIAGNOSIS — E1152 Type 2 diabetes mellitus with diabetic peripheral angiopathy with gangrene: Secondary | ICD-10-CM | POA: Diagnosis not present

## 2019-04-07 HISTORY — PX: AMPUTATION: SHX166

## 2019-04-07 HISTORY — PX: FEMORAL-TIBIAL BYPASS GRAFT: SHX938

## 2019-04-07 LAB — GLUCOSE, CAPILLARY
Glucose-Capillary: 132 mg/dL — ABNORMAL HIGH (ref 70–99)
Glucose-Capillary: 128 mg/dL — ABNORMAL HIGH (ref 70–99)
Glucose-Capillary: 134 mg/dL — ABNORMAL HIGH (ref 70–99)
Glucose-Capillary: 107 mg/dL — ABNORMAL HIGH (ref 70–99)
Glucose-Capillary: 228 mg/dL — ABNORMAL HIGH (ref 70–99)
Glucose-Capillary: 204 mg/dL — ABNORMAL HIGH (ref 70–99)

## 2019-04-07 SURGERY — CREATION, BYPASS, ARTERIAL, FEMORAL TO TIBIAL, USING GRAFT
Anesthesia: General | Site: Leg Lower | Laterality: Right

## 2019-04-07 MED ORDER — MAGNESIUM SULFATE 2 GM/50ML IV SOLN: 2.0000 g | Freq: Every day | INTRAVENOUS | Status: DC | PRN

## 2019-04-07 MED ORDER — HEPARIN SODIUM (PORCINE) 1000 UNIT/ML IJ SOLN
INTRAMUSCULAR | Status: AC
  Filled 2019-04-07: qty 1

## 2019-04-07 MED ORDER — GLIPIZIDE 5 MG PO TABS
2.5000 mg | ORAL_TABLET | Freq: Every day | ORAL | Status: DC
  Administered 2019-04-08: 2.5 mg via ORAL
  Filled 2019-04-07: qty 1

## 2019-04-07 MED ORDER — FENTANYL CITRATE (PF) 250 MCG/5ML IJ SOLN
INTRAMUSCULAR | Status: DC | PRN
  Administered 2019-04-07 (×5): 50 ug via INTRAVENOUS

## 2019-04-07 MED ORDER — DOCUSATE SODIUM 100 MG PO CAPS
100.0000 mg | ORAL_CAPSULE | Freq: Every day | ORAL | Status: DC
  Administered 2019-04-08 – 2019-04-09 (×2): 100 mg via ORAL
  Filled 2019-04-07 (×2): qty 1

## 2019-04-07 MED ORDER — ROCURONIUM BROMIDE 10 MG/ML (PF) SYRINGE
PREFILLED_SYRINGE | INTRAVENOUS | Status: DC | PRN
  Administered 2019-04-07: 50 mg via INTRAVENOUS
  Administered 2019-04-07: 20 mg via INTRAVENOUS

## 2019-04-07 MED ORDER — HEMOSTATIC AGENTS (NO CHARGE) OPTIME
TOPICAL | Status: DC | PRN
  Administered 2019-04-07: 1 via TOPICAL

## 2019-04-07 MED ORDER — LIDOCAINE 2% (20 MG/ML) 5 ML SYRINGE
INTRAMUSCULAR | Status: DC | PRN
  Administered 2019-04-07: 80 mg via INTRAVENOUS

## 2019-04-07 MED ORDER — HEPARIN SODIUM (PORCINE) 1000 UNIT/ML IJ SOLN
INTRAMUSCULAR | Status: DC | PRN
  Administered 2019-04-07: 7000 [IU] via INTRAVENOUS

## 2019-04-07 MED ORDER — PROPOFOL 10 MG/ML IV BOLUS
INTRAVENOUS | Status: DC | PRN
  Administered 2019-04-07: 170 mg via INTRAVENOUS

## 2019-04-07 MED ORDER — FENTANYL CITRATE (PF) 250 MCG/5ML IJ SOLN
INTRAMUSCULAR | Status: AC
  Filled 2019-04-07: qty 5

## 2019-04-07 MED ORDER — METOPROLOL TARTRATE 5 MG/5ML IV SOLN: 2.0000 mg | INTRAVENOUS | Status: DC | PRN

## 2019-04-07 MED ORDER — PHENYLEPHRINE 40 MCG/ML (10ML) SYRINGE FOR IV PUSH (FOR BLOOD PRESSURE SUPPORT)
PREFILLED_SYRINGE | INTRAVENOUS | Status: AC
  Filled 2019-04-07: qty 10

## 2019-04-07 MED ORDER — PHENOL 1.4 % MT LIQD: 1.0000 | OROMUCOSAL | Status: DC | PRN

## 2019-04-07 MED ORDER — CHLORHEXIDINE GLUCONATE CLOTH 2 % EX PADS
6.0000 | MEDICATED_PAD | Freq: Every day | CUTANEOUS | Status: DC
  Administered 2019-04-07 – 2019-04-09 (×3): 6 via TOPICAL

## 2019-04-07 MED ORDER — POTASSIUM CHLORIDE CRYS ER 20 MEQ PO TBCR: 20.0000 meq | EXTENDED_RELEASE_TABLET | Freq: Every day | ORAL | Status: DC | PRN

## 2019-04-07 MED ORDER — SODIUM CHLORIDE 0.9 % IV SOLN
INTRAVENOUS | Status: DC | PRN
  Administered 2019-04-07: 500 mL

## 2019-04-07 MED ORDER — PHENYLEPHRINE HCL-NACL 10-0.9 MG/250ML-% IV SOLN
INTRAVENOUS | Status: DC | PRN
  Administered 2019-04-07: 20 ug/min via INTRAVENOUS

## 2019-04-07 MED ORDER — GUAIFENESIN-DM 100-10 MG/5ML PO SYRP: 15.0000 mL | ORAL_SOLUTION | ORAL | Status: DC | PRN

## 2019-04-07 MED ORDER — HEPARIN SODIUM (PORCINE) 5000 UNIT/ML IJ SOLN
5000.0000 [IU] | Freq: Three times a day (TID) | INTRAMUSCULAR | Status: DC
  Administered 2019-04-08 – 2019-04-09 (×4): 5000 [IU] via SUBCUTANEOUS
  Filled 2019-04-07 (×4): qty 1

## 2019-04-07 MED ORDER — PANTOPRAZOLE SODIUM 40 MG PO TBEC
40.0000 mg | DELAYED_RELEASE_TABLET | Freq: Every day | ORAL | Status: DC
  Administered 2019-04-07 – 2019-04-09 (×3): 40 mg via ORAL
  Filled 2019-04-07 (×3): qty 1

## 2019-04-07 MED ORDER — 0.9 % SODIUM CHLORIDE (POUR BTL) OPTIME
TOPICAL | Status: DC | PRN
  Administered 2019-04-07: 2000 mL

## 2019-04-07 MED ORDER — LACTATED RINGERS IV SOLN: INTRAVENOUS | Status: DC

## 2019-04-07 MED ORDER — PROTAMINE SULFATE 10 MG/ML IV SOLN
INTRAVENOUS | Status: AC
  Filled 2019-04-07: qty 5

## 2019-04-07 MED ORDER — MIDAZOLAM HCL 2 MG/2ML IJ SOLN
INTRAMUSCULAR | Status: AC
  Filled 2019-04-07: qty 2

## 2019-04-07 MED ORDER — SODIUM CHLORIDE 0.9 % IV SOLN: 500.0000 mL | Freq: Once | INTRAVENOUS | Status: DC | PRN

## 2019-04-07 MED ORDER — LABETALOL HCL 5 MG/ML IV SOLN: 10.0000 mg | INTRAVENOUS | Status: DC | PRN

## 2019-04-07 MED ORDER — PROPOFOL 1000 MG/100ML IV EMUL
INTRAVENOUS | Status: AC
  Filled 2019-04-07: qty 100

## 2019-04-07 MED ORDER — LIDOCAINE 2% (20 MG/ML) 5 ML SYRINGE
INTRAMUSCULAR | Status: AC
  Filled 2019-04-07: qty 5

## 2019-04-07 MED ORDER — PROPOFOL 10 MG/ML IV BOLUS
INTRAVENOUS | Status: AC
  Filled 2019-04-07: qty 40

## 2019-04-07 MED ORDER — FENTANYL CITRATE (PF) 100 MCG/2ML IJ SOLN: 25.0000 ug | INTRAMUSCULAR | Status: DC | PRN

## 2019-04-07 MED ORDER — SODIUM CHLORIDE 0.9 % IV SOLN
INTRAVENOUS | Status: AC
  Filled 2019-04-07: qty 1.2

## 2019-04-07 MED ORDER — SUCCINYLCHOLINE CHLORIDE 200 MG/10ML IV SOSY
PREFILLED_SYRINGE | INTRAVENOUS | Status: DC | PRN
  Administered 2019-04-07: 120 mg via INTRAVENOUS

## 2019-04-07 MED ORDER — PHENYLEPHRINE 40 MCG/ML (10ML) SYRINGE FOR IV PUSH (FOR BLOOD PRESSURE SUPPORT)
PREFILLED_SYRINGE | INTRAVENOUS | Status: DC | PRN
  Administered 2019-04-07: 120 ug via INTRAVENOUS

## 2019-04-07 MED ORDER — PROTAMINE SULFATE 10 MG/ML IV SOLN
INTRAVENOUS | Status: DC | PRN
  Administered 2019-04-07: 50 mg via INTRAVENOUS

## 2019-04-07 MED ORDER — ALUM & MAG HYDROXIDE-SIMETH 200-200-20 MG/5ML PO SUSP: 15.0000 mL | ORAL | Status: DC | PRN

## 2019-04-07 MED ORDER — ONDANSETRON HCL 4 MG/2ML IJ SOLN
INTRAMUSCULAR | Status: DC | PRN
  Administered 2019-04-07: 4 mg via INTRAVENOUS

## 2019-04-07 MED ORDER — HYDRALAZINE HCL 20 MG/ML IJ SOLN: 5.0000 mg | INTRAMUSCULAR | Status: DC | PRN

## 2019-04-07 MED ORDER — MIDAZOLAM HCL 2 MG/2ML IJ SOLN
INTRAMUSCULAR | Status: DC | PRN
  Administered 2019-04-07: 2 mg via INTRAVENOUS

## 2019-04-07 MED ORDER — SUGAMMADEX SODIUM 200 MG/2ML IV SOLN
INTRAVENOUS | Status: DC | PRN
  Administered 2019-04-07: 140 mg via INTRAVENOUS

## 2019-04-07 MED ORDER — ONDANSETRON HCL 4 MG/2ML IJ SOLN
4.0000 mg | Freq: Four times a day (QID) | INTRAMUSCULAR | Status: DC | PRN
Start: 2019-04-07 — End: 2019-04-10

## 2019-04-07 MED ORDER — SUCCINYLCHOLINE CHLORIDE 200 MG/10ML IV SOSY
PREFILLED_SYRINGE | INTRAVENOUS | Status: AC
  Filled 2019-04-07: qty 10

## 2019-04-07 SURGICAL SUPPLY — 71 items
ADH SKN CLS APL DERMABOND .7 (GAUZE/BANDAGES/DRESSINGS) ×8
BANDAGE ESMARK 6X9 LF (GAUZE/BANDAGES/DRESSINGS) IMPLANT
BLADE AVERAGE 25MMX9MM (BLADE) ×1
BLADE AVERAGE 25X9 (BLADE) ×1 IMPLANT
BLADE SAW SGTL 81X20 HD (BLADE) IMPLANT
BNDG CMPR 9X6 STRL LF SNTH (GAUZE/BANDAGES/DRESSINGS)
BNDG ELASTIC 4X5.8 VLCR STR LF (GAUZE/BANDAGES/DRESSINGS) ×4 IMPLANT
BNDG ESMARK 6X9 LF (GAUZE/BANDAGES/DRESSINGS)
BNDG GAUZE ELAST 4 BULKY (GAUZE/BANDAGES/DRESSINGS) ×4 IMPLANT
CANISTER SUCT 3000ML PPV (MISCELLANEOUS) ×4 IMPLANT
CANNULA VESSEL 3MM 2 BLNT TIP (CANNULA) IMPLANT
CLIP VESOCCLUDE MED 24/CT (CLIP) ×4 IMPLANT
CLIP VESOCCLUDE SM WIDE 24/CT (CLIP) ×4 IMPLANT
COVER PROBE W GEL 5X96 (DRAPES) IMPLANT
COVER SURGICAL LIGHT HANDLE (MISCELLANEOUS) ×2 IMPLANT
COVER WAND RF STERILE (DRAPES) ×2 IMPLANT
CUFF TOURN SGL QUICK 24 (TOURNIQUET CUFF)
CUFF TOURN SGL QUICK 34 (TOURNIQUET CUFF)
CUFF TOURN SGL QUICK 42 (TOURNIQUET CUFF) IMPLANT
CUFF TRNQT CYL 24X4X16.5-23 (TOURNIQUET CUFF) IMPLANT
CUFF TRNQT CYL 34X4.125X (TOURNIQUET CUFF) IMPLANT
DERMABOND ADVANCED (GAUZE/BANDAGES/DRESSINGS) ×8
DERMABOND ADVANCED .7 DNX12 (GAUZE/BANDAGES/DRESSINGS) ×2 IMPLANT
DRAIN CHANNEL 15F RND FF W/TCR (WOUND CARE) IMPLANT
DRAPE C-ARM 42X72 X-RAY (DRAPES) IMPLANT
DRAPE EXTREMITY T 121X128X90 (DISPOSABLE) ×2 IMPLANT
DRAPE HALF SHEET 40X57 (DRAPES) ×4 IMPLANT
ELECT REM PT RETURN 9FT ADLT (ELECTROSURGICAL) ×4
ELECTRODE REM PT RTRN 9FT ADLT (ELECTROSURGICAL) ×2 IMPLANT
EVACUATOR SILICONE 100CC (DRAIN) IMPLANT
GAUZE SPONGE 4X4 12PLY STRL (GAUZE/BANDAGES/DRESSINGS) ×2 IMPLANT
GAUZE SPONGE 4X4 12PLY STRL LF (GAUZE/BANDAGES/DRESSINGS) ×2 IMPLANT
GLOVE BIO SURGEON STRL SZ7.5 (GLOVE) ×4 IMPLANT
GLOVE BIOGEL PI IND STRL 6.5 (GLOVE) IMPLANT
GLOVE BIOGEL PI INDICATOR 6.5 (GLOVE) ×4
GLOVE SKINSENSE NS SZ6.5 (GLOVE) ×2
GLOVE SKINSENSE STRL SZ6.5 (GLOVE) IMPLANT
GOWN STRL NON-REIN LRG LVL3 (GOWN DISPOSABLE) ×2 IMPLANT
GOWN STRL REUS W/ TWL LRG LVL3 (GOWN DISPOSABLE) ×4 IMPLANT
GOWN STRL REUS W/ TWL XL LVL3 (GOWN DISPOSABLE) ×2 IMPLANT
GOWN STRL REUS W/TWL LRG LVL3 (GOWN DISPOSABLE) ×8
GOWN STRL REUS W/TWL XL LVL3 (GOWN DISPOSABLE) ×4
HEMOSTAT SNOW SURGICEL 2X4 (HEMOSTASIS) ×2 IMPLANT
INSERT FOGARTY SM (MISCELLANEOUS) IMPLANT
KIT BASIN OR (CUSTOM PROCEDURE TRAY) ×4 IMPLANT
KIT TURNOVER KIT B (KITS) ×4 IMPLANT
MARKER GRAFT CORONARY BYPASS (MISCELLANEOUS) IMPLANT
NDL HYPO 25GX1X1/2 BEV (NEEDLE) IMPLANT
NEEDLE HYPO 25GX1X1/2 BEV (NEEDLE) IMPLANT
NS IRRIG 1000ML POUR BTL (IV SOLUTION) ×8 IMPLANT
PACK GENERAL/GYN (CUSTOM PROCEDURE TRAY) ×2 IMPLANT
PACK PERIPHERAL VASCULAR (CUSTOM PROCEDURE TRAY) ×4 IMPLANT
PAD ARMBOARD 7.5X6 YLW CONV (MISCELLANEOUS) ×8 IMPLANT
SET COLLECT BLD 21X3/4 12 (NEEDLE) IMPLANT
SPONGE LAP 18X18 RF (DISPOSABLE) ×2 IMPLANT
SUT ETHILON 3 0 PS 1 (SUTURE) ×6 IMPLANT
SUT MNCRL AB 4-0 PS2 18 (SUTURE) ×12 IMPLANT
SUT PROLENE 5 0 C 1 24 (SUTURE) ×6 IMPLANT
SUT PROLENE 6 0 BV (SUTURE) ×22 IMPLANT
SUT SILK 2 0 SH (SUTURE) ×4 IMPLANT
SUT SILK 3 0 (SUTURE) ×8
SUT SILK 3-0 18XBRD TIE 12 (SUTURE) IMPLANT
SUT VIC AB 2-0 CT1 27 (SUTURE) ×4
SUT VIC AB 2-0 CT1 TAPERPNT 27 (SUTURE) ×4 IMPLANT
SUT VIC AB 3-0 SH 27 (SUTURE) ×8
SUT VIC AB 3-0 SH 27X BRD (SUTURE) ×4 IMPLANT
SYR CONTROL 10ML LL (SYRINGE) IMPLANT
TOWEL GREEN STERILE (TOWEL DISPOSABLE) ×8 IMPLANT
TRAY FOLEY MTR SLVR 16FR STAT (SET/KITS/TRAYS/PACK) ×4 IMPLANT
UNDERPAD 30X30 (UNDERPADS AND DIAPERS) ×4 IMPLANT
WATER STERILE IRR 1000ML POUR (IV SOLUTION) ×4 IMPLANT

## 2019-04-07 NOTE — Anesthesia Procedure Notes (Signed)
Procedure Name: Intubation Performed by: Evalisse Prajapati B, CRNA Pre-anesthesia Checklist: Patient identified, Emergency Drugs available, Suction available and Patient being monitored Patient Re-evaluated:Patient Re-evaluated prior to induction Oxygen Delivery Method: Circle System Utilized Preoxygenation: Pre-oxygenation with 100% oxygen Induction Type: IV induction Ventilation: Mask ventilation without difficulty Laryngoscope Size: Mac and 4 Grade View: Grade I Tube type: Oral Tube size: 7.5 mm Number of attempts: 1 Airway Equipment and Method: Stylet and Oral airway Placement Confirmation: ETT inserted through vocal cords under direct vision,  positive ETCO2 and breath sounds checked- equal and bilateral Secured at: 22 cm Tube secured with: Tape Dental Injury: Teeth and Oropharynx as per pre-operative assessment        

## 2019-04-07 NOTE — Anesthesia Preprocedure Evaluation (Addendum)
Anesthesia Evaluation  Patient identified by MRN, date of birth, ID band Patient awake    Reviewed: Allergy & Precautions, NPO status , Patient's Chart, lab work & pertinent test results  Airway Mallampati: II  TM Distance: >3 FB     Dental   Pulmonary Current Smoker and Patient abstained from smoking.,    breath sounds clear to auscultation       Cardiovascular hypertension,  Rhythm:Regular Rate:Normal     Neuro/Psych Anxiety    GI/Hepatic negative GI ROS, Neg liver ROS,   Endo/Other  diabetes  Renal/GU negative Renal ROS     Musculoskeletal   Abdominal   Peds  Hematology   Anesthesia Other Findings   Reproductive/Obstetrics                             Anesthesia Physical Anesthesia Plan  ASA: III  Anesthesia Plan: General   Post-op Pain Management:    Induction: Intravenous  PONV Risk Score and Plan: 2 and Ondansetron and Midazolam  Airway Management Planned: Nasal Cannula and Simple Face Mask  Additional Equipment:   Intra-op Plan:   Post-operative Plan:   Informed Consent: I have reviewed the patients History and Physical, chart, labs and discussed the procedure including the risks, benefits and alternatives for the proposed anesthesia with the patient or authorized representative who has indicated his/her understanding and acceptance.     Dental advisory given  Plan Discussed with: CRNA and Anesthesiologist  Anesthesia Plan Comments:         Anesthesia Quick Evaluation

## 2019-04-07 NOTE — Anesthesia Postprocedure Evaluation (Signed)
Anesthesia Post Note  Patient: Juan Turner  Procedure(s) Performed: LEFT FEMORAL POSTERIOR TIBIAL BYPASS (Right Leg Lower) AMPUTATION DIGIT RIGHT FIRST AND SECOND TOES (Right Foot)     Patient location during evaluation: PACU Anesthesia Type: General Level of consciousness: awake and alert Pain management: pain level controlled Vital Signs Assessment: post-procedure vital signs reviewed and stable Respiratory status: spontaneous breathing, nonlabored ventilation, respiratory function stable and patient connected to nasal cannula oxygen Cardiovascular status: blood pressure returned to baseline and stable Postop Assessment: no apparent nausea or vomiting Anesthetic complications: no    Last Vitals:  Vitals:   04/07/19 1615 04/07/19 1621  BP: 114/77   Pulse: 84   Resp: 20 15  Temp: (P) 37.3 C   SpO2: 100%     Last Pain:  Vitals:   04/07/19 0748  TempSrc: Oral  PainSc:                  Daniela Hernan P Valerye Kobus

## 2019-04-07 NOTE — Progress Notes (Signed)
Orthopedic Tech Progress Note Patient Details:  Juan Turner 03-13-1955 [id number]  Ortho Devices Type of Ortho Device: Darco shoe Ortho Device/Splint Location: right Ortho Device/Splint Interventions: Application   Post Interventions Patient Tolerated: Well Instructions Provided: Care of device   [name] 04/07/2019, 6:12 PM

## 2019-04-07 NOTE — Progress Notes (Signed)
  Progress Note    04/07/2019 11:43 AM Day of Surgery  Subjective:  No overnight issues, nervous for surgery  Vitals:   04/07/19 0419 04/07/19 0748  BP: (!) 154/80 (!) 187/79  Pulse: 65 72  Resp: 19 18  Temp: 97.9 F (36.6 C) 97.6 F (36.4 C)  SpO2: 100% 96%    Physical Exam: [id number] Non labored respirations 2+ right femoral pulse Large saphenous vein noted right leg Dressing right foot cdi  CBC    Component Value Date/Time   WBC 6.6 04/06/2019 0320   RBC 4.30 04/06/2019 0320   HGB 11.8 (L) 04/06/2019 0320   HCT 35.3 (L) 04/06/2019 0320   HCT 44 (A) 11/14/2015 0758   PLT 237 04/06/2019 0320   MCV 82.1 04/06/2019 0320   MCV 87.8 11/14/2015 0758   MCH 27.4 04/06/2019 0320   MCHC 33.4 04/06/2019 0320   RDW 11.5 04/06/2019 0320   RDW 13.7 11/14/2015 0758   LYMPHSABS 1.5 04/06/2019 0320   MONOABS 0.6 04/06/2019 0320   EOSABS 0.1 04/06/2019 0320   EOSABS 180 11/14/2015 0758   BASOSABS 0.0 04/06/2019 0320    BMET    Component Value Date/Time   NA 133 (L) 04/06/2019 0320   NA 137 11/14/2015 0758   K 3.8 04/06/2019 0320   K 4.1 11/14/2015 0758   CL 98 04/06/2019 0320   CL 102 11/14/2015 0758   CO2 23 04/06/2019 0320   CO2 26 11/14/2015 0758   GLUCOSE 155 (H) 04/06/2019 0320   BUN 8 04/06/2019 0320   CREATININE 0.94 04/06/2019 0320   CREATININE 1.03 11/14/2015 0758   CALCIUM 8.6 (L) 04/06/2019 0320   CALCIUM 9.1 11/14/2015 0758   GFRNONAA >60 04/06/2019 0320   GFRAA >60 04/06/2019 0320    INR No results found for: INR   Intake/Output Summary (Last 24 hours) at 04/07/2019 1143 Last data filed at 04/07/2019 0600 Gross per 24 hour  Intake 2043.6 ml  Output 2800 ml  Net -756.4 ml     Assessment:  64 y.o. male is here with gangrenous changes right great toe  Plan: Right fem-pop bypass with vein and at least great toe amputation with possible partial foot amputation.   Shelah Heatley C. Randie Heinz, MD [facility] [place] Office:  [phone] Pager: [phone]  04/07/2019 11:43 AM

## 2019-04-07 NOTE — Op Note (Signed)
Patient name: Juan Turner MRN: [id number] DOB: Dec 08, 1955 Sex: male  04/07/2019 Pre-operative Diagnosis: Critical right lower extremity ischemia with gangrenous right first toe Post-operative diagnosis:  Same with gangrene of base of second toe as well Surgeon:  Eda Paschal. Donzetta Matters, MD Assistant: Leontine Locket, PA Procedure Performed: 1.  Harvest right greater saphenous vein 2.  Right SFA to posterior tibial artery bypass with reversed translocated ipsilateral greater saphenous vein 3.  Transmetatarsal amputation right first and second toes  Indications: 64 year old male with obvious gangrene of right first toe with rubor of his right foot angiography demonstrates occlusion of the popliteal artery.  Dominant runoff is via the posterior tibial.  He is indicated for bypass from the distal SFA to the posterior tibial artery.  Findings: SFA was pulsatile up to the adductor where it was occluded.  The artery was heavily diseased distal to this.  The vein was marginal but was able to be dilated.  Best section was from the calf the dissection that was harvested was sclerotic.  Posterior tibial artery in the mid calf was soft.  After bypass patient a palpable posterior tibial artery pulse.  Amputation required mid metatarsal of the first and second toes to get to healthy tissue and the tissue was reapproximated.   Procedure:  The patient was identified in the holding area and taken to the operating room where is placed supine operative table general anesthesia induced.  He was sterilely prepped and draped in the right lower extremity usual fashion antibiotics were administered timeout was called.  We began with popliteal exposure incision below the right knee.  We dissected down to the vein.  I made a second incision distal to this to dissect out the vein.  Proximally I then made an above-knee popliteal incision identified the vein near dissected out dividing branches tween clips and ties.  I made a  fourth incision higher to resect the vein.  The vein was doubly clipped on both ends and removed.  It was taken the back table where it was prepared.  Through the above-knee incision I then dissected out the popliteal artery where there was pulsatile placed a vessel loop around it.  In the below-knee popliteal incision I dissected down to the popliteal vein and artery.  I dissected further down divided one of the paired posterior tibial veins.  I dissected till the posterior tibial artery was identified and was quite soft.  I then created a tunnel between the above and below-knee popliteal incisions.  Patient was fully heparinized.  I brought the vein back to the table.  I reversed it and spatulated it.  Initially was quite unhealthy I had to transect approximately 5 cm.  I did find one area that was healthy.  I clamped above-knee popliteal artery proximally distally opened longitudinally.  I then sewed the vein end-to-side with 6-0 Prolene suture.  Upon completion of then released my clamps.  I flushed through my vein graft.  I clamped distally and marked it for orientation.  I tunneled it anatomically.  I then clamped my PT artery distally proximally opened longitudinally.  The leg was straightened and the vein was trimmed to size.  We sewed the vein inside with 6-0 Prolene suture.  Prior to completion this time we allowed flushing all direction.  Upon completion we had good flow distally.  We had good signal at the PT at the ankle.  Satisfied we irrigated all the wounds.  50 mg of protamine was administered and  hemostasis obtained.  All wounds were closed with Vicryl and Monocryl and Dermabond placed to level skin.  I then turned my attention of the right foot.  I initially made a tennis racquet type incision around the first toe.  As I was dissecting around the first toe at the base the second toe was obviously necrotic.  I extended this incision around the second toe.  I dissected back onto the metatarsal bones  and raise the periosteum of both.  I then transected the bones in the mid metatarsal area.  Both toes were removed.  I smoothed the bones with rasp.  I irrigated the wound obtain hemostasis and closed the skin with interrupted 3-0 nylon suture.  A sterile dressing was placed.  The patient was then away from anesthesia having tolerated procedure well without immediate complication.  All counts were correct at completion.  EBL: 400 cc   [name], MD [facility] [place] Office: [phone] Pager: [phone]

## 2019-04-07 NOTE — Progress Notes (Signed)
Plan of care reviewed. Pt is progressing. Vital signs remain stable. No acute distress note. NPO after midnight,  informed consent and blood consent signed. CHG bath given. Pre-op PCR screening was done per protocol, negative for MRSA and Staph. Will continue to monitor.  Filiberto Pinks, RN

## 2019-04-07 NOTE — Transfer of Care (Signed)
Immediate Anesthesia Transfer of Care Note  Patient: Juan Turner  Procedure(s) Performed: LEFT FEMORAL POSTERIOR TIBIAL BYPASS (Right Leg Lower) AMPUTATION DIGIT RIGHT FIRST AND SECOND TOES (Right Foot)  Patient Location: PACU  Anesthesia Type:General  Level of Consciousness: awake  Airway & Oxygen Therapy: Patient Spontanous Breathing and Patient connected to face mask oxygen  Post-op Assessment: Report given to RN  Post vital signs: Reviewed and stable  Last Vitals:  Vitals Value Taken Time  BP    Temp    Pulse 92 04/07/19 1531  Resp 22 04/07/19 1532  SpO2 100 % 04/07/19 1531  Vitals shown include unvalidated device data.  Last Pain:  Vitals:   04/07/19 0748  TempSrc: Oral  PainSc:          Complications: No apparent anesthesia complications

## 2019-04-07 NOTE — Progress Notes (Addendum)
PROGRESS NOTE    Juan Turner  YHC:[id number]  DOB: [birth year]  PCP: Patient, No Pcp Per Admit date:04/04/2019  64 y.o.male,with past medical history of diabetes mellitus, hypertension, peripheral vascular disease, tobacco use, presents to ED c/o worsening right great toe discoloration and pain. Patient followed by podiatry Dr. Germaine Pomfret, with poor outpatient follow-up.He originally presented to podiatry clinic for a wound on the left second toe which has been improving -He was referred to vascular studies but could not complete the study or f/u Vascular surgery due to insurance issues.Patient recently had what appeared to be an injury to his right hallux toenail and when he presented to the office there was drainage of pus underneath the toenail. Dr Denton Brick debrided the loose toenail at that time and the wound was already present.  He was started on antibiotics. 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    ED Course: Afebrile with mildly elevated blood pressure, there was no leukocytosis, patient was started on antibiotics. Hospital course: Patient admitted to [facility] service with broad-spectrum Vanco and Zosyn,  underwent angio 2/23 morning by Dr Darrick Penna and found to have bilateral popliteal artery occlusion, bilateral one-vessel posterior tibial artery runoff. Dr. Darrick Penna recommended bypass surgery and right great toe amputation on 04/07/2019 by Dr. Randie Heinz.Started on aspirin, Crestor daily  Subjective:  Patient back from surgery.  Tolerated well.  Underwent transmetatarsal amputation of right first and second toes as well as femoropopliteal bypass.  Objective: Vitals:   04/07/19 1600 04/07/19 1615 04/07/19 1621 04/07/19 1642  BP: 135/66 114/77  (!) 144/76  Pulse: 81 84  88  Resp: 14 20 15 16   Temp:  99.2 F (37.3 C)  (!) 97.4 F (36.3 C)  TempSrc:    Oral  SpO2:  100%  100%  Weight:      Height:        Intake/Output Summary (Last 24 hours) at  04/07/2019 1734 Last data filed at 04/07/2019 1525 Gross per 24 hour  Intake 2777.04 ml  Output 3600 ml  Net -822.96 ml   Filed Weights   04/04/19 0915  Weight: 69.4 kg    Physical Examination:  General exam: Appears calm and comfortable  Respiratory system: Clear to auscultation. Respiratory effort normal. Cardiovascular system: S1 & S2 heard, RRR. No JVD, murmurs, rubs, gallops or clicks. No pedal edema. Gastrointestinal system: Abdomen is nondistended, soft and nontender. Normal bowel sounds heard. Central nervous system: Alert and oriented. No new focal neurological deficits. Extremities: No contractures, edema or joint deformities.  Skin: No rashes, lesions or ulcers Psychiatry: Judgement and insight appear normal. Mood & affect appropriate.   Data Reviewed: I have personally reviewed following labs and imaging studies  CBC: Recent Labs  Lab 04/04/19 0954 04/04/19 1901 04/05/19 0300 04/06/19 0320  WBC 7.5 8.9 7.6 6.6  NEUTROABS 5.2  --   --  4.4  HGB 13.6 13.5 12.4* 11.8*  HCT 42.3 40.6 37.1* 35.3*  MCV 85.8 83.5 82.8 82.1  PLT 247 288 271 237   Basic Metabolic Panel: Recent Labs  Lab 04/04/19 0954 04/04/19 1901 04/05/19 0300 04/06/19 0320  NA 132*  --  134* 133*  K 4.3  --  3.6 3.8  CL 96*  --  98 98  CO2 25  --  22 23  GLUCOSE 321*  --  160* 155*  BUN 13  --  10 8  CREATININE 1.04 0.90 0.85 0.94  CALCIUM 9.4  --  9.0 8.6*   GFR: Estimated Creatinine Clearance: 76.9 mL/min (by C-G formula based on SCr of 0.94 mg/dL). Liver Function Tests: No results for input(s): AST, ALT, ALKPHOS, BILITOT, PROT, ALBUMIN in the last 168 hours. No results for input(s): LIPASE, AMYLASE in the last 168 hours. No results for input(s): AMMONIA in the last 168 hours. Coagulation Profile: No results for input(s): INR, PROTIME in the last 168 hours. Cardiac Enzymes: No results for input(s): CKTOTAL, CKMB, CKMBINDEX, TROPONINI in the last 168 hours. BNP (last 3 results) No  results for input(s): PROBNP in the last 8760 hours. HbA1C: No results for input(s): HGBA1C in the last 72 hours. CBG: Recent Labs  Lab 04/06/19 2126 04/07/19 0631 04/07/19 1102 04/07/19 1109 04/07/19 1532  GLUCAP 109* 132* 134* 128* 107*   Lipid Profile: No results for input(s): CHOL, HDL, LDLCALC, TRIG, CHOLHDL, LDLDIRECT in the last 72 hours. Thyroid Function Tests: No results for input(s): TSH, T4TOTAL, FREET4, T3FREE, THYROIDAB in the last 72 hours. Anemia Panel: No results for input(s): VITAMINB12, FOLATE, FERRITIN, TIBC, IRON, RETICCTPCT in the last 72 hours. Sepsis Labs: No results for input(s): PROCALCITON, LATICACIDVEN in the last 168 hours.  Recent Results (from the past 240 hour(s))  SARS CORONAVIRUS 2 (TAT 6-24 HRS) Nasopharyngeal Nasopharyngeal Swab     Status: None   Collection Time: 04/04/19  4:10 PM   Specimen: Nasopharyngeal Swab  Result Value Ref Range Status   SARS Coronavirus 2 NEGATIVE NEGATIVE Final    Comment: (NOTE) SARS-CoV-2 target nucleic acids are NOT DETECTED. The SARS-CoV-2 RNA is generally detectable in upper and lower respiratory specimens during the acute phase of infection. Negative results do not preclude SARS-CoV-2 infection, do not rule out co-infections with other pathogens, and should not be used as the sole basis for treatment or other patient management decisions. Negative results must be combined with clinical observations, patient history, and epidemiological information. The expected result is Negative. Fact Sheet for Patients: HairSlick.no Fact Sheet for Healthcare Providers: quierodirigir.com This test is not yet approved or cleared by the [place] FDA and  has been authorized for detection and/or diagnosis of SARS-CoV-2 by FDA under an Emergency Use Authorization (EUA). This EUA will remain  in effect (meaning this test can be used) for the duration of the COVID-19  declaration under Section 56 4(b)(1) of the Act, 21 U.S.C. section 360bbb-3(b)(1), unless the authorization is terminated or revoked sooner. Performed at [facility], [address], [place], [place] [address]   Surgical pcr screen     Status: None   Collection Time: 04/06/19  7:54 PM   Specimen: Nasal Mucosa; Nasal Swab  Result Value Ref Range Status   MRSA, PCR NEGATIVE NEGATIVE Final   Staphylococcus aureus NEGATIVE NEGATIVE Final    Comment: (NOTE) The Xpert SA Assay (FDA approved for NASAL specimens in patients 71 years of age and older), is one component of a comprehensive surveillance program. It is not intended to diagnose infection nor to guide or monitor treatment. Performed at [facility], [address] [place], [place] [address]       Radiology Studies: VAS [place] ABI WITH/WO TBI  Result Date: 04/06/2019 LOWER EXTREMITY DOPPLER STUDY Indications: Gangrene, and known bilateral occlusions.  Comparison Study: Aortogram 04-05-19. Performing Technologist: Kennedy Bucker RVT, RDMS  Examination Guidelines: A complete evaluation includes at minimum, Doppler waveform signals and systolic blood pressure reading at the level of bilateral brachial, anterior tibial, and posterior tibial arteries, when vessel segments are accessible.  Bilateral testing is considered an integral part of a complete examination. Photoelectric Plethysmograph (PPG) waveforms and toe systolic pressure readings are included as required and additional duplex testing as needed. Limited examinations for reoccurring indications may be performed as noted.  ABI Findings: +---------+------------------+-----+----------+--------------------------+ Right    Rt Pressure (mmHg)IndexWaveform  Comment                    +---------+------------------+-----+----------+--------------------------+ Brachial 160                    triphasic                             +---------+------------------+-----+----------+--------------------------+ PTA      65                0.40 monophasic                           +---------+------------------+-----+----------+--------------------------+ DP                              absent                               +---------+------------------+-----+----------+--------------------------+ Great Toe                                 not done due to open wound +---------+------------------+-----+----------+--------------------------+ +---------+------------------+-----+---------+-------+ Left     Lt Pressure (mmHg)IndexWaveform Comment +---------+------------------+-----+---------+-------+ Brachial 162                    triphasic        +---------+------------------+-----+---------+-------+ PTA                             absent           +---------+------------------+-----+---------+-------+ DP                              absent           +---------+------------------+-----+---------+-------+ Great Toe                       Absent           +---------+------------------+-----+---------+-------+ +-------+-----------+-----------+------------+------------+ ABI/TBIToday's ABIToday's TBIPrevious ABIPrevious TBI +-------+-----------+-----------+------------+------------+ Right  0.4                                            +-------+-----------+-----------+------------+------------+ When innsonating the Left PTA there was a slight pulsatile waveform but obliterated with slight pressure, possibly a vein or a very monophasic PTA.  Summary: Right: Resting right ankle-brachial index indicates severe right lower extremity arterial disease. Left: Resting left ankle-brachial index indicates critical left limb ischemia. When innsonating the Left PTA there was a slight pulsatile waveform but obliterated with slight pressure, possibly a vein or a very monophasic PTA. Results were discussed with Dr.  Donzetta Matters.  *See table(s) above for measurements and observations.  Electronically signed by Harold Barban MD on 04/06/2019 at 10:30:51 PM.   Final  Scheduled Meds: . amLODipine  5 mg Oral Daily   And  . benazepril  10 mg Oral Daily  . aspirin EC  81 mg Oral Daily  . Chlorhexidine Gluconate Cloth  6 each Topical Daily  . [START ON 04/08/2019] docusate sodium  100 mg Oral Daily  . [START ON 04/08/2019] heparin  5,000 Units Subcutaneous Q8H  . hydrocerin   Topical BID  . insulin aspart  0-15 Units Subcutaneous TID WC  . insulin aspart  0-5 Units Subcutaneous QHS  . nicotine  21 mg Transdermal Daily  . pantoprazole  40 mg Oral Daily  . rosuvastatin  10 mg Oral q1800  . sodium chloride flush  3 mL Intravenous Q12H   Continuous Infusions: . sodium chloride Stopped (04/07/19 1515)  . sodium chloride    . magnesium sulfate bolus IVPB    . piperacillin-tazobactam (ZOSYN)  IV 3.375 g (04/07/19 0926)  . vancomycin 1,000 mg (04/07/19 0916)    Assessment & Plan:   Assessment/Plan:  Right great toe, left second toe gangrene: PVD on abdominal aortogram with bilateral popliteal artery occlusion,bilateral one-vessel posterior tibial artery runoff . Currently afebrile, no leukocytosis. Vascular surgery on board,  underwent right SFA to posterior tibial artery bypass with harvested right greater saphenous vein, or transmetatarsal amputation of right first and second toes by Dr. Randie Heinz.Continue Zosyn for now and DC vancomycin.  Started on aspirin, Crestor daily in this admission.  Diabetes mellitus type 2, Uncontrolled: HgbA1c 9.6 on 04/04/2019. SSI for now, Accu-Cheks, will resume oral hypoglycemics from a.m. as cleared for diet tonight.  Hypertension:Continue amlodipine, benazepril  Mild hyponatremia: could be pseudohyponatremia in the setting of hyperglycemia or pain related SIADH. Monitor on IV fluids, labs in a.m.  Tobacco abuse: Advised to quit as contributing to PVD, continue nicotine  patch   DVT prophylaxis: heparin Code Status: Full code Family / Patient Communication: d/w patient and all questions answered to satisfaction. Disposition Plan:   Patient is from home prior to hospitalization. Received/Receiving inpatient care for PVD /gangrene needing bypass surgery/toe amputation Discharge when off abx, cleared by surgery and diabetes regimen determined.      LOS: 3 days    Time spent: 35 minutes    Alessandra Bevels, MD [facility] Pager in [place]  If 7PM-7AM, please contact night-coverage [email or web address] 04/07/2019, 5:34 PM

## 2019-04-07 NOTE — Discharge Instructions (Signed)
 [facility]  Discharge instructions  Lower Extremity Bypass Surgery  Please refer to the following instruction for your post-procedure care. Your surgeon or physician assistant will discuss any changes with you.  Activity  You are encouraged to walk as much as you can. You can slowly return to normal activities during the month after your surgery. Avoid strenuous activity and heavy lifting until your doctor tells you it's OK. Avoid activities such as vacuuming or swinging a golf club. Do not drive until your doctor give the OK and you are no longer taking prescription pain medications. It is also normal to have difficulty with sleep habits, eating and bowel movement after surgery. These will go away with time.  Bathing/Showering  Shower daily after you go home. Do not soak in a bathtub, hot tub, or swim until the incision heals completely.  Incision Care  Clean your incision with mild soap and water. Shower every day. Pat the area dry with a clean towel. You do not need a bandage unless otherwise instructed. Do not apply any ointments or creams to your incision. If you have open wounds you will be instructed how to care for them or a visiting nurse may be arranged for you. If you have staples or sutures along your incision they will be removed at your post-op appointment. You may have skin glue on your incision. Do not peel it off. It will come off on its own in about one week.  Wash the groin wound with soap and water daily and pat dry. (No tub bath-only shower)  Then put a dry gauze or washcloth in the groin to keep this area dry to help prevent wound infection.  Do this daily and as needed.  Do not use Vaseline or neosporin on your incisions.  Only use soap and water on your incisions and then protect and keep dry.  Diet  Resume your normal diet. There are no special food restrictions following this procedure. A low fat/ low cholesterol diet is  recommended for all patients with vascular disease. In order to heal from your surgery, it is CRITICAL to get adequate nutrition. Your body requires vitamins, minerals, and protein. Vegetables are the best source of vitamins and minerals. Vegetables also provide the perfect balance of protein. Processed food has little nutritional value, so try to avoid this.  Medications  Resume taking all your medications unless your doctor or physician assistant tells you not to. If your incision is causing pain, you may take over-the-counter pain relievers such as acetaminophen (Tylenol). If you were prescribed a stronger pain medication, please aware these medication can cause nausea and constipation. Prevent nausea by taking the medication with a snack or meal. Avoid constipation by drinking plenty of fluids and eating foods with high amount of fiber, such as fruits, vegetables, and grains. Take Colace 100 mg (an over-the-counter stool softener) twice a day as needed for constipation.  Do not take Tylenol if you are taking prescription pain medications.  Follow Up  Our office will schedule a follow up appointment 2-3 weeks following discharge.  Please call us immediately for any of the following conditions  Severe or worsening pain in your legs or feet while at rest or while walking Increase pain, redness, warmth, or drainage (pus) from your incision site(s) Fever of 101 degree or higher The swelling in your leg with the bypass suddenly worsens and becomes more painful than when you were in the hospital If you have   been instructed to feel your graft pulse then you should do so every day. If you can no longer feel this pulse, call the office immediately. Not all patients are given this instruction.  Leg swelling is common after leg bypass surgery.  The swelling should improve over a few months following surgery. To improve the swelling, you may elevate your legs above the level of your heart while you are  sitting or resting. Your surgeon or physician assistant may ask you to apply an ACE wrap or wear compression (TED) stockings to help to reduce swelling.  Reduce your risk of vascular disease  Stop smoking. If you would like help call [facility] at 1-800-QUIT-NOW (1-800-784-8669) or [place] at [phone].  Manage your cholesterol Maintain a desired weight Control your diabetes weight Control your diabetes Keep your blood pressure down  If you have any questions, please call the office at [phone]  

## 2019-04-08 ENCOUNTER — Encounter (HOSPITAL_COMMUNITY): Payer: Medicaid Other

## 2019-04-08 DIAGNOSIS — E1152 Type 2 diabetes mellitus with diabetic peripheral angiopathy with gangrene: Secondary | ICD-10-CM | POA: Diagnosis not present

## 2019-04-08 DIAGNOSIS — L97509 Non-pressure chronic ulcer of other part of unspecified foot with unspecified severity: Secondary | ICD-10-CM | POA: Diagnosis not present

## 2019-04-08 DIAGNOSIS — I96 Gangrene, not elsewhere classified: Secondary | ICD-10-CM | POA: Diagnosis not present

## 2019-04-08 DIAGNOSIS — E11621 Type 2 diabetes mellitus with foot ulcer: Secondary | ICD-10-CM | POA: Diagnosis not present

## 2019-04-08 DIAGNOSIS — I10 Essential (primary) hypertension: Secondary | ICD-10-CM | POA: Diagnosis not present

## 2019-04-08 LAB — BASIC METABOLIC PANEL
Anion gap: 8 (ref 5–15)
BUN: 6 mg/dL — ABNORMAL LOW (ref 8–23)
CO2: 25 mmol/L (ref 22–32)
Calcium: 8.2 mg/dL — ABNORMAL LOW (ref 8.9–10.3)
Chloride: 97 mmol/L — ABNORMAL LOW (ref 98–111)
Creatinine, Ser: 0.88 mg/dL (ref 0.61–1.24)
GFR calc Af Amer: >60 mL/min (ref >60)
GFR calc non Af Amer: >60 mL/min (ref >60)
Glucose, Bld: 210 mg/dL — ABNORMAL HIGH (ref 70–99)
Potassium: 3.8 mmol/L (ref 3.5–5.1)
Sodium: 130 mmol/L — ABNORMAL LOW (ref 135–145)

## 2019-04-08 LAB — CBC
HCT: 29.2 % — ABNORMAL LOW (ref 39.0–52.0)
Hemoglobin: 9.6 g/dL — ABNORMAL LOW (ref 13.0–17.0)
MCH: 27.3 pg (ref 26.0–34.0)
MCHC: 32.9 g/dL (ref 30.0–36.0)
MCV: 83 fL (ref 80.0–100.0)
Platelets: 219 10*3/uL (ref 150–400)
RBC: 3.52 MIL/uL — ABNORMAL LOW (ref 4.22–5.81)
RDW: 11.5 % (ref 11.5–15.5)
WBC: 7.2 10*3/uL (ref 4.0–10.5)
nRBC: 0 % (ref 0.0–0.2)

## 2019-04-08 LAB — GLUCOSE, CAPILLARY
Glucose-Capillary: 198 mg/dL — ABNORMAL HIGH (ref 70–99)
Glucose-Capillary: 187 mg/dL — ABNORMAL HIGH (ref 70–99)
Glucose-Capillary: 124 mg/dL — ABNORMAL HIGH (ref 70–99)
Glucose-Capillary: 250 mg/dL — ABNORMAL HIGH (ref 70–99)

## 2019-04-08 MED ORDER — GLIPIZIDE 5 MG PO TABS
2.5000 mg | ORAL_TABLET | Freq: Two times a day (BID) | ORAL | Status: DC
  Administered 2019-04-09 (×2): 2.5 mg via ORAL
  Filled 2019-04-08 (×2): qty 1

## 2019-04-08 NOTE — Progress Notes (Signed)
PROGRESS NOTE    Juan Turner  DDU:[id number]  DOB: 10-19-1955  PCP: Patient, No Pcp Per Admit date:04/04/2019  64 y.o.male,with past medical history of diabetes mellitus, hypertension, peripheral vascular disease, tobacco use, presents to ED c/o worsening right great toe discoloration and pain. Patient followed by podiatry Dr. Germaine Pomfret, with poor outpatient follow-up.He originally presented to podiatry clinic for a wound on the left second toe which has been improving -He was referred to vascular studies but could not complete the study or f/u Vascular surgery due to insurance issues.Patient recently had what appeared to be an injury to his right hallux toenail and when he presented to the office there was drainage of pus underneath the toenail. Dr Denton Brick debrided the loose toenail at that time and the wound was already present.  He was started on antibiotics. He now presented to [facility] ED and found to have gangrene of his right hallux.    ED Course: Afebrile with mildly elevated blood pressure, there was no leukocytosis, patient was started on antibiotics. Hospital course: Patient admitted to [facility] service with broad-spectrum Vanco and Zosyn,  underwent angio 2/23 morning by Dr Darrick Penna and found to have bilateral popliteal artery occlusion, bilateral one-vessel posterior tibial artery runoff. Dr. Darrick Penna recommended bypass surgery and right great toe amputation on 04/07/2019 by Dr. Randie Heinz.Started on aspirin, Crestor daily  Subjective:  Patient resting comfortably, postop day 1 for transmetatarsal amputation of right first and second toes as well as femoropopliteal bypass.  Seen by podiatry earlier today.  Wife at bedside.  Worked somewhat with PT  Objective: Vitals:   04/08/19 0836 04/08/19 1113 04/08/19 1310 04/08/19 1643  BP: 133/71  128/73 (!) 164/67  Pulse:   78 74  Resp:  (!) 24 16 19   Temp:   99.3 F (37.4 C) 98.9 F (37.2 C)  TempSrc:   Oral Oral  SpO2:    99%  Weight:        Height:        Intake/Output Summary (Last 24 hours) at 04/08/2019 1746 Last data filed at 04/08/2019 1620 Gross per 24 hour  Intake 790 ml  Output 1575 ml  Net -785 ml   Filed Weights   04/04/19 0915  Weight: 69.4 kg    Physical Examination:  General exam: Appears calm and comfortable  Respiratory system: Clear to auscultation. Respiratory effort normal. Cardiovascular system: S1 & S2 heard, RRR. No JVD, murmurs, rubs, gallops or clicks. No pedal edema. Gastrointestinal system: Abdomen is nondistended, soft and nontender. Normal bowel sounds heard. Central nervous system: Alert and oriented. No new focal neurological deficits. Extremities: Dressing at postop right foot site with Ace wrap, left second toe with mild discoloration/hyperkeratotic lesion Skin: No rashes, lesions or ulcers Psychiatry: Judgement and insight appear normal. Mood & affect appropriate.   Data Reviewed: I have personally reviewed following labs and imaging studies  CBC: Recent Labs  Lab 04/04/19 0954 04/04/19 1901 04/05/19 0300 04/06/19 0320 04/08/19 0253  WBC 7.5 8.9 7.6 6.6 7.2  NEUTROABS 5.2  --   --  4.4  --   HGB 13.6 13.5 12.4* 11.8* 9.6*  HCT 42.3 40.6 37.1* 35.3* 29.2*  MCV 85.8 83.5 82.8 82.1 83.0  PLT 247 288 271 237 219   Basic Metabolic Panel: Recent Labs  Lab 04/04/19 0954 04/04/19 1901 04/05/19 0300 04/06/19 0320 04/08/19 0253  NA 132*  --  134* 133* 130*  K 4.3  --  3.6 3.8 3.8  CL 96*  --  98 98 97*  CO2 25  --  22 23 25   GLUCOSE 321*  --  160* 155* 210*  BUN 13  --  10 8 6*  CREATININE 1.04 0.90 0.85 0.94 0.88  CALCIUM 9.4  --  9.0 8.6* 8.2*   GFR: Estimated Creatinine Clearance: 82.1 mL/min (by C-G formula based on SCr of 0.88 mg/dL). Liver Function Tests: No results for input(s): AST, ALT, ALKPHOS, BILITOT, PROT, ALBUMIN in the last 168 hours. No results for input(s): LIPASE, AMYLASE in the last 168 hours. No results for input(s): AMMONIA in the last 168  hours. Coagulation Profile: No results for input(s): INR, PROTIME in the last 168 hours. Cardiac Enzymes: No results for input(s): CKTOTAL, CKMB, CKMBINDEX, TROPONINI in the last 168 hours. BNP (last 3 results) No results for input(s): PROBNP in the last 8760 hours. HbA1C: No results for input(s): HGBA1C in the last 72 hours. CBG: Recent Labs  Lab 04/07/19 1647 04/07/19 2146 04/08/19 0612 04/08/19 1312 04/08/19 1641  GLUCAP 228* 204* 198* 187* 124*   Lipid Profile: No results for input(s): CHOL, HDL, LDLCALC, TRIG, CHOLHDL, LDLDIRECT in the last 72 hours. Thyroid Function Tests: No results for input(s): TSH, T4TOTAL, FREET4, T3FREE, THYROIDAB in the last 72 hours. Anemia Panel: No results for input(s): VITAMINB12, FOLATE, FERRITIN, TIBC, IRON, RETICCTPCT in the last 72 hours. Sepsis Labs: No results for input(s): PROCALCITON, LATICACIDVEN in the last 168 hours.  Recent Results (from the past 240 hour(s))  SARS CORONAVIRUS 2 (TAT 6-24 HRS) Nasopharyngeal Nasopharyngeal Swab     Status: None   Collection Time: 04/04/19  4:10 PM   Specimen: Nasopharyngeal Swab  Result Value Ref Range Status   SARS Coronavirus 2 NEGATIVE NEGATIVE Final    Comment: (NOTE) SARS-CoV-2 target nucleic acids are NOT DETECTED. The SARS-CoV-2 RNA is generally detectable in upper and lower respiratory specimens during the acute phase of infection. Negative results do not preclude SARS-CoV-2 infection, do not rule out co-infections with other pathogens, and should not be used as the sole basis for treatment or other patient management decisions. Negative results must be combined with clinical observations, patient history, and epidemiological information. The expected result is Negative. Fact Sheet for Patients: SugarRoll.be Fact Sheet for Healthcare Providers: https://www.woods-mathews.com/ This test is not yet approved or cleared by the [place] FDA and   has been authorized for detection and/or diagnosis of SARS-CoV-2 by FDA under an Emergency Use Authorization (EUA). This EUA will remain  in effect (meaning this test can be used) for the duration of the COVID-19 declaration under Section 56 4(b)(1) of the Act, 21 U.S.C. section 360bbb-3(b)(1), unless the authorization is terminated or revoked sooner. Performed at [facility], [place] [address], [place], [place] [address]   Surgical pcr screen     Status: None   Collection Time: 04/06/19  7:54 PM   Specimen: Nasal Mucosa; Nasal Swab  Result Value Ref Range Status   MRSA, PCR NEGATIVE NEGATIVE Final   Staphylococcus aureus NEGATIVE NEGATIVE Final    Comment: (NOTE) The Xpert SA Assay (FDA approved for NASAL specimens in patients 28 years of age and older), is one component of a comprehensive surveillance program. It is not intended to diagnose infection nor to guide or monitor treatment. Performed at [facility], [place] [address], [place], [place] [address]       Radiology Studies: No results found.      Scheduled Meds: . amLODipine  5 mg Oral Daily   And  .  benazepril  10 mg Oral Daily  . aspirin EC  81 mg Oral Daily  . Chlorhexidine Gluconate Cloth  6 each Topical Daily  . docusate sodium  100 mg Oral Daily  . glipiZIDE  2.5 mg Oral QAC breakfast  . heparin  5,000 Units Subcutaneous Q8H  . hydrocerin   Topical BID  . insulin aspart  0-15 Units Subcutaneous TID WC  . insulin aspart  0-5 Units Subcutaneous QHS  . nicotine  21 mg Transdermal Daily  . pantoprazole  40 mg Oral Daily  . rosuvastatin  10 mg Oral q1800  . sodium chloride flush  3 mL Intravenous Q12H   Continuous Infusions: . sodium chloride 75 mL/hr at 04/07/19 1856  . sodium chloride    . magnesium sulfate bolus IVPB    . piperacillin-tazobactam (ZOSYN)  IV 3.375 g (04/08/19 1724)    Assessment & Plan:   Assessment/Plan:  Right great toe, left second toe gangrene: PVD on abdominal  aortogram with bilateral popliteal artery occlusion,bilateral one-vessel posterior tibial artery runoff . Currently afebrile, no leukocytosis. Vascular surgery on board,  underwent right SFA to posterior tibial artery bypass with harvested right greater saphenous vein, or transmetatarsal amputation of right first and second toes by Dr. Randie Heinz.Continue Zosyn for now given left foot issues, off vancomycin.  Seen by podiatry today.  Appreciate Dr. Ardelle Anton following up.  Started on aspirin, Crestor daily in this admission.  Diabetes mellitus type 2, Uncontrolled: HgbA1c 9.6 on 04/04/2019. SSI for now, Accu-Cheks,  resumed oral hypoglycemics  as cleared for diet now  Hypertension:Continue amlodipine, benazepril  Mild hyponatremia: Combination of pseudohyponatremia in the setting of hyperglycemia and pain related?  SIADH.  Worsening with IV fluids-unlikely to be related to dehydration.  Will DC IV fluids and monitor.  Tobacco abuse: Advised to quit as contributing to PVD, continue nicotine patch   DVT prophylaxis: heparin Code Status: Full code Family / Patient Communication: d/w patient and all questions answered to satisfaction. Disposition Plan:   Patient is from home prior to hospitalization. Received/Receiving inpatient care for PVD /gangrene needing bypass surgery/toe amputation Discharge when off abx, cleared by surgery/podiatry and PT.  May need rehab.      LOS: 4 days    Time spent: 35 minutes    Alessandra Bevels, MD [facility] Pager in [place]  If 7PM-7AM, please contact night-coverage [email or web address] 04/08/2019, 5:46 PM

## 2019-04-08 NOTE — Progress Notes (Signed)
Subjective: Underwent bypass in the right lower extremity today.  He is doing well with any pain.  There is no new concerns.Denies any systemic complaints such as fevers, chills, nausea, vomiting.  Objective: AAO x3, NAD Bandage intact on the right foot.  At the distal aspect the left second toe is a hyperkeratotic lesion with a small eschar.  There is no drainage or pus.  No fluctuation or crepitation there is no malodor.  No pain.  Mild swelling to the toe. No pain with calf compression, swelling, warmth, erythema  Assessment: POD #1 s/p right SFA to posterior tibial bypass and transmetatarsal amputation of the 1st and 2nd metatarsal with vascular; left 2nd toe gangrene/PAD  Plan: Continue antibiotics for now.  We will continue to monitor the left second toe. Continue current care by vascular surgery. Please let me know if I can be of any assistance.   Ovid Curd, DPM O: [phone] C: [phone]

## 2019-04-08 NOTE — Evaluation (Signed)
Physical Therapy Evaluation Patient Details Name: Juan Turner MRN: [id number] DOB: 1955-09-13 Today's Date: 04/08/2019   History of Present Illness  64 y.o. male, with past medical history of diabetes mellitus, hypertension, peripheral vascular disease, tobacco use, presents to ED c/o worsening right great toe discoloration and pain. 04-07-19 right SFA to posterior tibial bypass with reversed translocated ipsilateral greater saphenous vein and transmetatarsal amputation right first and 2nd toes    Clinical Impression  Pt walked 40 feet with RW and DARCO shoe.  Pt weak and legs and back - flexed posture.  Pts HR 80bpm at rest and up to 130bpm walking.  Pt should do well but progress slowly. He reports having assist at home at [place].  Pt will benefit from skilled PT services in hospital and [facility] at [place].    Follow Up Recommendations Home health PT;Supervision for mobility/OOB    Equipment Recommendations  Rolling walker with 5" wheels;3in1 (PT)    Recommendations for Other Services       Precautions / Restrictions Precautions Precautions: Fall Precaution Comments: DARCO shoe when up Restrictions Weight Bearing Restrictions: No RLE Weight Bearing: Weight bearing as tolerated      Mobility  Bed Mobility Overal bed mobility: Needs Assistance Bed Mobility: Supine to Sit     Supine to sit: Supervision     General bed mobility comments: pt needed cues and extra time to sit EOB  Transfers Overall transfer level: Needs assistance Equipment used: Rolling walker (2 wheeled) Transfers: Sit to/from Stand Sit to Stand: Min assist;From elevated surface            Ambulation/Gait Ambulation/Gait assistance: Min Web designer (Feet): 40 Feet Assistive device: Rolling walker (2 wheeled) Gait Pattern/deviations: Step-to pattern;Trunk flexed;Antalgic;Decreased stance time - right     General Gait Details: Pt walked slow walk with flexed posture and cues for sequencing.  Pt  fatiqued and got more flexed in his back.  Stairs            Wheelchair Mobility    Modified Rankin (Stroke Patients Only)       Balance Overall balance assessment: Needs assistance             Standing balance comment: not assessed but relies heavily on RW for support                             Pertinent Vitals/Pain Pain Assessment: 0-10 Pain Score: 6  Pain Location: right calf and ankle Pain Descriptors / Indicators: Aching;Burning;Discomfort;Nagging;Throbbing Pain Intervention(s): Monitored during session;Repositioned    Home Living Family/patient expects to be discharged to:: Private residence Living Arrangements: Spouse/significant other Available Help at Discharge: Family Type of Home: House Home Access: Stairs to enter Entrance Stairs-Rails: Can reach both Entrance Stairs-Number of Steps: 4 Home Layout: One level Home Equipment: Cane - single point      Prior Function Level of Independence: Independent         Comments: Pt reports he was walking with cane and denies recent falls.  pt said he was spending much of the time in the bed as his leg hurt.     Hand Dominance        Extremity/Trunk Assessment   Upper Extremity Assessment Upper Extremity Assessment: Defer to OT evaluation    Lower Extremity Assessment Lower Extremity Assessment: Generalized weakness    Cervical / Trunk Assessment Cervical / Trunk Assessment: Kyphotic  Communication   Communication: No difficulties(but hard  to understand sometimes)  Cognition Arousal/Alertness: Awake/alert Behavior During Therapy: WFL for tasks assessed/performed Overall Cognitive Status: Within Functional Limits for tasks assessed                                 General Comments: Pt cooperative - wanting to do things by himself      General Comments General comments (skin integrity, edema, etc.): Pt with surgical wrap on right foot - no signs of drainage.  DARCO  shoe on when up.    Exercises Total Joint Exercises Ankle Circles/Pumps: AROM;10 reps;Both;Seated   Assessment/Plan    PT Assessment Patient needs continued PT services  PT Problem List Decreased strength;Decreased mobility;Decreased knowledge of precautions;Decreased activity tolerance;Cardiopulmonary status limiting activity;Decreased skin integrity;Decreased balance;Pain       PT Treatment Interventions DME instruction;Therapeutic activities;Gait training;Therapeutic exercise;Patient/family education;Stair training;Functional mobility training    PT Goals (Current goals can be found in the Care Plan section)  Acute Rehab PT Goals Patient Stated Goal: to get home wtih family help PT Goal Formulation: With patient Time For Goal Achievement: 04/15/19 Potential to Achieve Goals: Good    Frequency Min 3X/week   Barriers to discharge        Co-evaluation               AM-PAC PT "6 Clicks" Mobility  Outcome Measure Help needed turning from your back to your side while in a flat bed without using bedrails?: A Little Help needed moving from lying on your back to sitting on the side of a flat bed without using bedrails?: A Little Help needed moving to and from a bed to a chair (including a wheelchair)?: A Little Help needed standing up from a chair using your arms (e.g., wheelchair or bedside chair)?: A Little Help needed to walk in hospital room?: A Little Help needed climbing 3-5 steps with a railing? : A Lot 6 Click Score: 17    End of Session Equipment Utilized During Treatment: Gait belt Activity Tolerance: Patient limited by fatigue Patient left: in chair;with call bell/phone within reach;with chair alarm set Nurse Communication: Mobility status PT Visit Diagnosis: Unsteadiness on feet (R26.81);Muscle weakness (generalized) (M62.81);Difficulty in walking, not elsewhere classified (R26.2);Pain Pain - Right/Left: Right Pain - part of body: Ankle and joints of foot     Time: 0935-1005 PT Time Calculation (min) (ACUTE ONLY): 30 min   Charges:   PT Evaluation $PT Eval Low Complexity: 1 Low PT Treatments $Gait Training: 8-22 mins        04/08/2019   Ranae Palms, PT   Judson Roch 04/08/2019, 11:07 AM

## 2019-04-08 NOTE — Progress Notes (Addendum)
  Progress Note    04/08/2019 7:41 AM 1 Day Post-Op  Subjective: mild incisional pain otherwise no other complaints. Has not been up to ambulate. Tolerating diet   Vitals:   04/08/19 0027 04/08/19 0347  BP: 135/77 (!) 116/50  Pulse: 77 75  Resp: 19 17  Temp: 98.2 F (36.8 C) 99.1 F (37.3 C)  SpO2: 98% 97%   Physical Exam: General: resting comfortably, well nourished Lungs:  Non labored Incisions:  Right lower extremity incisions clean, dry and intact. No hematoma. No swelling or ecchymosis. Right foot dressed, clean and dry Extremities:  2+ bilateral femoral pulses, right lower extremity well perfused, warm to touch, motor and sensory intact, PT/Pero Doppler signals Abdomen:  Soft, non tender Neurologic: Alert and oriented  CBC    Component Value Date/Time   WBC 7.2 04/08/2019 0253   RBC 3.Juan (L) 04/08/2019 0253   HGB 9.6 (L) 04/08/2019 0253   HCT 29.2 (L) 04/08/2019 0253   HCT 44 (A) 11/14/2015 0758   PLT 219 04/08/2019 0253   MCV 83.0 04/08/2019 0253   MCV 87.8 11/14/2015 0758   MCH 27.3 04/08/2019 0253   MCHC 32.9 04/08/2019 0253   RDW 11.5 04/08/2019 0253   RDW 13.7 11/14/2015 0758   LYMPHSABS 1.5 04/06/2019 0320   MONOABS 0.6 04/06/2019 0320   EOSABS 0.1 04/06/2019 0320   EOSABS 180 11/14/2015 0758   BASOSABS 0.0 04/06/2019 0320    BMET    Component Value Date/Time   NA 130 (L) 04/08/2019 0253   NA 137 11/14/2015 0758   K 3.8 04/08/2019 0253   K 4.1 11/14/2015 0758   CL 97 (L) 04/08/2019 0253   CL 102 11/14/2015 0758   CO2 25 04/08/2019 0253   CO2 26 11/14/2015 0758   GLUCOSE 210 (H) 04/08/2019 0253   BUN 6 (L) 04/08/2019 0253   CREATININE 0.88 04/08/2019 0253   CREATININE 1.03 11/14/2015 0758   CALCIUM 8.2 (L) 04/08/2019 0253   CALCIUM 9.1 11/14/2015 0758   GFRNONAA >60 04/08/2019 0253   GFRAA >60 04/08/2019 0253    INR No results found for: INR   Intake/Output Summary (Last 24 hours) at 04/08/2019 0741 Last data filed at 04/08/2019  0636 Gross per 24 hour  Intake 2490 ml  Output 2325 ml  Net 165 ml     Assessment/Plan:  64 y.o. Turner is s/p right SFA to posterior tibial bypass with reversed translocated ipsilateral greater saphenous vein and transmetatarsal amputation right first and 2nd toes 1 Day Post-Op. Doing well post op. Good perfusion of right lower extremity with brisk PT doppler signal. Will work with PT today. Will take down dressings tomorrow to look at amputation site  DVT prophylaxis:  SQ heparin   Graceann Congress, PA-C [facility] [phone] 04/08/2019 7:41 AM   I have independently interviewed and examined patient and agree with PA assessment and plan above.   Gilberta Peeters C. Randie Heinz, MD [facility] [place] Office: [phone] Pager: [phone]

## 2019-04-08 NOTE — Evaluation (Signed)
Occupational Therapy Evaluation Patient Details Name: Juan Turner MRN: [id number] DOB: 05/12/1955 Today's Date: 04/08/2019    History of Present Illness 64 y.o. male, with past medical history of diabetes mellitus, hypertension, peripheral vascular disease, tobacco use, presents to ED c/o worsening right great toe discoloration and pain. 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 Patient reports family support however his spouse and adult son that live with patient do work during the day. Currently patient is min A for functional transfers with use for safety with body mechanics and appropriate walker use, modA for LB dressing and education how to don darco shoe. Recommend continued acute OT services to maximize patient safety and independence with self care.    Follow Up Recommendations  Home health OT    Equipment Recommendations  Tub/shower seat       Precautions / Restrictions Precautions Precautions: Fall Precaution Comments: DARCO shoe when up Restrictions Weight Bearing Restrictions: No RLE Weight Bearing: Weight bearing as tolerated      Mobility Bed Mobility Overal bed mobility: Needs Assistance Bed Mobility: Supine to Sit     Supine to sit: Supervision     General bed mobility comments: pt needed cues and extra time to sit EOB  Transfers Overall transfer level: Needs assistance Equipment used: Rolling walker (2 wheeled) Transfers: Sit to/from Stand Sit to Stand: Min assist;From elevated surface              Balance Overall balance assessment: Needs assistance Sitting-balance support: No upper extremity supported;Feet supported Sitting balance-Leahy Scale:  Good     Standing balance support: Bilateral upper extremity supported;During functional activity Standing balance-Leahy Scale: Poor Standing balance comment: relies on external support                           ADL either performed or assessed with clinical judgement   ADL Overall ADL's : Needs assistance/impaired Eating/Feeding: Independent   Grooming: Set up;Sitting   Upper Body Bathing: Set up;Sitting   Lower Body Bathing: Minimal assistance;Moderate assistance;Sitting/lateral leans;Sit to/from stand   Upper Body Dressing : Set up;Sitting   Lower Body Dressing: Moderate assistance;Sitting/lateral leans;Sit to/from stand Lower Body Dressing Details (indicate cue type and reason): educate patient how to don darco shoe for R foot Toilet Transfer: Minimal assistance;Ambulation;RW;Regular Toilet;Cueing for safety Toilet Transfer Details (indicate cue type and reason): simulated with functional mobility and transfer to recliner, verbal cues for safety with walker and body mechanics         Functional mobility during ADLs: Minimal assistance;Rolling walker;Cueing for safety                    Pertinent Vitals/Pain Pain Assessment: Faces Pain Score: 6  Faces Pain Scale: Hurts little more Pain Location: right calf and ankle Pain Descriptors / Indicators: Aching;Burning;Discomfort;Nagging;Throbbing Pain Intervention(s): Monitored during session     Hand Dominance Right   Extremity/Trunk Assessment Upper Extremity Assessment Upper Extremity Assessment: Overall WFL for tasks assessed   Lower Extremity Assessment Lower Extremity Assessment: Defer to PT evaluation   Cervical / Trunk Assessment Cervical / Trunk Assessment: Kyphotic   Communication Communication Communication: No difficulties   Cognition Arousal/Alertness: Awake/alert Behavior  During Therapy: WFL for tasks assessed/performed Overall Cognitive Status: Within Functional Limits for tasks  assessed                                 General Comments: Pt cooperative - wanting to do things by himself   General Comments  HR up to 130 with activity            Home Living Family/patient expects to be discharged to:: Private residence Living Arrangements: Spouse/significant other Available Help at Discharge: Family Type of Home: House Home Access: Stairs to enter Technical brewer of Steps: 5 Entrance Stairs-Rails: (reports posts at bottom of steps) Home Layout: One level     Bathroom Shower/Tub: Occupational psychologist: [place] - [employer]          Prior Functioning/Environment Level of Independence: Independent        Comments: Pt reports he was walking with cane and denies recent falls.  pt said he was spending much of the time in the bed as his leg hurt.        OT Problem List: Decreased activity tolerance;Impaired balance (sitting and/or standing);Decreased safety awareness      OT Treatment/Interventions: Self-care/ADL training;Therapeutic exercise;Energy conservation;DME and/or AE instruction;Therapeutic activities;Patient/family education;Balance training    OT Goals(Current goals can be found in the care plan section) Acute Rehab OT Goals Patient Stated Goal: to get home wtih family help OT Goal Formulation: With patient Time For Goal Achievement: 04/22/19 Potential to Achieve Goals: Good  OT Frequency: Min 2X/week           Co-evaluation PT/OT/SLP Co-Evaluation/Treatment: Yes Reason for Co-Treatment: To address functional/ADL transfers   OT goals addressed during session: ADL's and self-care      AM-PAC OT "6 Clicks" Daily Activity     Outcome Measure Help from another person eating meals?: None Help from another person taking care of personal grooming?: A Little Help from another person toileting, which includes using toliet, bedpan, or urinal?: A Little Help from another  person bathing (including washing, rinsing, drying)?: A Little Help from another person to put on and taking off regular upper body clothing?: A Little Help from another person to put on and taking off regular lower body clothing?: A Lot 6 Click Score: 18   End of Session Equipment Utilized During Treatment: Rolling walker;Gait belt Nurse Communication: Mobility status  Activity Tolerance: Patient tolerated treatment well Patient left: in chair;with call bell/phone within reach;with chair alarm set  OT Visit Diagnosis: Unsteadiness on feet (R26.81);Pain Pain - Right/Left: Right Pain - part of body: Leg;Ankle and joints of foot                Time: 7673-4193 OT Time Calculation (min): 23 min Charges:  OT General Charges $OT Visit: 1 Visit OT Evaluation $OT Eval Moderate Complexity: [place] OT OT office: [place] 04/08/2019, 2:26 PM

## 2019-04-09 DIAGNOSIS — I96 Gangrene, not elsewhere classified: Secondary | ICD-10-CM | POA: Diagnosis not present

## 2019-04-09 DIAGNOSIS — L97519 Non-pressure chronic ulcer of other part of right foot with unspecified severity: Secondary | ICD-10-CM

## 2019-04-09 DIAGNOSIS — E1152 Type 2 diabetes mellitus with diabetic peripheral angiopathy with gangrene: Secondary | ICD-10-CM | POA: Diagnosis not present

## 2019-04-09 DIAGNOSIS — L97509 Non-pressure chronic ulcer of other part of unspecified foot with unspecified severity: Secondary | ICD-10-CM | POA: Diagnosis not present

## 2019-04-09 DIAGNOSIS — E11621 Type 2 diabetes mellitus with foot ulcer: Secondary | ICD-10-CM | POA: Diagnosis not present

## 2019-04-09 DIAGNOSIS — I10 Essential (primary) hypertension: Secondary | ICD-10-CM | POA: Diagnosis not present

## 2019-04-09 LAB — CBC WITH DIFFERENTIAL/PLATELET
Abs Immature Granulocytes: 0.03 10*3/uL (ref 0.00–0.07)
Basophils Absolute: 0 10*3/uL (ref 0.0–0.1)
Basophils Relative: 0 %
Eosinophils Absolute: 0.1 10*3/uL (ref 0.0–0.5)
Eosinophils Relative: 1 %
HCT: 30.6 % — ABNORMAL LOW (ref 39.0–52.0)
Hemoglobin: 10 g/dL — ABNORMAL LOW (ref 13.0–17.0)
Immature Granulocytes: 0 %
Lymphocytes Relative: 12 %
Lymphs Abs: 1 10*3/uL (ref 0.7–4.0)
MCH: 27.4 pg (ref 26.0–34.0)
MCHC: 32.7 g/dL (ref 30.0–36.0)
MCV: 83.8 fL (ref 80.0–100.0)
Monocytes Absolute: 0.7 10*3/uL (ref 0.1–1.0)
Monocytes Relative: 8 %
Neutro Abs: 6.4 10*3/uL (ref 1.7–7.7)
Neutrophils Relative %: 79 %
Platelets: 226 10*3/uL (ref 150–400)
RBC: 3.65 MIL/uL — ABNORMAL LOW (ref 4.22–5.81)
RDW: 11.6 % (ref 11.5–15.5)
WBC: 8.2 10*3/uL (ref 4.0–10.5)
nRBC: 0 % (ref 0.0–0.2)

## 2019-04-09 LAB — COMPREHENSIVE METABOLIC PANEL
ALT: 21 U/L (ref 0–44)
AST: 23 U/L (ref 15–41)
Albumin: 2.5 g/dL — ABNORMAL LOW (ref 3.5–5.0)
Alkaline Phosphatase: 102 U/L (ref 38–126)
Anion gap: 9 (ref 5–15)
BUN: 5 mg/dL — ABNORMAL LOW (ref 8–23)
CO2: 26 mmol/L (ref 22–32)
Calcium: 8.3 mg/dL — ABNORMAL LOW (ref 8.9–10.3)
Chloride: 97 mmol/L — ABNORMAL LOW (ref 98–111)
Creatinine, Ser: 0.97 mg/dL (ref 0.61–1.24)
GFR calc Af Amer: >60 mL/min (ref >60)
GFR calc non Af Amer: >60 mL/min (ref >60)
Glucose, Bld: 259 mg/dL — ABNORMAL HIGH (ref 70–99)
Potassium: 3.4 mmol/L — ABNORMAL LOW (ref 3.5–5.1)
Sodium: 132 mmol/L — ABNORMAL LOW (ref 135–145)
Total Bilirubin: 0.5 mg/dL (ref 0.3–1.2)
Total Protein: 6.4 g/dL — ABNORMAL LOW (ref 6.5–8.1)

## 2019-04-09 LAB — RETICULOCYTES
Immature Retic Fract: 8.6 % (ref 2.3–15.9)
RBC.: 3.68 MIL/uL — ABNORMAL LOW (ref 4.22–5.81)
Retic Count, Absolute: 44.9 10*3/uL (ref 19.0–186.0)
Retic Ct Pct: 1.2 % (ref 0.4–3.1)

## 2019-04-09 LAB — IRON AND TIBC
Iron: 9 ug/dL — ABNORMAL LOW (ref 45–182)
Saturation Ratios: 5 % — ABNORMAL LOW (ref 17.9–39.5)
TIBC: 175 ug/dL — ABNORMAL LOW (ref 250–450)
UIBC: 166 ug/dL

## 2019-04-09 LAB — PHOSPHORUS: Phosphorus: 2.1 mg/dL — ABNORMAL LOW (ref 2.5–4.6)

## 2019-04-09 LAB — GLUCOSE, CAPILLARY
Glucose-Capillary: 198 mg/dL — ABNORMAL HIGH (ref 70–99)
Glucose-Capillary: 171 mg/dL — ABNORMAL HIGH (ref 70–99)
Glucose-Capillary: 138 mg/dL — ABNORMAL HIGH (ref 70–99)

## 2019-04-09 LAB — VITAMIN B12: Vitamin B-12: 583 pg/mL (ref 180–914)

## 2019-04-09 LAB — FOLATE: Folate: 7.1 ng/mL (ref >5.9)

## 2019-04-09 LAB — FERRITIN: Ferritin: 320 ng/mL (ref 24–336)

## 2019-04-09 LAB — MAGNESIUM: Magnesium: 2 mg/dL (ref 1.7–2.4)

## 2019-04-09 MED ORDER — DOCUSATE SODIUM 100 MG PO CAPS: 100.0000 mg | ORAL_CAPSULE | Freq: Every day | ORAL | 0 refills | Status: AC

## 2019-04-09 MED ORDER — POTASSIUM CHLORIDE CRYS ER 20 MEQ PO TBCR
40.0000 meq | EXTENDED_RELEASE_TABLET | Freq: Once | ORAL | Status: AC
  Administered 2019-04-09: 18:00:00 40 meq via ORAL
  Filled 2019-04-09: qty 2

## 2019-04-09 MED ORDER — POLYSACCHARIDE IRON COMPLEX 150 MG PO CAPS: 150.0000 mg | ORAL_CAPSULE | Freq: Every day | ORAL | 0 refills | Status: DC

## 2019-04-09 MED ORDER — NICOTINE 21 MG/24HR TD PT24: 21.0000 mg | MEDICATED_PATCH | Freq: Every day | TRANSDERMAL | 0 refills | Status: DC

## 2019-04-09 MED ORDER — HYDROCERIN EX CREA: 1.0000 "application " | TOPICAL_CREAM | Freq: Two times a day (BID) | CUTANEOUS | 0 refills | Status: DC

## 2019-04-09 MED ORDER — POLYSACCHARIDE IRON COMPLEX 150 MG PO CAPS
150.0000 mg | ORAL_CAPSULE | Freq: Every day | ORAL | Status: DC
  Administered 2019-04-09: 150 mg via ORAL
  Filled 2019-04-09: qty 1

## 2019-04-09 MED ORDER — K PHOS MONO-SOD PHOS DI & MONO 155-852-130 MG PO TABS
500.0000 mg | ORAL_TABLET | Freq: Two times a day (BID) | ORAL | Status: DC
  Administered 2019-04-09: 500 mg via ORAL
  Filled 2019-04-09 (×2): qty 2

## 2019-04-09 MED ORDER — BLOOD GLUCOSE MONITOR KIT: PACK | 0 refills | Status: AC

## 2019-04-09 MED ORDER — METFORMIN HCL 1000 MG PO TABS: 1000.0000 mg | ORAL_TABLET | Freq: Two times a day (BID) | ORAL | 0 refills | Status: AC

## 2019-04-09 MED ORDER — ROSUVASTATIN CALCIUM 10 MG PO TABS: 10.0000 mg | ORAL_TABLET | Freq: Every day | ORAL | 0 refills | Status: AC

## 2019-04-09 MED ORDER — PANTOPRAZOLE SODIUM 40 MG PO TBEC: 40.0000 mg | DELAYED_RELEASE_TABLET | Freq: Every day | ORAL | 0 refills | Status: AC

## 2019-04-09 MED ORDER — ASPIRIN 81 MG PO TBEC: 81.0000 mg | DELAYED_RELEASE_TABLET | Freq: Every day | ORAL | 0 refills | Status: DC

## 2019-04-09 MED ORDER — GLIPIZIDE 5 MG PO TABS: 5.0000 mg | ORAL_TABLET | Freq: Every day | ORAL | 0 refills | Status: AC

## 2019-04-09 MED ORDER — OXYCODONE HCL 5 MG PO TABS: 5.0000 mg | ORAL_TABLET | ORAL | 0 refills | Status: DC | PRN

## 2019-04-09 NOTE — Progress Notes (Signed)
Inpatient Diabetes Program Recommendations  AACE/ADA: New Consensus Statement on Inpatient Glycemic Control (2015)  Target Ranges:  Prepandial:   less than 140 mg/dL      Peak postprandial:   less than 180 mg/dL (1-2 hours)      Critically ill patients:  140 - 180 mg/dL   Lab Results  Component Value Date   GLUCAP 171 (H) 04/09/2019   HGBA1C 9.6 (H) 04/04/2019    Review of Glycemic Control  Received call from RN regarding pt's new PCP. Dr. Garner Nash at [facility] will be managing pt's diabetes care.   No questions from patient per RN.  Thank you. Ailene Ards, RD, LDN, CDE Inpatient Diabetes Coordinator [phone]

## 2019-04-09 NOTE — Progress Notes (Addendum)
  Progress Note    04/09/2019 8:26 AM 2 Days Post-Op  Subjective:  No complaints, ready to go home    Vitals:   04/09/19 0553 04/09/19 0557  BP: (!) 141/68   Pulse:    Resp: 18   Temp: 98.4 F (36.9 C) 98.4 F (36.9 C)  SpO2: 98%    Physical Exam: General: Well nourished, eating breakfast and on the phone when I entered the room Lungs:  Non labored Incisions:  Right lower extremity incisions clean, dry, intact. Mild ecchymosis of medial thigh incision. Right 1st and 2nd toe amputations intact, clean, and dry. Dressings reapplied Extremities:  2+ right femoral pulse, brisk doppler PT. Monophasic Doppler DP/ [place] Neurologic: alert and oriented  CBC    Component Value Date/Time   WBC 7.2 04/08/2019 0253   RBC 3.52 (L) 04/08/2019 0253   HGB 9.6 (L) 04/08/2019 0253   HCT 29.2 (L) 04/08/2019 0253   HCT 44 (A) 11/14/2015 0758   PLT 219 04/08/2019 0253   MCV 83.0 04/08/2019 0253   MCV 87.8 11/14/2015 0758   MCH 27.3 04/08/2019 0253   MCHC 32.9 04/08/2019 0253   RDW 11.5 04/08/2019 0253   RDW 13.7 11/14/2015 0758   LYMPHSABS 1.5 04/06/2019 0320   MONOABS 0.6 04/06/2019 0320   EOSABS 0.1 04/06/2019 0320   EOSABS 180 11/14/2015 0758   BASOSABS 0.0 04/06/2019 0320    BMET    Component Value Date/Time   NA 130 (L) 04/08/2019 0253   NA 137 11/14/2015 0758   K 3.8 04/08/2019 0253   K 4.1 11/14/2015 0758   CL 97 (L) 04/08/2019 0253   CL 102 11/14/2015 0758   CO2 25 04/08/2019 0253   CO2 26 11/14/2015 0758   GLUCOSE 210 (H) 04/08/2019 0253   BUN 6 (L) 04/08/2019 0253   CREATININE 0.88 04/08/2019 0253   CREATININE 1.03 11/14/2015 0758   CALCIUM 8.2 (L) 04/08/2019 0253   CALCIUM 9.1 11/14/2015 0758   GFRNONAA >60 04/08/2019 0253   GFRAA >60 04/08/2019 0253    INR No results found for: INR   Intake/Output Summary (Last 24 hours) at 04/09/2019 0826 Last data filed at 04/09/2019 0330 Gross per 24 hour  Intake 440 ml  Output 2975 ml  Net -2535 ml      Assessment/Plan:  64 y.o. 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 Adequate perfusion of the right lower extremity. Right leg/foot warm. Motor and sensory intact. Has been up ambulating. Brisk PT doppler signal. Doppler DP and Peroneal. Amputation site intact and healing well. Continue Aspirin and Crestor. Continue PT/OT with plan to go home with home PT. Will d/c zosyn today. From vascular standpoint he is okay for discharge and will plan to see him in follow up in 2-3 weeks   DVT prophylaxis:  SQ heparin   Graceann Congress, PA-C [facility] [phone] 04/09/2019 8:26 AM   I have independently interviewed and examined patient and agree with PA assessment and plan above. Ok for discharge. Will have f/u in 2-3 weeks to remove suture in right foot and plan lle bypass. Dr. Ardelle Anton following left 2nd toe closely.  Laurana Magistro C. Randie Heinz, MD [facility] [place] Office: [phone] Pager: [phone]

## 2019-04-09 NOTE — Discharge Summary (Signed)
Physician Discharge Summary  Juan Turner GPQ:[id number] DOB: 1955-06-26 DOA: 04/04/2019  PCP: Patient, No Pcp Per  Admit date: 04/04/2019 Discharge date: 04/09/2019  Admitted From: Home Disposition: Home with Home Health  Recommendations for Outpatient Follow-up:  1. Follow up with PCP in 1-2 weeks 2. Follow up with Vascular Surgery within 1-2 weeks 3. Follow up with Podiatry in 1-2 weeks  4. Please obtain CMP/CBC, Mag, Phos in one week 5. 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 Patient followed by podiatry Dr. Luella Cook poor outpatient follow-up.He originally presented to podiatry clinic for a wound on the left second toe which has been improving -He was referred to vascular studies but could not complete the study or f/u Vascular surgery due to insurance issues.Patient recently had what appeared to be an injury to his right hallux toenail and when he presented to the office there was drainage of pus underneath the toenail. Dr Lysle Dingwall debrided the loose toenail at that time and the wound was already present. He was started on antibiotics. He now presented to [facility] and found to have gangrene of his right hallux.  ED Course: Afebrile with mildly elevated blood pressure, there was no leukocytosis, patient was started on antibiotics. Hospital course: Patient admitted to [facility] service with broad-spectrum Vanco and Zosyn,  underwent angio 2/23 morning by Dr Oneida Alar and found to have bilateral popliteal artery occlusion,bilateral one-vessel posterior tibial  artery runoff. Dr. Oneida Alar recommendedbypass surgery and right great toe amputation on 04/07/2019 by Dr. Donzetta Matters. Started on aspirin, Crestor daily.  He was also evaluated by podiatry and he did well during the course of his hospitalization.  His IV Zosyn was stopped by vascular surgery and he is stable for discharge and was discharged home with home health services today.  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 -Was afebrile until last night with a TMAx of 100.9, no leukocytosis.  -Vascular surgery on board,underwent right SFA to posterior tibial artery bypass with harvested right greater saphenous vein, or transmetatarsal amputation of right first and second toes by Dr. Donzetta Matters. -Continued Zosyn for nowgiven left foot issues, offvancomycin but stopped by Vascular.  -Seen by podiatry  and Appreciate Dr. Jacqualyn Posey following up. Started on aspirin, Crestor daily in this admission. -It is clear from a vascular perspective as well as an podiatry perspective as Dr. Earleen Newport does not feel that his left toe is infected.  Patient is improved and will need to follow-up with vascular and podiatry within 1 to 2 weeks  Diabetes mellitus type 2, Uncontrolled: -HgbA1c 9.6 on 04/04/2019. SSI for now, Accu-Cheks as an outpatient,  -resumedoral hypoglycemicsas an outpatient and continue glipizide and have added Metformin 1000 mg p.o. twice daily -CBG's ranging from 124-250 -Follow-up with PCP for further blood sugar management  Hypertension -Continue amlodipine, benazepril at D/C  Mild Hyponatremia -Combination ofpseudohyponatremia in the setting of hyperglycemiaandpain related?SIADH. -Worsening with IV fluids-unlikely to  be related to dehydration.  Will DC IV fluids and monitor. -Edema is now trending up and went from 130 -> 132 Plan continue monitor and trend in the outpatient setting  Tobacco abuse -Advised to quit as contributing to PVD, continue nicotine patch  Normocytic Anemia -Patient's Hb/Hct yesterday was 9.6/29.2 and repeat today 10.0/30.6 -Checked Anemia Panel this AM and showed an iron level of 9, U IBC 166, TIBC of 175, saturation ratio of 5%, ferritin level 320, folate of 7.1, vitamin B12 level of 583 -Started the patient on Niferex 150 mg p.o. daily and will continue at discharge -Continue to Monitor for S/Sx of Bleeding; Currently no overt bleeding noted -Repeat CBC in AM   Hypokalemia -Patient's potassium this morning was 3.4 -Replete prior to discharge -Continue to monitor and replete as necessary next-repeat CMP within 1 week  Hypophosphatemia  -Patient's phosphorus level this morning was 2.1 -Replete with p.o. K-Phos Neutral 500 mg p.o. twice daily x2 doses next-continue to monitor and replete as necessary -Repeat phosphorus level in the outpatient setting  Discharge Instructions  Allergies as of 04/09/2019   No Known Allergies     Medication List    STOP taking these medications   doxycycline 100 MG tablet Commonly known as: VIBRA-TABS   ibuprofen 600 MG tablet Commonly known as: ADVIL   mupirocin ointment 2 % Commonly known as: BACTROBAN     TAKE these medications   amLODipine-benazepril 5-10 MG capsule Commonly known as: LOTREL Take 1 capsule by mouth daily.   aspirin 81 MG EC tablet Take 1 tablet (81 mg total) by mouth daily. Start taking on: April 10, 2019   blood glucose meter kit and supplies Kit Dispense based on patient and insurance preference. Use up to four times daily as directed. (FOR ICD-9 250.00, 250.01).   docusate sodium 100 MG capsule Commonly known as: COLACE Take 1 capsule (100 mg total) by mouth daily. Start taking on: April 10, 2019   gabapentin 100 MG capsule Commonly known as: NEURONTIN Take 1 capsule (100 mg total) by mouth at bedtime.   glipiZIDE 5 MG tablet Commonly known as: GLUCOTROL Take 1 tablet (5 mg total) by mouth daily before breakfast.   hydrocerin Crea Apply 1 application topically 2 (two) times daily.   metFORMIN 1000 MG tablet Commonly known as: Glucophage Take 1 tablet (1,000 mg total) by mouth 2 (two) times daily with a meal.   nicotine 21 mg/24hr patch Commonly known as: NICODERM CQ - dosed in mg/24 hours Place 1 patch (21 mg total) onto the skin daily. Start taking on: April 10, 2019   oxyCODONE 5 MG immediate release tablet Commonly known as: Oxy IR/ROXICODONE Take 1-2 tablets (5-10 mg total) by mouth every 4 (four) hours as needed for moderate pain.   pantoprazole 40 MG tablet Commonly known as: PROTONIX Take 1 tablet (40 mg total) by mouth daily. Start taking on: April 10, 2019   rosuvastatin 10 MG tablet Commonly known as: CRESTOR Take 1 tablet (10 mg total) by mouth daily at 6 PM.      Follow-up Information    Waynetta Sandy, MD In 3 weeks.   Specialties: Vascular Surgery, Cardiology Why: Office will call you to arrange your appt (sent) Contact information: [address] [place] [address] [phone]          No Known Allergies  Consultations:  Vascular surgery  Podiatry  Procedures/Studies: PERIPHERAL VASCULAR CATHETERIZATION  Result Date: 04/05/2019 Procedure: Abdominal aortogram with bilateral lower extremity runoff, second order catheterization right  external iliac artery, retrograde left common femoral puncture, ultrasound left groin Preoperative diagnosis: Gangrene right first toe left second toe Postoperative diagnosis: Same Anesthesia: Local with IV sedation Sedation time: 51 minutes Operative findings: 1.  Bilateral popliteal artery occlusion 2.  Bilateral one-vessel posterior tibial artery runoff Operative details:  After team informed consent, the patient and the PV lab.  The patient was placed in supine position angio table.  Both groins were prepped and draped in usual sterile fashion.  Local anesthesia was infiltrated over the left common femoral artery.  Ultrasound was used to identify the left common femoral artery and femoral bifurcation.  Using ultrasound guidance an introducer needle was used to cannulate the left common femoral artery and an 035 versa core wire threaded up the abdominal aorta under fluoroscopic guidance.  Next a 5 French sheath was placed over the guidewire in the left common femoral artery.  This was thoroughly flushed with heparinized saline.  5 French pigtail catheter was then advanced over the guidewire in the abdominal aorta and abdominal aortogram was obtained in AP projection.  The left and right renal arteries are patent.  The infrarenal abdominal aorta is widely patent with no flow-limiting stenosis.  There is mild calcification.  The left and right common external and internal iliac arteries are all patent.  Next the pigtail catheter is pulled down just above the aortic bifurcation and a pelvic angiogram obtained to further clarify the above findings.  Left and right common femoral artery is also patent. Next bilateral lower extremity runoff views were obtained through the pigtail catheter. In the left lower extremity, the left common femoral profunda femoris and superficial femoral arteries are all patent.  The popliteal artery occludes above the knee.  It then reconstitutes is a very diseased below-knee popliteal artery and a diminutive peroneal artery.  The origins of the anterior tibial and posterior tibial artery are occluded.  The posterior tibial artery then reconstitutes and is single-vessel runoff to the left foot.  Additional stationary views were obtained from the below-knee segment of the foot including the lateral foot view to further clarify the anatomy on the left side. In the  right lower extremity there are similar findings with one-vessel posterior tibial artery runoff a very diseased popliteal artery there is a diminutive peroneal artery and the anterior tibial artery occludes in the proximal leg. At this point the pigtail catheter was removed.  A 5 French crossover catheter was exchanged over the guidewire and the right common iliac followed by the right external iliac artery selected and the crossover catheter swapped out for 5 French straight catheter.  Additional lower extremity views on the right side were made to further clarify the below-knee popliteal and tibial vessels as well as lateral foot view. Patient tired procedure well and there were no complications.  Patient was taken to the holding area in stable condition after the straight catheter was removed.  The 5 French sheath was left in place to be pulled the holding area. Operative management: Patient will be scheduled in the near future for a right femoral to posterior tibial artery bypass with the proximal target site determined by adequacy of venous conduit.  The patient will also need amputation of his first toe.  He will be maintained on vancomycin and Zosyn antibiotic coverage until the time of his bypass procedure. He will be started on aspirin 81 mg once daily and Crestor 10 mg once daily for long-term management of his peripheral arterial disease. Ruta Hinds,  MD [facility] Office: [phone]   DG Foot Complete Right  Result Date: 04/04/2019 CLINICAL DATA:  Right foot pain with great toe ulcer. EXAM: RIGHT FOOT COMPLETE - 3+ VIEW COMPARISON:  None. FINDINGS: Soft tissue defect noted great toe, compatible with the reported history of ulcer. No underlying bony erosion/destruction in the phalanges of the great toe to suggest osteomyelitis. No evidence for fracture or dislocation. Mild degenerative changes noted MTP joint great toe. IMPRESSION: Soft tissue ulcer noted in the  great toe without underlying bony destruction to suggest osteomyelitis by x-ray. Electronically Signed   By: Misty Stanley M.D.   On: 04/04/2019 09:54   VAS [place] ABI WITH/WO TBI  Result Date: 04/06/2019 LOWER EXTREMITY DOPPLER STUDY Indications: Gangrene, and known bilateral occlusions.  Comparison Study: Aortogram 04-05-19. Performing Technologist: Baldwin Crown RVT, RDMS  Examination Guidelines: A complete evaluation includes at minimum, Doppler waveform signals and systolic blood pressure reading at the level of bilateral brachial, anterior tibial, and posterior tibial arteries, when vessel segments are accessible. Bilateral testing is considered an integral part of a complete examination. Photoelectric Plethysmograph (PPG) waveforms and toe systolic pressure readings are included as required and additional duplex testing as needed. Limited examinations for reoccurring indications may be performed as noted.  ABI Findings: +---------+------------------+-----+----------+--------------------------+ Right    Rt Pressure (mmHg)IndexWaveform  Comment                    +---------+------------------+-----+----------+--------------------------+ Brachial 160                    triphasic                            +---------+------------------+-----+----------+--------------------------+ PTA      65                0.40 monophasic                           +---------+------------------+-----+----------+--------------------------+ DP                              absent                               +---------+------------------+-----+----------+--------------------------+ Great Toe                                 not done due to open wound +---------+------------------+-----+----------+--------------------------+ +---------+------------------+-----+---------+-------+ Left     Lt Pressure (mmHg)IndexWaveform Comment +---------+------------------+-----+---------+-------+ Brachial 162                     triphasic        +---------+------------------+-----+---------+-------+ PTA                             absent           +---------+------------------+-----+---------+-------+ DP                              absent           +---------+------------------+-----+---------+-------+ Great Toe  Absent           +---------+------------------+-----+---------+-------+ +-------+-----------+-----------+------------+------------+ ABI/TBIToday's ABIToday's TBIPrevious ABIPrevious TBI +-------+-----------+-----------+------------+------------+ Right  0.4                                            +-------+-----------+-----------+------------+------------+ When innsonating the Left PTA there was a slight pulsatile waveform but obliterated with slight pressure, possibly a vein or a very monophasic PTA.  Summary: Right: Resting right ankle-brachial index indicates severe right lower extremity arterial disease. Left: Resting left ankle-brachial index indicates critical left limb ischemia. When innsonating the Left PTA there was a slight pulsatile waveform but obliterated with slight pressure, possibly a vein or a very monophasic PTA. Results were discussed with Dr. Donzetta Matters.  *See table(s) above for measurements and observations.  Electronically signed by Harold Barban MD on 04/06/2019 at 10:30:51 PM.   Final    VAS [place] LOWER EXTREMITY SAPHENOUS VEIN MAPPING  Result Date: 04/05/2019 LOWER EXTREMITY VEIN MAPPING Indications:       Pre-op Other Indications: Status post arteriogram revealed bilateral popliteal artery                    occlusion with one-vessel PTA runoff bilaterally. Risk Factors:      Hypertension, Diabetes, current smoker.  Performing Technologist: Oda Cogan RDMS, RVT  Examination Guidelines: A complete evaluation includes B-mode imaging, spectral Doppler, color Doppler, and power Doppler as needed of all accessible portions of each vessel.  Bilateral testing is considered an integral part of a complete examination. Limited examinations for reoccurring indications may be performed as noted. +--------------+--------------+-------------------+-------------+--------------+  RT Diameter   RT Findings          GSV         LT Diameter  LT Findings        (cm)                                          (cm)                    +--------------+--------------+-------------------+-------------+--------------+      3.90     branching and   Saphenofemoral       3.00                                   Medial vein       Junction                                                    mapped                                                   +--------------+--------------+-------------------+-------------+--------------+      4.40                     Proximal thigh       2.90                    +--------------+--------------+-------------------+-------------+--------------+  2.50                        Mid thigh         2.70                    +--------------+--------------+-------------------+-------------+--------------+      2.20                      Distal thigh        2.60                    +--------------+--------------+-------------------+-------------+--------------+      2.60                          Knee            2.70                    +--------------+--------------+-------------------+-------------+--------------+      2.50       branching        Prox calf         1.50        chronic                                                                    thrombus    +--------------+--------------+-------------------+-------------+--------------+      1.80                        Mid calf          1.90        chronic                                                                    thrombus    +--------------+--------------+-------------------+-------------+--------------+      1.70                        Distal calf        1.20        chronic                                                                    thrombus    +--------------+--------------+-------------------+-------------+--------------+      2.10                          Ankle           1.30        chronic  thrombus    +--------------+--------------+-------------------+-------------+--------------+ Diagnosing physician: Ruta Hinds MD Electronically signed by Ruta Hinds MD on 04/05/2019 at 11:44:29 AM.    Final    VAS [place] UPPER EXT VEIN MAPPING (PRE-OP AVF)  Result Date: 04/05/2019 UPPER EXTREMITY VEIN MAPPING  Indications: History of PAD; patient is pre-operative for bypass. Comparison Study: No prior study Performing Technologist: Maudry Mayhew MHA, RDMS, RVT, RDCS  Examination Guidelines: A complete evaluation includes B-mode imaging, spectral Doppler, color Doppler, and power Doppler as needed of all accessible portions of each vessel. Bilateral testing is considered an integral part of a complete examination. Limited examinations for reoccurring indications may be performed as noted. +-----------------+-------------+----------+--------+ Right Cephalic   Diameter (cm)Depth (cm)Findings +-----------------+-------------+----------+--------+ Shoulder             0.26                        +-----------------+-------------+----------+--------+ Prox upper arm       0.22                        +-----------------+-------------+----------+--------+ Mid upper arm        0.16                        +-----------------+-------------+----------+--------+ Dist upper arm       0.19                        +-----------------+-------------+----------+--------+ Antecubital fossa    0.22                        +-----------------+-------------+----------+--------+ Prox forearm         0.28               thrombus  +-----------------+-------------+----------+--------+ Mid forearm          0.17                        +-----------------+-------------+----------+--------+ Dist forearm         0.17                        +-----------------+-------------+----------+--------+ +-----------------+-------------+----------+--------------+ Right Basilic    Diameter (cm)Depth (cm)   Findings    +-----------------+-------------+----------+--------------+ Prox upper arm                          not visualized +-----------------+-------------+----------+--------------+ Mid upper arm                           not visualized +-----------------+-------------+----------+--------------+ Dist upper arm                          not visualized +-----------------+-------------+----------+--------------+ Antecubital fossa                       not visualized +-----------------+-------------+----------+--------------+ Prox forearm                            not visualized +-----------------+-------------+----------+--------------+ Mid forearm                             not visualized +-----------------+-------------+----------+--------------+ Distal forearm  not visualized +-----------------+-------------+----------+--------------+ Wrist                                   not visualized +-----------------+-------------+----------+--------------+ +-----------------+-------------+----------+--------------+ Left Cephalic    Diameter (cm)Depth (cm)   Findings    +-----------------+-------------+----------+--------------+ Shoulder             0.24                              +-----------------+-------------+----------+--------------+ Prox upper arm       0.19                              +-----------------+-------------+----------+--------------+ Mid upper arm        0.14                 branching    +-----------------+-------------+----------+--------------+  Dist upper arm       0.13                              +-----------------+-------------+----------+--------------+ Antecubital fossa    0.25                              +-----------------+-------------+----------+--------------+ Prox forearm         0.15                 branching    +-----------------+-------------+----------+--------------+ Mid forearm                             not visualized +-----------------+-------------+----------+--------------+ Dist forearm                            not visualized +-----------------+-------------+----------+--------------+ Wrist                                   not visualized +-----------------+-------------+----------+--------------+ +-----------------+-------------+----------+--------------+ Left Basilic     Diameter (cm)Depth (cm)   Findings    +-----------------+-------------+----------+--------------+ Prox upper arm                          not visualized +-----------------+-------------+----------+--------------+ Mid upper arm        0.21                              +-----------------+-------------+----------+--------------+ Dist upper arm       0.20                              +-----------------+-------------+----------+--------------+ Antecubital fossa    0.30                              +-----------------+-------------+----------+--------------+ Prox forearm         0.20                              +-----------------+-------------+----------+--------------+ Mid forearm  0.18                              +-----------------+-------------+----------+--------------+ Distal forearm       0.22                              +-----------------+-------------+----------+--------------+ Wrist                                   not visualized +-----------------+-------------+----------+--------------+ *See table(s) above for measurements and observations.  Diagnosing physician: Servando Snare MD Electronically signed by Servando Snare MD on 04/05/2019 at 6:10:29 PM.    Final    Procedure Performed by Dr. Donzetta Matters on 04/07/2019 1.Harvest right greater saphenous vein 2.Right SFA to posterior tibial artery bypass with reversed translocated ipsilateral greater saphenous vein 3.Transmetatarsal amputation right first and second toes  Subjective: Seen and examined at bedside and he is doing well.  States he had no problems with pain in his foot and states that he is doing relatively well.  No nausea or vomiting.  Had a slight temperature yesterday but is improved today.  No other concerns or points this time and denies any other complaints this ready to go home.  Discharge Exam: Vitals:   04/09/19 0557 04/09/19 0827  BP:  130/75  Pulse:  75  Resp:  18  Temp: 98.4 F (36.9 C) 99 F (37.2 C)  SpO2:  98%   Vitals:   04/09/19 0026 04/09/19 0553 04/09/19 0557 04/09/19 0827  BP: 130/73 (!) 141/68  130/75  Pulse: 77   75  Resp: 20 18  18   Temp: 99.1 F (37.3 C) 98.4 F (36.9 C) 98.4 F (36.9 C) 99 F (37.2 C)  TempSrc: Oral Oral Oral Oral  SpO2: 99% 98%  98%  Weight:      Height:       General: Pt is alert, awake, not in acute distress Cardiovascular: RRR, S1/S2 +, no rubs, no gallops Respiratory: Diminished bilaterally, no wheezing, no rhonchi Abdominal: Soft, NT, ND, bowel sounds + Extremities: Right foot is wrapped and left foot has some mild discoloration on his second toe, no cyanosis  The results of significant diagnostics from this hospitalization (including imaging, microbiology, ancillary and laboratory) are listed below for reference.    Microbiology: Recent Results (from the past 240 hour(s))  SARS CORONAVIRUS 2 (TAT 6-24 HRS) Nasopharyngeal Nasopharyngeal Swab     Status: None   Collection Time: 04/04/19  4:10 PM   Specimen: Nasopharyngeal Swab  Result Value Ref Range Status   SARS Coronavirus 2 NEGATIVE NEGATIVE Final    Comment: (NOTE) SARS-CoV-2  target nucleic acids are NOT DETECTED. The SARS-CoV-2 RNA is generally detectable in upper and lower respiratory specimens during the acute phase of infection. Negative results do not preclude SARS-CoV-2 infection, do not rule out co-infections with other pathogens, and should not be used as the sole basis for treatment or other patient management decisions. Negative results must be combined with clinical observations, patient history, and epidemiological information. The expected result is Negative. Fact Sheet for Patients: SugarRoll.be Fact Sheet for Healthcare Providers: https://www.woods-mathews.com/ This test is not yet approved or cleared by the [place] FDA and  has been authorized for detection and/or diagnosis of SARS-CoV-2 by FDA under an Emergency Use Authorization (EUA). This EUA will remain  in effect (meaning this test can be used) for the duration of the COVID-19 declaration under Section 56 4(b)(1) of the Act, 21 U.S.C. section 360bbb-3(b)(1), unless the authorization is terminated or revoked sooner. Performed at [facility], [place] [address] [place], [place] [address]   Surgical pcr screen     Status: None   Collection Time: 04/06/19  7:54 PM   Specimen: Nasal Mucosa; Nasal Swab  Result Value Ref Range Status   MRSA, PCR NEGATIVE NEGATIVE Final   Staphylococcus aureus NEGATIVE NEGATIVE Final    Comment: (NOTE) The Xpert SA Assay (FDA approved for NASAL specimens in patients 8 years of age and older), is one component of a comprehensive surveillance program. It is not intended to diagnose infection nor to guide or monitor treatment. Performed at [facility], [place] [address] [place], [place] [address]     Labs: BNP (last 3 results) No results for input(s): BNP in the last 8760 hours. Basic Metabolic Panel: Recent Labs  Lab 04/04/19 0954 04/04/19 1901 04/05/19 0300 04/06/19 0320 04/08/19 0253  NA  132*  --  134* 133* 130*  K 4.3  --  3.6 3.8 3.8  CL 96*  --  98 98 97*  CO2 25  --  22 23 25   GLUCOSE 321*  --  160* 155* 210*  BUN 13  --  10 8 6*  CREATININE 1.04 0.90 0.85 0.94 0.88  CALCIUM 9.4  --  9.0 8.6* 8.2*   Liver Function Tests: No results for input(s): AST, ALT, ALKPHOS, BILITOT, PROT, ALBUMIN in the last 168 hours. No results for input(s): LIPASE, AMYLASE in the last 168 hours. No results for input(s): AMMONIA in the last 168 hours. CBC: Recent Labs  Lab 04/04/19 0954 04/04/19 0954 04/04/19 1901 04/05/19 0300 04/06/19 0320 04/08/19 0253 04/09/19 0820  WBC 7.5   < > 8.9 7.6 6.6 7.2 8.2  NEUTROABS 5.2  --   --   --  4.4  --  6.4  HGB 13.6   < > 13.5 12.4* 11.8* 9.6* 10.0*  HCT 42.3   < > 40.6 37.1* 35.3* 29.2* 30.6*  MCV 85.8   < > 83.5 82.8 82.1 83.0 83.8  PLT 247   < > 288 271 237 219 226   < > = values in this interval not displayed.   Cardiac Enzymes: No results for input(s): CKTOTAL, CKMB, CKMBINDEX, TROPONINI in the last 168 hours. BNP: Invalid input(s): POCBNP CBG: Recent Labs  Lab 04/08/19 0612 04/08/19 1312 04/08/19 1641 04/08/19 2207 04/09/19 0555  GLUCAP 198* 187* 124* 250* 198*   D-Dimer No results for input(s): DDIMER in the last 72 hours. Hgb A1c No results for input(s): HGBA1C in the last 72 hours. Lipid Profile No results for input(s): CHOL, HDL, LDLCALC, TRIG, CHOLHDL, LDLDIRECT in the last 72 hours. Thyroid function studies No results for input(s): TSH, T4TOTAL, T3FREE, THYROIDAB in the last 72 hours.  Invalid input(s): FREET3 Anemia work up Recent Labs    04/09/19 0820  VITAMINB12 583  FOLATE 7.1  FERRITIN 320  TIBC 175*  IRON 9*  RETICCTPCT 1.2   Urinalysis    Component Value Date/Time   COLORURINE YELLOW 04/16/2011 1908   APPEARANCEUR CLEAR 04/16/2011 1908   LABSPEC 1.015 04/16/2011 1908   PHURINE 6.0 04/16/2011 1908   GLUCOSEU NEGATIVE 04/16/2011 1908   HGBUR NEGATIVE 04/16/2011 1908   BILIRUBINUR NEGATIVE  04/16/2011 1908   KETONESUR NEGATIVE 04/16/2011 1908   PROTEINUR NEGATIVE 04/16/2011  1908   UROBILINOGEN 0.2 04/16/2011 1908   NITRITE NEGATIVE 04/16/2011 1908   LEUKOCYTESUR NEGATIVE 04/16/2011 1908   Sepsis Labs Invalid input(s): PROCALCITONIN,  WBC,  LACTICIDVEN Microbiology Recent Results (from the past 240 hour(s))  SARS CORONAVIRUS 2 (TAT 6-24 HRS) Nasopharyngeal Nasopharyngeal Swab     Status: None   Collection Time: 04/04/19  4:10 PM   Specimen: Nasopharyngeal Swab  Result Value Ref Range Status   SARS Coronavirus 2 NEGATIVE NEGATIVE Final    Comment: (NOTE) SARS-CoV-2 target nucleic acids are NOT DETECTED. The SARS-CoV-2 RNA is generally detectable in upper and lower respiratory specimens during the acute phase of infection. Negative results do not preclude SARS-CoV-2 infection, do not rule out co-infections with other pathogens, and should not be used as the sole basis for treatment or other patient management decisions. Negative results must be combined with clinical observations, patient history, and epidemiological information. The expected result is Negative. Fact Sheet for Patients: SugarRoll.be Fact Sheet for Healthcare Providers: https://www.woods-mathews.com/ This test is not yet approved or cleared by the [place] FDA and  has been authorized for detection and/or diagnosis of SARS-CoV-2 by FDA under an Emergency Use Authorization (EUA). This EUA will remain  in effect (meaning this test can be used) for the duration of the COVID-19 declaration under Section 56 4(b)(1) of the Act, 21 U.S.C. section 360bbb-3(b)(1), unless the authorization is terminated or revoked sooner. Performed at [facility], [place] [address] [place], [place] [address]   Surgical pcr screen     Status: None   Collection Time: 04/06/19  7:54 PM   Specimen: Nasal Mucosa; Nasal Swab  Result Value Ref Range Status   MRSA, PCR NEGATIVE  NEGATIVE Final   Staphylococcus aureus NEGATIVE NEGATIVE Final    Comment: (NOTE) The Xpert SA Assay (FDA approved for NASAL specimens in patients 74 years of age and older), is one component of a comprehensive surveillance program. It is not intended to diagnose infection nor to guide or monitor treatment. Performed at [facility], [place] [address] [place], [place] [address]    Time coordinating discharge: 35 minutes  SIGNED:  Kerney Elbe, DO [facility] 04/09/2019, 11:52 AM Pager is on [place]  If 7PM-7AM, please contact night-coverage [email or web address] Password [email or web address]

## 2019-04-09 NOTE — Progress Notes (Signed)
Pt discharged today to home with family.  Pt's IV's removed.  Pt taken off telemetry and CCMD notified.  Pt left with all of their personal belongings.  AVS documentation reviewed with Pt and all questions answered.   

## 2019-04-09 NOTE — Progress Notes (Signed)
Occupational Therapy Treatment Patient Details Name: Juan Turner MRN: [id number] DOB: 1955/10/24 Today's Date: 04/09/2019    History of present illness 64 y.o. male, with past medical history of diabetes mellitus, hypertension, peripheral vascular disease, tobacco use, presents to ED c/o worsening right great toe discoloration and pain. 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 Session focus on functional mobility, toilet transfer and LB dressing. Overall, pt requires MINA for functional mobility greater than a household distance with RW needing cues for gait pattern. Pt needs most physical assist for sit<>stands as pt completed x3 sit<>stands with RW needing MIN - MOD A to power up and cues for hand placement during each trial. Initiated education on LB AE for bathing and dressing however pt would benefit from full demo next session as pt currently requires MAX A for LB ADLs. DC plan currently remains appropriate, will follow for OT needs.    Follow Up Recommendations  Home health OT    Equipment Recommendations  Tub/shower seat    Recommendations for Other Services      Precautions / Restrictions Precautions Precautions: Fall Precaution Comments: DARCO shoe when up Restrictions Weight Bearing Restrictions: No RLE Weight Bearing: Weight bearing as tolerated       Mobility Bed Mobility Overal bed mobility: Needs Assistance Bed Mobility: Supine to Sit     Supine to sit: Supervision;HOB elevated     General bed mobility comments: sup for safety, cues to scoot hips to EOB before sit<>stand; HOB elevated + use of rails  Transfers Overall transfer level: Needs assistance Equipment used: Rolling walker (2 wheeled) Transfers: Sit to/from Stand Sit to Stand: Min assist;From elevated  surface;Mod assist         General transfer comment: MIN A from elevated EOB; MOD A to power up from low toilet seat. pt completed x3 sit<>stands needing cues each trial for hand placement    Balance Overall balance assessment: Needs assistance Sitting-balance support: No upper extremity supported;Feet supported Sitting balance-Leahy Scale: Good     Standing balance support: Bilateral upper extremity supported;During functional activity Standing balance-Leahy Scale: Poor Standing balance comment: relies on external support                           ADL either performed or assessed with clinical judgement   ADL Overall ADL's : Needs assistance/impaired                 Upper Body Dressing : Minimal assistance;Sitting Upper Body Dressing Details (indicate cue type and reason): hospital gown as back side cover Lower Body Dressing: Maximal assistance;Sitting/lateral leans Lower Body Dressing Details (indicate cue type and reason): to don darco shoe on RLE Toilet Transfer: Minimal assistance;Ambulation;RW;Regular Toilet;Cueing for safety;Grab bars Toilet Transfer Details (indicate cue type and reason): pt completed functional mobility from EOB>toilet with MIN A needing cues for gait pattern. MIN A for balance and cues to slow down as pt becoming unstedy as urgency increased. MIN A to lower onto low toilet and power back with use of grab bars         Functional mobility during ADLs: Minimal assistance;Rolling walker;Cueing for safety;Cueing for sequencing General ADL Comments: pt limited by pain in RLE but able to make progress towards OT goals this session. session focus on toilet transfer, progressing  functional mobility distance and LB dressing     Vision       Perception     Praxis      Cognition Arousal/Alertness: Awake/alert Behavior During Therapy: WFL for tasks assessed/performed Overall Cognitive Status: Within Functional Limits for tasks assessed                                           Exercises     Shoulder Instructions       General Comments pt HR increase to 127 bpm RR 33    Pertinent Vitals/ Pain       Pain Assessment: Faces Faces Pain Scale: Hurts little more Pain Location: RLE incisions Pain Descriptors / Indicators: Aching;Burning;Discomfort;Nagging;Throbbing Pain Intervention(s): Monitored during session  Home Living                                          Prior Functioning/Environment              Frequency  Min 2X/week        Progress Toward Goals  OT Goals(current goals can now be found in the care plan section)  Progress towards OT goals: Progressing toward goals  Acute Rehab OT Goals Patient Stated Goal: to get home wtih family help OT Goal Formulation: With patient Time For Goal Achievement: 04/22/19 Potential to Achieve Goals: Good  Plan Discharge plan remains appropriate    Co-evaluation                 AM-PAC OT "6 Clicks" Daily Activity     Outcome Measure   Help from another person eating meals?: None Help from another person taking care of personal grooming?: A Little Help from another person toileting, which includes using toliet, bedpan, or urinal?: A Little Help from another person bathing (including washing, rinsing, drying)?: A Little Help from another person to put on and taking off regular upper body clothing?: A Little Help from another person to put on and taking off regular lower body clothing?: A Lot 6 Click Score: 18    End of Session Equipment Utilized During Treatment: Gait belt;Rolling walker;Other (comment)(darco shoe)  OT Visit Diagnosis: Unsteadiness on feet (R26.81);Pain Pain - Right/Left: Right Pain - part of body: Leg;Ankle and joints of foot   Activity Tolerance Patient tolerated treatment well   Patient Left in chair;with call bell/phone within reach;with family/visitor present   Nurse Communication  Mobility status        Time: 5329-9242 OT Time Calculation (min): 30 min  Charges: OT General Charges $OT Visit: 1 Visit OT Treatments $Self Care/Home Management : 8-22 mins $Therapeutic Activity: 8-22 mins  Audery Amel., COTA/L Acute Rehabilitation Services [phone]    Angelina Pih 04/09/2019, 3:35 PM

## 2019-04-09 NOTE — Progress Notes (Signed)
Subjective: POD #3 s/p right SFA to posterior tibial bypass with transmetatarsal amputation of the 1st and 2nd with vascular surgery. He states he is doing well but ready to go home. Denies any systemic complaints such as fevers, chills, nausea, vomiting.  Objective: AAO x3, NAD Bandage intact on the right foot.  At the distal aspect the left second toe is a hyperkeratotic lesion with a small eschar and callus formation.  There is no drainage or pus.  No fluctuation or crepitation there is no malodor.  Hyperpigmented changes to the toe. No other open lesions on the left side.  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 Antibiotics have been discontinued. Continue to keep the toe clean and dry, monitor closely for any signs of infection. Will have him follow up in clinic to monitor the 2nd toe on the left.   Ovid Curd, DPM O: [phone] C: [phone]

## 2019-04-09 NOTE — TOC Transition Note (Signed)
Transition of Care [facility]) - CM/SW Discharge Note   Patient Details  Name: Juan Turner MRN: [id number] Date of Birth: 18-Apr-1955  Transition [facility]) CM/SW Contact:  Deveron Furlong, RN 04/09/2019, 5:05 PM   Clinical Narrative:    Patient to d/c home with [facility] PT/OT/RN.  [facility] is unable to take patient referral due to staffing in .  No other HH agencies accept Medicaid.    D/W patient and wife.  They are agreeable to outpatient PT/OT at [facility].  Wife states she and a medically-trained family member can change dressings as needed.  Referral placed and information on AVS.   D/W Dr. Randie Heinz to clarify wound care instructions.  No wound care center referral needed.  Dressing is dry dressing with no specific instructions.  Gauze and ace wrap placed for protection.    RW and 3n1 delivered to room from [employer]. DME tub bench/shower stool not in stock in hospital at this time.  Patient given printed orders/facesheet for tub bench and shower stool to take to [facility] to obtain as needed.    Final next level of care: OP Rehab Barriers to Discharge: No Barriers Identified   Discharge Plan and Services                DME Arranged: 3-N-1, Walker rolling DME Agency: [employer]     Representative spoke with at DME Agency: delivered by CM

## 2019-04-14 ENCOUNTER — Other Ambulatory Visit: Payer: Self-pay

## 2019-04-14 ENCOUNTER — Ambulatory Visit: Payer: Medicaid Other | Admitting: Podiatry

## 2019-04-14 DIAGNOSIS — I739 Peripheral vascular disease, unspecified: Secondary | ICD-10-CM

## 2019-04-14 DIAGNOSIS — L97521 Non-pressure chronic ulcer of other part of left foot limited to breakdown of skin: Secondary | ICD-10-CM | POA: Diagnosis not present

## 2019-04-14 DIAGNOSIS — L02612 Cutaneous abscess of left foot: Secondary | ICD-10-CM

## 2019-04-14 MED ORDER — DOXYCYCLINE HYCLATE 100 MG PO TABS: 100.0000 mg | ORAL_TABLET | Freq: Two times a day (BID) | ORAL | 0 refills | Status: DC

## 2019-04-19 ENCOUNTER — Ambulatory Visit (HOSPITAL_COMMUNITY): Payer: Medicare Other | Admitting: Physical Therapy

## 2019-04-19 ENCOUNTER — Other Ambulatory Visit: Payer: Self-pay

## 2019-04-19 ENCOUNTER — Ambulatory Visit (HOSPITAL_COMMUNITY): Payer: Medicare Other | Attending: Vascular Surgery | Admitting: Occupational Therapy

## 2019-04-19 ENCOUNTER — Encounter (HOSPITAL_COMMUNITY): Payer: Self-pay | Admitting: Physical Therapy

## 2019-04-19 ENCOUNTER — Encounter (HOSPITAL_COMMUNITY): Payer: Self-pay | Admitting: Occupational Therapy

## 2019-04-19 DIAGNOSIS — R262 Difficulty in walking, not elsewhere classified: Secondary | ICD-10-CM | POA: Insufficient documentation

## 2019-04-19 DIAGNOSIS — R29898 Other symptoms and signs involving the musculoskeletal system: Secondary | ICD-10-CM | POA: Diagnosis present

## 2019-04-19 DIAGNOSIS — R2689 Other abnormalities of gait and mobility: Secondary | ICD-10-CM | POA: Diagnosis present

## 2019-04-19 NOTE — Therapy (Signed)
[facility] [address] [place], [place], [address] Phone: [phone]   Fax:  [phone]  Occupational Therapy Evaluation  Patient Details  Name: Juan Turner MRN: [id number] Date of Birth: [birth year] Referring Provider (OT): Dr. Lemar Livings   Encounter Date: 04/19/2019  OT End of Session - 04/19/19 1017    Visit Number  1    Number of Visits  1    Date for OT Re-Evaluation  04/22/19    Authorization Type  Medicaid    OT Start Time  0955    OT Stop Time  1024    OT Time Calculation (min)  29 min    Activity Tolerance  Patient tolerated treatment well    Behavior During Therapy  [facility] for tasks assessed/performed       Past Medical History:  Diagnosis Date  . Anxiety   . Diabetes mellitus   . Hypertension     Past Surgical History:  Procedure Laterality Date  . ABDOMINAL AORTOGRAM W/LOWER EXTREMITY N/A 04/05/2019   Procedure: ABDOMINAL AORTOGRAM W/LOWER EXTREMITY;  Surgeon: Sherren Kerns, MD;  Location: [facility] INVASIVE CV LAB;  Service: Cardiovascular;  Laterality: N/A;  . AMPUTATION Right 04/07/2019   Procedure: AMPUTATION DIGIT RIGHT FIRST AND SECOND TOES;  Surgeon: Maeola Harman, MD;  Location: [facility] OR;  Service: Vascular;  Laterality: Right;  . FEMORAL-TIBIAL BYPASS GRAFT Right 04/07/2019   Procedure: LEFT FEMORAL POSTERIOR TIBIAL BYPASS;  Surgeon: Maeola Harman, MD;  Location: [facility] OR;  Service: Vascular;  Laterality: Right;  . HERNIA REPAIR      There were no vitals filed for this visit.  Subjective Assessment - 04/19/19 1014    Subjective   S: I'm doing pretty well since I had the surgery.    Pertinent History  Pt is a 64 y/o male s/p right SFA to posterior tibial artery bypass with reversed translocated ipsilateral greater saphenous vein and transmetatarsal amputation right first and second toes on 04/07/2019. Pt was referred to occupational therapy for evaluation and treatment by Dr. Lemar Livings.     Patient Stated Goals  To be able to walk and balance better by myself.    Currently in Pain?  No/denies        [facility] OT Assessment - 04/19/19 0959      Assessment   Medical Diagnosis  vascular insufficiency of LE, PAD, gangrene    Referring Provider (OT)  Dr. Lemar Livings    Onset Date/Surgical Date  04/07/19    Hand Dominance  Right    Next MD Visit  04/29/2019    Prior Therapy  acute OT at [facility]      Precautions   Precautions  Fall    Precaution Comments  Transmetatarsal amputation right first and second toes      Balance Screen   Has the patient fallen in the past 6 months  No    Has the patient had a decrease in activity level because of a fear of falling?   No    Is the patient reluctant to leave their home because of a fear of falling?   No      Prior Function   Level of Independence  Independent    Vocation  On disability    Leisure  watching TV, working in the yard      ADL   ADL comments  Pt reports independence in ADLs-performs dressing and bathing himself. Pt is using shower chair for bathing. Sits  for dressing and grooming. Does not stand during tasks due to balance.       Written Expression   Dominant Hand  Right      Cognition   Overall Cognitive Status  Within Functional Limits for tasks assessed      ROM / Strength   AROM / PROM / Strength  Strength;AROM      AROM   Overall AROM Comments  A/ROM is WNL in all planes      Strength   Overall Strength Comments  Strength is WFL at 4+/5 in BUE                      OT Education - 04/19/19 1016    Education Details  Educated on sitting for ADL completion when balance is compromised.    Person(s) Educated  Patient    Methods  Explanation    Comprehension  Verbalized understanding                 Plan - 04/19/19 1017    Clinical Impression Statement  A: Pt is a 64 y/o male s/p right SFA to posterior tibial artery bypass with reversed translocated ipsilateral greater saphenous  vein and transmetatarsal amputation right first and second toes on 04/07/2019. Pt presents to evaluation using SPC, daughter-in-law assisting via holding upper arm. Pt reports independence in ADLs at home, performs in sitting due to balance difficulties. Pt reports son is at home with him, granddaughter visits daily, wife is at home after work. Family is able to provide assistance as neede for ADLs-such as set-up for seated tasks. Pt reports his only concern right now is his balance and walking and he is pleased with his ADL performance. Pt demonstrating how he performs seated ADLs during evaluation, no concerns with completion during OT observation.    OT Occupational Profile and History  Problem Focused Assessment - Including review of records relating to presenting problem    Occupational performance deficits (Please refer to evaluation for details):  ADL's;IADL's;Leisure    Rehab Potential  Good    Clinical Decision Making  Limited treatment options, no task modification necessary    Comorbidities Affecting Occupational Performance:  None    Modification or Assistance to Complete Evaluation   No modification of tasks or assist necessary to complete eval    OT Frequency  One time visit    OT Treatment/Interventions  Patient/family education    Plan  P: No further OT services required at this time, pt is primarily concerned about balance and strength, would prefer to use allotted Medicaid visits to improve upon these deficits.       Patient will benefit from skilled therapeutic intervention in order to improve the following deficits and impairments:           Visit Diagnosis: Other symptoms and signs involving the musculoskeletal system    Problem List Patient Active Problem List   Diagnosis Date Noted  . 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 Type 2 diabetes mellitus with foot ulcer ([place]) 04/04/2019  . Essential hypertension 04/04/2019  . Gangrene [facility]) 04/04/2019   Guadelupe Sabin, OTR/L   [phone] 04/19/2019, 10:25 AM  [place] [address] [place], [place], [address] Phone: [phone]   Fax:  [phone]  Name: Juan Turner MRN: [id number] Date of Birth: 1955-05-28

## 2019-04-19 NOTE — Therapy (Signed)
[facility] [address] [place], [place], [address] Phone: [phone]   Fax:  [phone]  Physical Therapy Treatment  Patient Details  Name: Juan Turner MRN: [id number] Date of Birth: [birth year] Referring Provider (PT): Maeola Harman   Encounter Date: 04/19/2019  PT End of Session - 04/19/19 1040    Visit Number  1    Number of Visits  12    Date for PT Re-Evaluation  05/31/19    Authorization Type  medicaid    Authorization Time Period  POC dates 04/19/19 to 05/31/19, requested 3 visits    Authorization - Visit Number  1    Authorization - Number of Visits  1    Progress Note Due on Visit  12    PT Start Time  1040    PT Stop Time  1125    PT Time Calculation (min)  45 min    Equipment Utilized During Treatment  Gait belt    Activity Tolerance  Patient tolerated treatment well    Behavior During Therapy  [facility] for tasks assessed/performed       Past Medical History:  Diagnosis Date  . Anxiety   . Diabetes mellitus   . Hypertension     Past Surgical History:  Procedure Laterality Date  . ABDOMINAL AORTOGRAM W/LOWER EXTREMITY N/A 04/05/2019   Procedure: ABDOMINAL AORTOGRAM W/LOWER EXTREMITY;  Surgeon: Sherren Kerns, MD;  Location: [facility] INVASIVE CV LAB;  Service: Cardiovascular;  Laterality: N/A;  . AMPUTATION Right 04/07/2019   Procedure: AMPUTATION DIGIT RIGHT FIRST AND SECOND TOES;  Surgeon: Maeola Harman, MD;  Location: [facility] OR;  Service: Vascular;  Laterality: Right;  . FEMORAL-TIBIAL BYPASS GRAFT Right 04/07/2019   Procedure: LEFT FEMORAL POSTERIOR TIBIAL BYPASS;  Surgeon: Maeola Harman, MD;  Location: [facility] OR;  Service: Vascular;  Laterality: Right;  . HERNIA REPAIR      There were no vitals filed for this visit.  Subjective Assessment - 04/19/19 1719    Subjective  Patient reports to therapy with complains of balance difficulties and difficulties walking after undergoing trans  metatarsal amputation of his right first and second toe. Minor discomfort in thigh noted where bypass was performed but otherwise he feels alright. Reports minor losses of balances but no falls as he is able to catch himself. Uses a cane in the community but a walker at home. Prior to surgery he was able to do everything independently with no assistive device or help required.    Pertinent History  DB, HTN, PVD    Limitations  Standing;Walking    How long can you sit comfortably?  no problem    How long can you stand comfortably?  a couple minutes    How long can you walk comfortably?  a couple minutes    Patient Stated Goals  to be able to walk    Currently in Pain?  No/denies         [facility] PT Assessment - 04/19/19 1034      Assessment   Medical Diagnosis  vascular insufficiency of LE, PAD, gangrene    Referring Provider (PT)  Maeola Harman    Onset Date/Surgical Date  04/07/19    Hand Dominance  Right    Next MD Visit  04/29/2019    Prior Therapy  acute PT at [facility]      Precautions   Precautions  Fall    Precaution Comments  Transmetatarsal amputation right first and second  toes      Restrictions   Weight Bearing Restrictions  Yes    RLE Weight Bearing  Weight bearing as tolerated    Other Position/Activity Restrictions  in [employer] shoe      Balance Screen   Has the patient fallen in the past 6 months  No    Has the patient had a decrease in activity level because of a fear of falling?   Yes    Is the patient reluctant to leave their home because of a fear of falling?   Yes      Home Environment   Living Environment  Private residence    Living Arrangements  Spouse/significant other    Available Help at Discharge  Family    Type of Home  House    Home Access  Level entry    Home Layout  One level    Home Equipment  Walker - 2 wheels;Cane - single point;Shower seat      Prior Function   Level of Independence  Independent    Vocation  On disability    Leisure   watching TV, working in the yard      Cognition   Overall Cognitive Status  Within Functional Limits for tasks assessed      Observation/Other Assessments   Observations  bandage on right foot noted at start of session. Removed dressing and incision presents with receding margins, stitches present but no longer closing wound. Slough, necrotic tissue and drainage with odor noted. Erythema and redness at incision site but this did note extend > 1 cm from wound bed.     Skin Integrity  poor, incision not healing well     Focus on Therapeutic Outcomes (FOTO)   n/a      Transfers   Transfers  Sit to Stand;Stand to Sit    Sit to Stand  5: Supervision;With upper extremity assist;With armrests    Sit to Stand Details  Verbal cues for precautions/safety    Stand to Sit  5: Supervision;With upper extremity assist;With armrests;Uncontrolled descent    Stand to Sit Details (indicate cue type and reason)  Verbal cues for precautions/safety      Ambulation/Gait   Ambulation/Gait  Yes    Ambulation/Gait Assistance  4: Min guard    Ambulation Distance (Feet)  30 Feet    Assistive device  Straight cane    Gait Pattern  Decreased stride length;Trunk flexed;Wide base of support    Gait velocity  decreased    Gait Comments  - 2 minor loss of balance with PT correction. fatigue after 2 minutes where patient required chair to rest                           PT Education - 04/19/19 1220    Education Details  Educated wife and patient on need for wound care and immediate follow up with surgeon. Answered all questions about woundcare and need for physician required treatment secondary to insurance. Educated patient in signs and symptoms of infection and to seek medical treatment if they present. Educated patient on using walker with ambualtion secondary to poor balance with cane.    Person(s) Educated  Patient;Spouse    Methods  Explanation    Comprehension  Verbalized understanding        PT Short Term Goals - 04/19/19 1553      PT SHORT TERM GOAL #1   Title  Patient will be  independent in HEP to improve functional outcomes.    Time  3    Period  Weeks    Status  New    Target Date  05/10/19      PT SHORT TERM GOAL #2   Title  Patient will be able to ambulate at least 226 feet with least restrictive assistive device to demonstrate improved ability to ambulate at home.    Baseline  3/9 30 feet with darco shoe and single point cane    Time  3    Period  Weeks    Status  New    Target Date  05/10/19        PT Long Term Goals - 04/19/19 1555      PT LONG TERM GOAL #1   Title  Patient will be able to transition from sit to stand from standard height chair without UE assistace to improve transitional mobility.    Time  6    Period  Weeks    Status  New    Target Date  05/31/19      PT LONG TERM GOAL #2   Title  Patient will be able to report no falls or almost falls over the last week to demonstrate improved dynamic and static balance at home.    Time  6    Period  Weeks    Status  New    Target Date  05/31/19      PT LONG TERM GOAL #3   Title  Patient will report at least 50% improvement in overall function to demonstrate improved functional mobility.    Time  6    Period  Weeks    Status  New    Target Date  05/31/19            Plan - 04/19/19 1351    Clinical Impression Statement  Patient s/p trans metatarsal amputation of his right first and second toe and complains of balance and walking difficulties since the surgery. On this date surgical site was priority in regards to education and contacting MD. Incision site presents with necrotic tissue, slough, mild drainage and stitches seemed to have failed at suturing wound together. Answered all questions and encouraged both patient and wife to follow up with MD. PT will also contact MD about wound bed findings to ensure patient follow up. Limited ability to ambulate and perform transitional tasks on  this date secondary to weakness, balance issues and requirements to wear DARCO shoe with weightbearing. Patient would benefit from skilled physical therapy to improve functional mobility and strength as well as decrease fall risk to reduce further risk of injury.    Personal Factors and Comorbidities  Age;Comorbidity 1;Comorbidity 2    Comorbidities  DB, HTN, PVD    Examination-Activity Limitations  Bathing;Squat;Stairs;Carry;Stand;Toileting;Transfers;Locomotion Level;Lift;Continence    Examination-Participation Restrictions  Cleaning;Shop;Driving;Yard Work;Laundry;Meal Prep;Community Activity    Stability/Clinical Decision Making  Stable/Uncomplicated    Clinical Decision Making  Low    Rehab Potential  Fair    PT Frequency  2x / week    PT Duration  6 weeks    PT Treatment/Interventions  ADLs/Self Care Home Management;Cryotherapy;Balance training;Therapeutic exercise;Therapeutic activities;Stair training;Gait training;DME Instruction;Neuromuscular re-education;Patient/family education;Manual techniques;Passive range of motion;Scar mobilization    PT Next Visit Plan  work on LE strengthening, balance - as able, walking with walking. f/u with patient about MD visit    PT Home Exercise Plan  will initiate next session.    Recommended Other Services  wound  care - f/u with surgeon about wound    Consulted and Agree with Plan of Care  Patient       Patient will benefit from skilled therapeutic intervention in order to improve the following deficits and impairments:  Abnormal gait, Decreased endurance, Decreased skin integrity, Decreased activity tolerance, Decreased balance, Decreased mobility, Difficulty walking, Decreased strength, Decreased knowledge of use of DME, Decreased range of motion, Improper body mechanics  Visit Diagnosis: Difficulty in walking, not elsewhere classified  Impairment of balance     Problem List Patient Active Problem List   Diagnosis Date Noted  . Dry gangrene  (HCC) 04/04/2019  . 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 Essential hypertension 04/04/2019  . Gangrene (HCC) 04/04/2019   5:32 PM, 04/19/19 Tereasa Coop, DPT Physical Therapy with [facility]  [phone] office  [facility] [address] [place], [place], [address] Phone: [phone]   Fax:  [phone]  Name: Juan Turner MRN: [id number] Date of Birth: [birth year]

## 2019-04-21 ENCOUNTER — Ambulatory Visit: Payer: Medicaid Other | Admitting: Podiatry

## 2019-04-21 ENCOUNTER — Other Ambulatory Visit: Payer: Self-pay

## 2019-04-21 ENCOUNTER — Encounter: Payer: Self-pay | Admitting: Podiatry

## 2019-04-21 VITALS — BP 150/93 | HR 83 | Temp 97.4°F | Resp 16

## 2019-04-21 DIAGNOSIS — I739 Peripheral vascular disease, unspecified: Secondary | ICD-10-CM

## 2019-04-21 DIAGNOSIS — L02612 Cutaneous abscess of left foot: Secondary | ICD-10-CM

## 2019-04-21 DIAGNOSIS — L97521 Non-pressure chronic ulcer of other part of left foot limited to breakdown of skin: Secondary | ICD-10-CM

## 2019-04-21 NOTE — Progress Notes (Signed)
Subjective: 64 year old male presents the office today for follow-up evaluation of infection of the left second toe, PAD.  He is on antibiotics and overall the toe is been looking better.  His family members been changing the bandage and has not seen any drainage or pus.  There continue to put saline wet to dry on the right foot.  They are following up with vascular surgery tomorrow. Denies any systemic complaints such as fevers, chills, nausea, vomiting. No acute changes since last appointment, and no other complaints at this time.   Objective: AAO x3, NAD Necrotic at the distal aspect left second toe.  Chronic swelling left second toe.  Today there is no purulence.  No erythema or warmth. Status post right partial first and second amputations and necrotic tissue with central dehiscence.  Not a clear drainage but there is no purulence.  No surrounding erythema, ascending cellulitis. No open lesions or pre-ulcerative lesions.  No pain with calf compression, swelling, warmth, erythema        Assessment: PAD, bilateral ulcerations   Plan: -All treatment options discussed with the patient including all alternatives, risks, complications.  -Lightly debrided some of the hyperkeratotic tissue on the left second toe without any complications or bleeding.  Recommend switch to using Iodosorb daily which was dispensed.  Finish course of antibiotics.  Monitoring signs or symptoms of infection. -Following up with vascular surgery tomorrow.  Continue dressings under their direction. -Patient encouraged to call the office with any questions, concerns, change in symptoms.   Vivi Barrack DPM

## 2019-04-21 NOTE — Progress Notes (Addendum)
Subjective: 64 year old male presents the office today for follow-up evaluation of a wound to the left second toe.  He recently underwent bypass procedure in the right lower extremity with transmetatarsal imitation the first and second rays by vascular surgery.  Family member has been changing the bandage daily.  Left second toe appears to be stable denies any drainage or pus but he has noticed the callus at the tip of the toe.  Denies any systemic complaints such as fevers, chills, nausea, vomiting. No acute changes since last appointment, and no other complaints at this time.   Objective: AAO x3, NAD Thick hyperkeratotic tissue the distal aspect left second toe and upon debridement there was purulence identified there is no probing to bone, undermining or tunneling.  Necrotic area which has been stable the distal aspect the toe.  Chronic swelling to the second toe. Previous amputation of the right first and second rays.  Necrotic tissue is present in the central aspect and some macerated tissue.  There is no surrounding erythema, ascending cellulitis.  There is no fluctuation or crepitation.  There is no malodor. No open lesions or pre-ulcerative lesions.  No pain with calf compression, swelling, warmth, erythema         Assessment: Left second toe ulceration, PAD; recent bypass, amputation right foot per vascular surgery . Plan: -All treatment options discussed with the patient including all alternatives, risks, complications.  -Debrided the hyperkeratotic tissue left second toe without any complications however there was underlying purulence identified.  I cleaned the wound.  Prescribed doxycycline.  Continue small amounts of antibiotic ointment dressing changes daily. -Continue wound dressings on the right foot per vascular surgery -Patient encouraged to call the office with any questions, concerns, change in symptoms.   Return in about 1 week (around 04/21/2019).  Vivi Barrack  DPM

## 2019-04-22 ENCOUNTER — Other Ambulatory Visit: Payer: Self-pay

## 2019-04-22 ENCOUNTER — Ambulatory Visit (INDEPENDENT_AMBULATORY_CARE_PROVIDER_SITE_OTHER): Payer: Self-pay | Admitting: Physician Assistant

## 2019-04-22 VITALS — BP 131/81 | HR 66 | Temp 96.0°F | Resp 16 | Ht 72.0 in | Wt 153.0 lb

## 2019-04-22 DIAGNOSIS — D509 Iron deficiency anemia, unspecified: Secondary | ICD-10-CM

## 2019-04-22 DIAGNOSIS — Z95828 Presence of other vascular implants and grafts: Secondary | ICD-10-CM

## 2019-04-22 MED ORDER — POLYSACCHARIDE IRON COMPLEX 150 MG PO CAPS: 150.0000 mg | ORAL_CAPSULE | Freq: Every day | ORAL | 6 refills | Status: DC

## 2019-04-22 NOTE — Progress Notes (Signed)
POST OPERATIVE OFFICE NOTE    CC:  F/u for surgery  HPI:  This is a 64 y.o. male who is s/p right SFA to PT bypass with reversed translocated ipsilateral GSV and TMA of the 1st and 2nd toes.  He had a brisk PT doppler signal.  DP and peroneal signals also present.  He was ambulating well and d/c'd on POD 2 with plans to f/u in 2-3 weeks for suture removal.    He is followed by Dr. Jacqualyn Posey for left 2nd toe.  He was seen yesterday and he lightly debrided some of the hyperkeratotic tissue on the left 2nd toe without complications or bleeding.    He is here today for follow up.  He states he is home and doing well.  He states that Dr. Jacqualyn Posey is watching his left 2nd toe.      No Known Allergies  Current Outpatient Medications  Medication Sig Dispense Refill  . amLODipine-benazepril (LOTREL) 5-10 MG per capsule Take 1 capsule by mouth daily.    Marland Kitchen aspirin EC 81 MG EC tablet Take 1 tablet (81 mg total) by mouth daily. 30 tablet 0  . blood glucose meter kit and supplies KIT Dispense based on patient and insurance preference. Use up to four times daily as directed. (FOR ICD-9 250.00, 250.01). 1 each 0  . docusate sodium (COLACE) 100 MG capsule Take 1 capsule (100 mg total) by mouth daily. 10 capsule 0  . doxycycline (VIBRA-TABS) 100 MG tablet Take 1 tablet (100 mg total) by mouth 2 (two) times daily. 20 tablet 0  . gabapentin (NEURONTIN) 100 MG capsule Take 1 capsule (100 mg total) by mouth at bedtime. (Patient not taking: Reported on 04/04/2019) 30 capsule 0  . glipiZIDE (GLUCOTROL) 5 MG tablet Take 1 tablet (5 mg total) by mouth daily before breakfast. 30 tablet 0  . hydrocerin (EUCERIN) CREA Apply 1 application topically 2 (two) times daily. 113 g 0  . iron polysaccharides (NIFEREX) 150 MG capsule Take 1 capsule (150 mg total) by mouth daily. 30 capsule 0  . metFORMIN (GLUCOPHAGE) 1000 MG tablet Take 1 tablet (1,000 mg total) by mouth 2 (two) times daily with a meal. 60 tablet 0  . nicotine  (NICODERM CQ - DOSED IN MG/24 HOURS) 21 mg/24hr patch Place 1 patch (21 mg total) onto the skin daily. 28 patch 0  . oxyCODONE (OXY IR/ROXICODONE) 5 MG immediate release tablet Take 1-2 tablets (5-10 mg total) by mouth every 4 (four) hours as needed for moderate pain. 10 tablet 0  . pantoprazole (PROTONIX) 40 MG tablet Take 1 tablet (40 mg total) by mouth daily. 30 tablet 0  . rosuvastatin (CRESTOR) 10 MG tablet Take 1 tablet (10 mg total) by mouth daily at 6 PM. 30 tablet 0   No current facility-administered medications for this visit.     ROS:  See HPI  Physical Exam:  Today's Vitals   04/22/19 1525  BP: 131/81  Pulse: 66  Resp: 16  Temp: (!) 96 F (35.6 C)  TempSrc: Temporal  SpO2: 96%  Weight: 153 lb (69.4 kg)  Height: 6' (1.829 m)   Body mass index is 20.75 kg/m.   Incision:      Extremities:  Brisk doppler signals right DP/PT/peroneal; the right DP and PT are palpable. Left DP doppler signal present.  Left foot cooler than the right.   Assessment/Plan:  This is a 64 y.o. male who is s/p:  right SFA to PT bypass with reversed translocated  ipsilateral GSV and TMA of the 1st and 2nd toes by Dr. Donzetta Matters on 04/07/2019  -pt right TMA of 1st and 2nd toes are not healing.  Pt examined by Dr. Donzetta Matters and he is recommending right TMA.  I did call and discuss with pt's daughter in law, Tobi and questions answered.   -wet to dry dressing placed on wound.  Did not remove sutures since he will be having surgery next week.  -Dr. Jacqualyn Posey managing left 2nd toe wound.  Pt will eventually need LLE bypass once right foot has healed.  This also discussed with daughter in law.    Leontine Locket, PA-C [facility] [phone]  Clinic MD:  Pt seen and examined with Dr. Donzetta Matters

## 2019-04-26 ENCOUNTER — Other Ambulatory Visit: Payer: Self-pay

## 2019-04-26 ENCOUNTER — Encounter (HOSPITAL_COMMUNITY): Payer: Self-pay | Admitting: Physical Therapy

## 2019-04-26 ENCOUNTER — Other Ambulatory Visit (HOSPITAL_COMMUNITY)
Admission: RE | Admit: 2019-04-26 | Discharge: 2019-04-26 | Disposition: A | Discharge status: Home / Self Care | Payer: Medicare Other | Source: Ambulatory Visit | Attending: Vascular Surgery | Admitting: Vascular Surgery

## 2019-04-26 ENCOUNTER — Ambulatory Visit (HOSPITAL_COMMUNITY): Payer: Medicare Other | Admitting: Physical Therapy

## 2019-04-26 ENCOUNTER — Other Ambulatory Visit (HOSPITAL_COMMUNITY): Admission: RE | Admit: 2019-04-26 | Payer: Medicare Other | Source: Ambulatory Visit

## 2019-04-26 DIAGNOSIS — Z01812 Encounter for preprocedural laboratory examination: Secondary | ICD-10-CM | POA: Insufficient documentation

## 2019-04-26 DIAGNOSIS — R29898 Other symptoms and signs involving the musculoskeletal system: Secondary | ICD-10-CM

## 2019-04-26 DIAGNOSIS — R262 Difficulty in walking, not elsewhere classified: Secondary | ICD-10-CM

## 2019-04-26 DIAGNOSIS — Z20822 Contact with and (suspected) exposure to covid-19: Secondary | ICD-10-CM | POA: Insufficient documentation

## 2019-04-26 DIAGNOSIS — R2689 Other abnormalities of gait and mobility: Secondary | ICD-10-CM

## 2019-04-26 LAB — SARS CORONAVIRUS 2 (TAT 6-24 HRS): SARS Coronavirus 2: NEGATIVE

## 2019-04-26 NOTE — Therapy (Addendum)
[facility] [address] [place], [place], [address] Phone: [phone]   Fax:  [phone]  Physical Therapy Treatment and Discharge Note  Patient Details  Name: Juan Turner MRN: [id number] Date of Birth: 26-Mar-1955 Referring Provider (PT): [place] THERAPY DISCHARGE SUMMARY  Visits from Start of Care: 2  Current functional level related to goals / functional outcomes: Unable to assess due to unplanned discharge   Remaining deficits: Unable to assess due to unplanned discharge   Education / Equipment: Unable to assess due to unplanned discharge Plan: Patient agrees to discharge.  Patient goals were not met. Patient is being discharged due to                                                     ?????         4:14 PM, 11/29/19 Jerene Pitch, DPT Physical Therapy with [facility]  [phone] office   Encounter Date: 04/26/2019  PT End of Session - 04/26/19 1044    Visit Number  2    Number of Visits  12    Date for PT Re-Evaluation  05/31/19    Authorization Type  medicaid    Authorization Time Period  POC dates 04/19/19 to 05/31/19, 3 visits approved from 04/21/19 to 05/04/19    Authorization - Visit Number  1    Authorization - Number of Visits  3    Progress Note Due on Visit  12    PT Start Time  3244    PT Stop Time  1125    PT Time Calculation (min)  40 min    Equipment Utilized During Treatment  Gait belt    Activity Tolerance  Patient tolerated treatment well    Behavior During Therapy  WFL for tasks assessed/performed       Past Medical History:  Diagnosis Date  . Anxiety   . Diabetes mellitus   . Hypertension     Past Surgical History:  Procedure Laterality Date  . ABDOMINAL AORTOGRAM W/LOWER EXTREMITY N/A 04/05/2019   Procedure: ABDOMINAL AORTOGRAM W/LOWER EXTREMITY;  Surgeon: Elam Dutch, MD;  Location: [place];  Service:  Cardiovascular;  Laterality: N/A;  . AMPUTATION Right 04/07/2019   Procedure: AMPUTATION DIGIT RIGHT FIRST AND SECOND TOES;  Surgeon: Waynetta Sandy, MD;  Location: [facility];  Service: Vascular;  Laterality: Right;  . FEMORAL-TIBIAL BYPASS GRAFT Right 04/07/2019   Procedure: LEFT FEMORAL POSTERIOR TIBIAL BYPASS;  Surgeon: Waynetta Sandy, MD;  Location: [facility];  Service: Vascular;  Laterality: Right;  . HERNIA REPAIR      There were no vitals filed for this visit.  Subjective Assessment - 04/26/19 1048    Subjective  STates that surgery scheduled thursday for transmet amputation and he doesn't want to stay at the hospital. States he doesn't have any pain and he has been using the walker which seems to be helping a lot. Reports he is worried he is going to lose his whole foot.    Pertinent History  DB, HTN, PVD    Limitations  Standing;Walking    How long can you sit comfortably?  no problem    How long can you stand comfortably?  a couple minutes    How long can you walk  comfortably?  a couple minutes    Patient Stated Goals  to be able to walk         [facility] - 04/26/19 0001      Assessment   Medical Diagnosis  vascular insufficiency of LE, PAD, gangrene    Referring Provider (PT)  Waynetta Sandy    Onset Date/Surgical Date  04/07/19    Hand Dominance  Right    Next MD Visit  04/29/2019    Prior Therapy  acute PT at [facility]/Exercise - 04/26/19 0001      Ambulation/Gait   Ambulation/Gait  Yes    Ambulation/Gait Assistance  6: Modified independent (Device/Increase time)    Ambulation Distance (Feet)  302 Feet    Assistive device  Rolling walker    Gait Pattern  Decreased stride length;Trunk flexed;Wide base of support    Gait velocity  decreased    Gait Comments  3:48      Neuro Re-ed    Neuro Re-ed Details   balance exercises: narrow BOS 2x30" --> wiht cervical rotation 5x5 B - slow  turns SBA       Exercises   Exercises  Knee/Hip      Knee/Hip Exercises: Standing   Hip Abduction  AROM;Stengthening;Both;4 sets;5 reps   2 UE assist    Other Standing Knee Exercises  hamstring curls 5x5, 2UE assist B              PT Education - 04/26/19 1244    Education Details  Patient educated in upcoming surgery, typical and atypical wound healing. Balance exercises and how they will benefit patient.    Person(s) Educated  Patient    Methods  Explanation    Comprehension  Verbalized understanding       PT Short Term Goals - 04/26/19 1252      PT SHORT TERM GOAL #1   Title  Patient will be independent in HEP to improve functional outcomes.    Time  3    Period  Weeks    Status  New    Target Date  05/10/19      PT SHORT TERM GOAL #2   Title  Patient will be able to ambulate at least 226 feet with least restrictive assistive device to demonstrate improved ability to ambulate at home.    Time  3    Period  Weeks    Status  Achieved    Target Date  05/10/19        PT Long Term Goals - 04/19/19 1555      PT LONG TERM GOAL #1   Title  Patient will be able to transition from sit to stand from standard height chair without UE assistace to improve transitional mobility.    Time  6    Period  Weeks    Status  New    Target Date  05/31/19      PT LONG TERM GOAL #2   Title  Patient will be able to report no falls or almost falls over the last week to demonstrate improved dynamic and static balance at home.    Time  6    Period  Weeks    Status  New    Target Date  05/31/19      PT LONG TERM GOAL #3   Title  Patient will report  at least 50% improvement in overall function to demonstrate improved functional mobility.    Time  6    Period  Weeks    Status  New    Target Date  05/31/19            Plan - 04/26/19 1250    Clinical Impression Statement  Patient demonstrated improved walking with RW today. Able to practice balance and strengthening  exercises without pain or difficulty. Patient met walking goal on this date. Next visit cancelled secondary to upcoming foot surgery. Will follow up with patient next session about upcoming surgery. Patient would continue to benefit from skilled physical therapy.    Personal Factors and Comorbidities  Age;Comorbidity 1;Comorbidity 2    Comorbidities  DB, HTN, PVD    Examination-Activity Limitations  Bathing;Squat;Stairs;Carry;Stand;Toileting;Transfers;Locomotion Level;Lift;Continence    Examination-Participation Restrictions  Cleaning;Shop;Driving;Yard Work;Laundry;Meal Prep;Community Activity    Stability/Clinical Decision Making  Evolving/Moderate complexity    Rehab Potential  Fair    PT Frequency  2x / week    PT Duration  6 weeks    PT Treatment/Interventions  ADLs/Self Care Home Management;Cryotherapy;Balance training;Therapeutic exercise;Therapeutic activities;Stair training;Gait training;DME Instruction;Neuromuscular re-education;Patient/family education;Manual techniques;Passive range of motion;Scar mobilization    PT Next Visit Plan  work on LE strengthening, balance - as able, walking with walking. f/u with patient about MD visit    PT Home Exercise Plan  3/16 standing hamstring curls    Consulted and Agree with Plan of Care  Patient       Patient will benefit from skilled therapeutic intervention in order to improve the following deficits and impairments:  Abnormal gait, Decreased endurance, Decreased skin integrity, Decreased activity tolerance, Decreased balance, Decreased mobility, Difficulty walking, Decreased strength, Decreased knowledge of use of DME, Decreased range of motion, Improper body mechanics  Visit Diagnosis: Difficulty in walking, not elsewhere classified  Impairment of balance  Other symptoms and signs involving the musculoskeletal system     Problem List Patient Active Problem List   Diagnosis Date Noted  . 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 Type 2  diabetes mellitus with foot ulcer ([place]) 04/04/2019  . Essential hypertension 04/04/2019  . Gangrene ([place]) 04/04/2019   12:53 PM, 04/26/19 Jerene Pitch, DPT Physical Therapy with [facility]  [phone] office  [facility] [address] [place], [place], [address] Phone: [phone]   Fax:  [phone]  Name: MARLEE TRENTMAN MRN: [id number] Date of Birth: May 30, 1955

## 2019-04-27 ENCOUNTER — Other Ambulatory Visit: Payer: Self-pay

## 2019-04-27 ENCOUNTER — Encounter (HOSPITAL_COMMUNITY): Payer: Self-pay | Admitting: Vascular Surgery

## 2019-04-27 ENCOUNTER — Telehealth: Payer: Self-pay

## 2019-04-27 NOTE — Progress Notes (Signed)
Pt denies SOB, chest pain, and being under the care of a cardiologist and PCP. Pt denies having a stress test, echo and cardiac cath. Pt denies having a chest x ray in the last year. Pt denies recent labs since discharge. Pt made aware to stop taking vitamins, fish oil and herbal medications. Do not take any NSAIDs ie: Ibuprofen, Advil, Naproxen (Aleve), Motrin, BC and Goody Powder. Pt reminded to quarantine. Pt requested that nurse call back and give pre-op instructions to spouse, Mrs. Hoel. Spouse made aware to have pt hold Glipizide and Metformin on DOS. Spouse stated that pt fasting CBG ranges from 130-140. Spouse made aware to have pt check CBG every 2 hours prior to arrival to hospital on DOS. Spouse made aware to have pt treat a CBG < 70 with 4 ounces of apple or cranberry juice, wait 15 minutes after intervention to recheck CBG, if CBG remains < 70, call Short Stay unit to speak with a nurse. Both pt and pt spouse Mrs. Righter, verbalized understanding of all pre-op instructions.

## 2019-04-28 ENCOUNTER — Inpatient Hospital Stay (HOSPITAL_COMMUNITY)
Admission: RE | Admit: 2019-04-28 | Discharge: 2019-04-30 | DRG: 241 | Disposition: A | Discharge status: Home / Self Care | Payer: Medicare Other | Attending: Vascular Surgery | Admitting: Vascular Surgery

## 2019-04-28 ENCOUNTER — Inpatient Hospital Stay (HOSPITAL_COMMUNITY): Payer: Medicare Other | Admitting: Certified Registered Nurse Anesthetist

## 2019-04-28 ENCOUNTER — Encounter (HOSPITAL_COMMUNITY)
Admission: RE | Disposition: A | Discharge status: Home / Self Care | Payer: Self-pay | Source: Home / Self Care | Attending: Vascular Surgery

## 2019-04-28 ENCOUNTER — Encounter (HOSPITAL_COMMUNITY): Payer: Self-pay | Admitting: Vascular Surgery

## 2019-04-28 ENCOUNTER — Other Ambulatory Visit: Payer: Self-pay

## 2019-04-28 ENCOUNTER — Ambulatory Visit (HOSPITAL_COMMUNITY): Payer: Medicare Other | Admitting: Physical Therapy

## 2019-04-28 DIAGNOSIS — Z89421 Acquired absence of other right toe(s): Secondary | ICD-10-CM

## 2019-04-28 DIAGNOSIS — E1152 Type 2 diabetes mellitus with diabetic peripheral angiopathy with gangrene: Secondary | ICD-10-CM | POA: Diagnosis not present

## 2019-04-28 DIAGNOSIS — Z89411 Acquired absence of right great toe: Secondary | ICD-10-CM | POA: Diagnosis not present

## 2019-04-28 DIAGNOSIS — T8789 Other complications of amputation stump: Secondary | ICD-10-CM | POA: Diagnosis not present

## 2019-04-28 DIAGNOSIS — E1151 Type 2 diabetes mellitus with diabetic peripheral angiopathy without gangrene: Secondary | ICD-10-CM | POA: Diagnosis present

## 2019-04-28 DIAGNOSIS — Z79899 Other long term (current) drug therapy: Secondary | ICD-10-CM

## 2019-04-28 DIAGNOSIS — I739 Peripheral vascular disease, unspecified: Secondary | ICD-10-CM | POA: Diagnosis present

## 2019-04-28 DIAGNOSIS — Z7984 Long term (current) use of oral hypoglycemic drugs: Secondary | ICD-10-CM | POA: Diagnosis not present

## 2019-04-28 DIAGNOSIS — I10 Essential (primary) hypertension: Secondary | ICD-10-CM | POA: Diagnosis present

## 2019-04-28 DIAGNOSIS — Z7982 Long term (current) use of aspirin: Secondary | ICD-10-CM | POA: Diagnosis not present

## 2019-04-28 HISTORY — PX: TRANSMETATARSAL AMPUTATION: SHX6197

## 2019-04-28 LAB — CBC
HCT: 36 % — ABNORMAL LOW (ref 39.0–52.0)
Hemoglobin: 11.3 g/dL — ABNORMAL LOW (ref 13.0–17.0)
MCH: 26.7 pg (ref 26.0–34.0)
MCHC: 31.4 g/dL (ref 30.0–36.0)
MCV: 84.9 fL (ref 80.0–100.0)
Platelets: 286 10*3/uL (ref 150–400)
RBC: 4.24 MIL/uL (ref 4.22–5.81)
RDW: 12.6 % (ref 11.5–15.5)
WBC: 5.8 10*3/uL (ref 4.0–10.5)
nRBC: 0 % (ref 0.0–0.2)

## 2019-04-28 LAB — COMPREHENSIVE METABOLIC PANEL
ALT: 17 U/L (ref 0–44)
AST: 22 U/L (ref 15–41)
Albumin: 3.3 g/dL — ABNORMAL LOW (ref 3.5–5.0)
Alkaline Phosphatase: 95 U/L (ref 38–126)
Anion gap: 9 (ref 5–15)
BUN: 6 mg/dL — ABNORMAL LOW (ref 8–23)
CO2: 26 mmol/L (ref 22–32)
Calcium: 9.2 mg/dL (ref 8.9–10.3)
Chloride: 101 mmol/L (ref 98–111)
Creatinine, Ser: 0.82 mg/dL (ref 0.61–1.24)
GFR calc Af Amer: >60 mL/min (ref >60)
GFR calc non Af Amer: >60 mL/min (ref >60)
Glucose, Bld: 134 mg/dL — ABNORMAL HIGH (ref 70–99)
Potassium: 4.6 mmol/L (ref 3.5–5.1)
Sodium: 136 mmol/L (ref 135–145)
Total Bilirubin: 0.5 mg/dL (ref 0.3–1.2)
Total Protein: 7.4 g/dL (ref 6.5–8.1)

## 2019-04-28 LAB — URINALYSIS, ROUTINE W REFLEX MICROSCOPIC
Bilirubin Urine: NEGATIVE
Glucose, UA: NEGATIVE mg/dL
Hgb urine dipstick: NEGATIVE
Ketones, ur: NEGATIVE mg/dL
Leukocytes,Ua: NEGATIVE
Nitrite: NEGATIVE
Protein, ur: NEGATIVE mg/dL
Specific Gravity, Urine: 1.015 (ref 1.005–1.030)
pH: 7 (ref 5.0–8.0)

## 2019-04-28 LAB — PROTIME-INR
INR: 1 (ref 0.8–1.2)
Prothrombin Time: 12.9 seconds (ref 11.4–15.2)

## 2019-04-28 LAB — TYPE AND SCREEN
ABO/RH(D): B POS
Antibody Screen: NEGATIVE

## 2019-04-28 LAB — ABO/RH: ABO/RH(D): B POS

## 2019-04-28 LAB — GLUCOSE, CAPILLARY
Glucose-Capillary: 126 mg/dL — ABNORMAL HIGH (ref 70–99)
Glucose-Capillary: 140 mg/dL — ABNORMAL HIGH (ref 70–99)
Glucose-Capillary: 228 mg/dL — ABNORMAL HIGH (ref 70–99)
Glucose-Capillary: 177 mg/dL — ABNORMAL HIGH (ref 70–99)

## 2019-04-28 LAB — APTT: aPTT: 35 seconds (ref 24–36)

## 2019-04-28 SURGERY — AMPUTATION, FOOT, TRANSMETATARSAL
Anesthesia: Monitor Anesthesia Care | Site: Toe | Laterality: Right

## 2019-04-28 MED ORDER — ONDANSETRON HCL 4 MG/2ML IJ SOLN
INTRAMUSCULAR | Status: AC
  Filled 2019-04-28: qty 2

## 2019-04-28 MED ORDER — NICOTINE 21 MG/24HR TD PT24
21.0000 mg | MEDICATED_PATCH | Freq: Every day | TRANSDERMAL | Status: DC
  Administered 2019-04-28 – 2019-04-30 (×3): 21 mg via TRANSDERMAL
  Filled 2019-04-28 (×3): qty 1

## 2019-04-28 MED ORDER — GUAIFENESIN-DM 100-10 MG/5ML PO SYRP: 15.0000 mL | ORAL_SOLUTION | ORAL | Status: DC | PRN

## 2019-04-28 MED ORDER — ONDANSETRON HCL 4 MG/2ML IJ SOLN
4.0000 mg | Freq: Four times a day (QID) | INTRAMUSCULAR | Status: DC | PRN
  Administered 2019-04-29: 4 mg via INTRAVENOUS
  Filled 2019-04-28: qty 2

## 2019-04-28 MED ORDER — LACTATED RINGERS IV SOLN: INTRAVENOUS | Status: DC

## 2019-04-28 MED ORDER — INSULIN ASPART 100 UNIT/ML ~~LOC~~ SOLN
0.0000 [IU] | Freq: Three times a day (TID) | SUBCUTANEOUS | Status: DC
  Administered 2019-04-28: 5 [IU] via SUBCUTANEOUS
  Administered 2019-04-29: 3 [IU] via SUBCUTANEOUS
  Administered 2019-04-29: 2 [IU] via SUBCUTANEOUS
  Administered 2019-04-29 – 2019-04-30 (×2): 3 [IU] via SUBCUTANEOUS

## 2019-04-28 MED ORDER — ASPIRIN EC 81 MG PO TBEC
81.0000 mg | DELAYED_RELEASE_TABLET | Freq: Every day | ORAL | Status: DC
  Administered 2019-04-29 – 2019-04-30 (×2): 81 mg via ORAL
  Filled 2019-04-28 (×2): qty 1

## 2019-04-28 MED ORDER — LIDOCAINE HCL (PF) 1 % IJ SOLN
INTRAMUSCULAR | Status: DC | PRN
  Administered 2019-04-28: 30 mL

## 2019-04-28 MED ORDER — OXYCODONE HCL 5 MG PO TABS
5.0000 mg | ORAL_TABLET | ORAL | Status: DC | PRN
  Administered 2019-04-28: 5 mg via ORAL
  Administered 2019-04-29: 10 mg via ORAL
  Filled 2019-04-28 (×2): qty 2
  Filled 2019-04-28: qty 1

## 2019-04-28 MED ORDER — ACETAMINOPHEN 10 MG/ML IV SOLN: 1000.0000 mg | Freq: Once | INTRAVENOUS | Status: DC | PRN

## 2019-04-28 MED ORDER — OXYCODONE HCL 5 MG PO TABS: 5.0000 mg | ORAL_TABLET | Freq: Once | ORAL | Status: DC | PRN

## 2019-04-28 MED ORDER — PHENYLEPHRINE 40 MCG/ML (10ML) SYRINGE FOR IV PUSH (FOR BLOOD PRESSURE SUPPORT)
PREFILLED_SYRINGE | INTRAVENOUS | Status: AC
  Filled 2019-04-28: qty 10

## 2019-04-28 MED ORDER — HYDRALAZINE HCL 20 MG/ML IJ SOLN: 5.0000 mg | INTRAMUSCULAR | Status: DC | PRN

## 2019-04-28 MED ORDER — CEFAZOLIN SODIUM-DEXTROSE 2-4 GM/100ML-% IV SOLN
2.0000 g | INTRAVENOUS | Status: AC
  Administered 2019-04-28: 2 g via INTRAVENOUS

## 2019-04-28 MED ORDER — FENTANYL CITRATE (PF) 100 MCG/2ML IJ SOLN
INTRAMUSCULAR | Status: AC
  Administered 2019-04-28: 50 ug via INTRAVENOUS
  Filled 2019-04-28: qty 2

## 2019-04-28 MED ORDER — AMLODIPINE BESYLATE 5 MG PO TABS
5.0000 mg | ORAL_TABLET | Freq: Every day | ORAL | Status: DC
  Administered 2019-04-28 – 2019-04-30 (×3): 5 mg via ORAL
  Filled 2019-04-28 (×3): qty 1

## 2019-04-28 MED ORDER — ONDANSETRON HCL 4 MG/2ML IJ SOLN
INTRAMUSCULAR | Status: DC | PRN
  Administered 2019-04-28: 4 mg via INTRAVENOUS

## 2019-04-28 MED ORDER — HEPARIN SODIUM (PORCINE) 5000 UNIT/ML IJ SOLN
5000.0000 [IU] | Freq: Three times a day (TID) | INTRAMUSCULAR | Status: DC
  Administered 2019-04-29 – 2019-04-30 (×4): 5000 [IU] via SUBCUTANEOUS
  Filled 2019-04-28 (×4): qty 1

## 2019-04-28 MED ORDER — ACETAMINOPHEN 160 MG/5ML PO SOLN: 1000.0000 mg | Freq: Once | ORAL | Status: DC | PRN

## 2019-04-28 MED ORDER — CHLORHEXIDINE GLUCONATE 4 % EX LIQD: 60.0000 mL | Freq: Once | CUTANEOUS | Status: DC

## 2019-04-28 MED ORDER — INSULIN ASPART 100 UNIT/ML ~~LOC~~ SOLN: 0.0000 [IU] | Freq: Every day | SUBCUTANEOUS | Status: DC

## 2019-04-28 MED ORDER — SODIUM CHLORIDE 0.9 % IV SOLN: INTRAVENOUS | Status: DC

## 2019-04-28 MED ORDER — POTASSIUM CHLORIDE CRYS ER 20 MEQ PO TBCR
20.0000 meq | EXTENDED_RELEASE_TABLET | Freq: Once | ORAL | Status: AC
  Administered 2019-04-28: 40 meq via ORAL
  Filled 2019-04-28: qty 2

## 2019-04-28 MED ORDER — ROSUVASTATIN CALCIUM 5 MG PO TABS
10.0000 mg | ORAL_TABLET | Freq: Every day | ORAL | Status: DC
  Administered 2019-04-28 – 2019-04-29 (×2): 10 mg via ORAL
  Filled 2019-04-28 (×2): qty 2

## 2019-04-28 MED ORDER — METOPROLOL TARTRATE 5 MG/5ML IV SOLN: 2.0000 mg | INTRAVENOUS | Status: DC | PRN

## 2019-04-28 MED ORDER — FENTANYL CITRATE (PF) 250 MCG/5ML IJ SOLN
INTRAMUSCULAR | Status: AC
  Filled 2019-04-28: qty 5

## 2019-04-28 MED ORDER — FENTANYL CITRATE (PF) 100 MCG/2ML IJ SOLN: 25.0000 ug | INTRAMUSCULAR | Status: DC | PRN

## 2019-04-28 MED ORDER — GABAPENTIN 100 MG PO CAPS
100.0000 mg | ORAL_CAPSULE | Freq: Every day | ORAL | Status: DC
  Administered 2019-04-28 – 2019-04-29 (×2): 100 mg via ORAL
  Filled 2019-04-28 (×2): qty 1

## 2019-04-28 MED ORDER — FENTANYL CITRATE (PF) 100 MCG/2ML IJ SOLN
100.0000 ug | Freq: Once | INTRAMUSCULAR | Status: AC
  Administered 2019-04-28: 50 ug via INTRAVENOUS

## 2019-04-28 MED ORDER — CEFAZOLIN SODIUM-DEXTROSE 2-4 GM/100ML-% IV SOLN
INTRAVENOUS | Status: AC
  Filled 2019-04-28: qty 100

## 2019-04-28 MED ORDER — PROPOFOL 10 MG/ML IV BOLUS
INTRAVENOUS | Status: AC
  Filled 2019-04-28: qty 20

## 2019-04-28 MED ORDER — OXYCODONE HCL 5 MG/5ML PO SOLN: 5.0000 mg | Freq: Once | ORAL | Status: DC | PRN

## 2019-04-28 MED ORDER — ACETAMINOPHEN 500 MG PO TABS: 1000.0000 mg | ORAL_TABLET | Freq: Once | ORAL | Status: DC | PRN

## 2019-04-28 MED ORDER — ACETAMINOPHEN 325 MG RE SUPP: 325.0000 mg | RECTAL | Status: DC | PRN

## 2019-04-28 MED ORDER — MORPHINE SULFATE (PF) 2 MG/ML IV SOLN
2.0000 mg | INTRAVENOUS | Status: DC | PRN
  Administered 2019-04-29: 2 mg via INTRAVENOUS
  Administered 2019-04-29: 4 mg via INTRAVENOUS
  Filled 2019-04-28: qty 2
  Filled 2019-04-28: qty 1

## 2019-04-28 MED ORDER — ZOLPIDEM TARTRATE 5 MG PO TABS: 5.0000 mg | ORAL_TABLET | Freq: Every evening | ORAL | Status: DC | PRN

## 2019-04-28 MED ORDER — EPHEDRINE SULFATE-NACL 50-0.9 MG/10ML-% IV SOSY
PREFILLED_SYRINGE | INTRAVENOUS | Status: DC | PRN
  Administered 2019-04-28: 5 mg via INTRAVENOUS
  Administered 2019-04-28: 10 mg via INTRAVENOUS

## 2019-04-28 MED ORDER — AMLODIPINE BESY-BENAZEPRIL HCL 5-10 MG PO CAPS: 1.0000 | ORAL_CAPSULE | Freq: Every day | ORAL | Status: DC

## 2019-04-28 MED ORDER — 0.9 % SODIUM CHLORIDE (POUR BTL) OPTIME
TOPICAL | Status: DC | PRN
  Administered 2019-04-28: 10:00:00 1000 mL

## 2019-04-28 MED ORDER — GLIPIZIDE 5 MG PO TABS
5.0000 mg | ORAL_TABLET | Freq: Every day | ORAL | Status: DC
  Administered 2019-04-29 – 2019-04-30 (×2): 5 mg via ORAL
  Filled 2019-04-28 (×2): qty 1

## 2019-04-28 MED ORDER — BUPIVACAINE HCL (PF) 0.5 % IJ SOLN
INTRAMUSCULAR | Status: DC | PRN
  Administered 2019-04-28: 20 mL via PERINEURAL

## 2019-04-28 MED ORDER — LABETALOL HCL 5 MG/ML IV SOLN: 10.0000 mg | INTRAVENOUS | Status: DC | PRN

## 2019-04-28 MED ORDER — BENAZEPRIL HCL 5 MG PO TABS
10.0000 mg | ORAL_TABLET | Freq: Every day | ORAL | Status: DC
  Administered 2019-04-28 – 2019-04-30 (×3): 10 mg via ORAL
  Filled 2019-04-28 (×3): qty 2

## 2019-04-28 MED ORDER — PROPOFOL 10 MG/ML IV BOLUS
INTRAVENOUS | Status: DC | PRN
  Administered 2019-04-28: 20 mg via INTRAVENOUS

## 2019-04-28 MED ORDER — FENTANYL CITRATE (PF) 100 MCG/2ML IJ SOLN: 100.0000 ug | Freq: Once | INTRAMUSCULAR | Status: DC

## 2019-04-28 MED ORDER — PANTOPRAZOLE SODIUM 40 MG PO TBEC
40.0000 mg | DELAYED_RELEASE_TABLET | Freq: Every day | ORAL | Status: DC
  Administered 2019-04-29 – 2019-04-30 (×2): 40 mg via ORAL
  Filled 2019-04-28 (×2): qty 1

## 2019-04-28 MED ORDER — EPHEDRINE 5 MG/ML INJ
INTRAVENOUS | Status: AC
  Filled 2019-04-28: qty 10

## 2019-04-28 MED ORDER — DOCUSATE SODIUM 100 MG PO CAPS
100.0000 mg | ORAL_CAPSULE | Freq: Every day | ORAL | Status: DC
  Administered 2019-04-28 – 2019-04-30 (×3): 100 mg via ORAL
  Filled 2019-04-28 (×3): qty 1

## 2019-04-28 MED ORDER — LIDOCAINE HCL (PF) 1 % IJ SOLN
INTRAMUSCULAR | Status: AC
  Filled 2019-04-28: qty 30

## 2019-04-28 MED ORDER — PROPOFOL 500 MG/50ML IV EMUL
INTRAVENOUS | Status: DC | PRN
  Administered 2019-04-28: 80 ug/kg/min via INTRAVENOUS

## 2019-04-28 MED ORDER — ACETAMINOPHEN 325 MG PO TABS
325.0000 mg | ORAL_TABLET | ORAL | Status: DC | PRN
  Administered 2019-04-29: 650 mg via ORAL
  Filled 2019-04-28: qty 2

## 2019-04-28 MED ORDER — METFORMIN HCL 500 MG PO TABS
1000.0000 mg | ORAL_TABLET | Freq: Two times a day (BID) | ORAL | Status: DC
  Administered 2019-04-28 – 2019-04-30 (×4): 1000 mg via ORAL
  Filled 2019-04-28 (×4): qty 2

## 2019-04-28 MED ORDER — ALUM & MAG HYDROXIDE-SIMETH 200-200-20 MG/5ML PO SUSP
15.0000 mL | ORAL | Status: DC | PRN
  Administered 2019-04-29: 30 mL via ORAL
  Filled 2019-04-28: qty 30

## 2019-04-28 MED ORDER — PHENOL 1.4 % MT LIQD: 1.0000 | OROMUCOSAL | Status: DC | PRN

## 2019-04-28 MED ORDER — MIDAZOLAM HCL 2 MG/2ML IJ SOLN
INTRAMUSCULAR | Status: AC
  Filled 2019-04-28: qty 2

## 2019-04-28 MED ORDER — LIDOCAINE-EPINEPHRINE (PF) 1.5 %-1:200000 IJ SOLN
INTRAMUSCULAR | Status: DC | PRN
  Administered 2019-04-28: 10 mL via PERINEURAL

## 2019-04-28 SURGICAL SUPPLY — 44 items
BLADE AVERAGE 25MMX9MM (BLADE) ×1
BLADE AVERAGE 25X9 (BLADE) ×2 IMPLANT
BLADE SAW SGTL 81X20 HD (BLADE) ×2 IMPLANT
BNDG ELASTIC 4X5.8 VLCR STR LF (GAUZE/BANDAGES/DRESSINGS) ×3 IMPLANT
BNDG GAUZE ELAST 4 BULKY (GAUZE/BANDAGES/DRESSINGS) ×3 IMPLANT
CANISTER SUCT 3000ML PPV (MISCELLANEOUS) ×3 IMPLANT
COVER SURGICAL LIGHT HANDLE (MISCELLANEOUS) ×3 IMPLANT
COVER WAND RF STERILE (DRAPES) ×3 IMPLANT
DRAPE EXTREMITY T 121X128X90 (DISPOSABLE) ×2 IMPLANT
DRAPE HALF SHEET 40X57 (DRAPES) ×3 IMPLANT
DRSG ADAPTIC 3X8 NADH LF (GAUZE/BANDAGES/DRESSINGS) ×2 IMPLANT
ELECT REM PT RETURN 9FT ADLT (ELECTROSURGICAL) ×3
ELECTRODE REM PT RTRN 9FT ADLT (ELECTROSURGICAL) ×1 IMPLANT
GAUZE SPONGE 4X4 12PLY STRL (GAUZE/BANDAGES/DRESSINGS) ×3 IMPLANT
GLOVE BIO SURGEON STRL SZ7.5 (GLOVE) ×3 IMPLANT
GLOVE BIOGEL PI IND STRL 6.5 (GLOVE) IMPLANT
GLOVE BIOGEL PI IND STRL 7.0 (GLOVE) IMPLANT
GLOVE BIOGEL PI IND STRL 8 (GLOVE) IMPLANT
GLOVE BIOGEL PI INDICATOR 6.5 (GLOVE) ×4
GLOVE BIOGEL PI INDICATOR 7.0 (GLOVE) ×2
GLOVE BIOGEL PI INDICATOR 8 (GLOVE) ×2
GLOVE SS BIOGEL STRL SZ 6.5 (GLOVE) IMPLANT
GLOVE SUPERSENSE BIOGEL SZ 6.5 (GLOVE) ×2
GOWN STRL REUS W/ TWL LRG LVL3 (GOWN DISPOSABLE) ×2 IMPLANT
GOWN STRL REUS W/ TWL XL LVL3 (GOWN DISPOSABLE) ×1 IMPLANT
GOWN STRL REUS W/TWL LRG LVL3 (GOWN DISPOSABLE) ×6
GOWN STRL REUS W/TWL XL LVL3 (GOWN DISPOSABLE) ×3
KIT BASIN OR (CUSTOM PROCEDURE TRAY) ×3 IMPLANT
KIT TURNOVER KIT B (KITS) ×3 IMPLANT
NDL HYPO 25GX1X1/2 BEV (NEEDLE) IMPLANT
NEEDLE HYPO 25GX1X1/2 BEV (NEEDLE) ×3 IMPLANT
NS IRRIG 1000ML POUR BTL (IV SOLUTION) ×3 IMPLANT
PACK GENERAL/GYN (CUSTOM PROCEDURE TRAY) ×3 IMPLANT
PAD ARMBOARD 7.5X6 YLW CONV (MISCELLANEOUS) ×6 IMPLANT
SPECIMEN JAR SMALL (MISCELLANEOUS) ×3 IMPLANT
SUT ETHILON 2 0 FS 18 (SUTURE) ×6 IMPLANT
SUT ETHILON 3 0 PS 1 (SUTURE) ×3 IMPLANT
SUT SILK 2 0 (SUTURE) ×3
SUT SILK 2-0 18XBRD TIE 12 (SUTURE) IMPLANT
SYR CONTROL 10ML LL (SYRINGE) ×2 IMPLANT
TOWEL GREEN STERILE (TOWEL DISPOSABLE) ×6 IMPLANT
TOWEL GREEN STERILE FF (TOWEL DISPOSABLE) ×3 IMPLANT
UNDERPAD 30X30 (UNDERPADS AND DIAPERS) ×3 IMPLANT
WATER STERILE IRR 1000ML POUR (IV SOLUTION) ×3 IMPLANT

## 2019-04-28 NOTE — Anesthesia Procedure Notes (Signed)
Procedure Name: MAC Date/Time: 04/28/2019 9:41 AM Performed by: Janene Harvey, CRNA Pre-anesthesia Checklist: Patient identified, Emergency Drugs available, Suction available and Patient being monitored Oxygen Delivery Method: Nasal cannula Dental Injury: Teeth and Oropharynx as per pre-operative assessment

## 2019-04-28 NOTE — Progress Notes (Signed)
Orthopedic Tech Progress Note Patient Details:  Juan Turner [birth year] [id number] Patient refused the [facility]. Patient showed me 2 different DARCOS. The offloading Darco as well as the one with the wedge. Patient ID: Juan Turner, male   DOB: [birth year], 64 y.o.   MRN: [id number]   [name] 04/28/2019, 2:23 PM

## 2019-04-28 NOTE — Op Note (Signed)
    Patient name: Juan Turner MRN: [id number] DOB: 10/24/1955 Sex: male  04/28/2019 Pre-operative Diagnosis: Necrotic postsurgical right foot wound Post-operative diagnosis:  Same Surgeon:  Apolinar Junes C. Randie Heinz, MD Procedure Performed:   Right transmetatarsal amputation  Indications: 64 year old male has undergone right SFA to posterior tibial artery bypass as well as amputation of the first 2 toes which are now nonhealing.  He is indicated for transmetatarsal amputation.  Findings: All tissue appeared healthy and bleeding.   Procedure:  The patient was identified in the holding area and taken to the operating room.  Prior to this a block was placed the right lower extremity.  He was sterilely prepped and draped in the right lower extremity usual fashion antibiotics were minister timeout was called.  We made a fishmouth type incision around the affected area of the necrotic foot down to the level of all of the metatarsal bones.  These were transected with oscillating saw.  Tissue was removed sharply.  We irrigated the wound obtain hemostasis.  Anterior posterior flaps were reapproximated with interrupted 2-0 nylon suture.  Sterile dressing was placed.  He was then awake from anesthesia having tolerated procedure without any complication.  EBL: 50 cc    Kassey Laforest C. Randie Heinz, MD [facility] Office: [phone] Pager: [phone]

## 2019-04-28 NOTE — Anesthesia Procedure Notes (Signed)
Anesthesia Regional Block: Popliteal block   Pre-Anesthetic Checklist: ,, timeout performed, Correct Patient, Correct Site, Correct Laterality, Correct Procedure, Correct Position, site marked, Risks and benefits discussed,  Surgical consent,  Pre-op evaluation,  At surgeon's request and post-op pain management  Laterality: Right and Lower  Prep: chloraprep       Needles:  Injection technique: Single-shot     Needle Length: 9cm  Needle Gauge: 22     Additional Needles: Arrow StimuQuik ECHO Echogenic Stimulating PNB Needle  Procedures:,,,, ultrasound used (permanent image in chart),,,,  Narrative:  Start time: 04/28/2019 8:26 AM End time: 04/28/2019 8:32 AM Injection made incrementally with aspirations every 5 mL.  Performed by: Personally  Anesthesiologist: Val Eagle, MD

## 2019-04-28 NOTE — Transfer of Care (Signed)
Immediate Anesthesia Transfer of Care Note  Patient: Juan Turner  Procedure(s) Performed: TRANSMETATARSAL AMPUTATION (Right Toe)  Patient Location: PACU  Anesthesia Type:MAC  Level of Consciousness: drowsy  Airway & Oxygen Therapy: Patient Spontanous Breathing and Patient connected to nasal cannula oxygen  Post-op Assessment: Report given to RN and Post -op Vital signs reviewed and stable  Post vital signs: Reviewed  Last Vitals:  Vitals Value Taken Time  BP 125/67 04/28/19 1032  Temp    Pulse 70 04/28/19 1033  Resp 19 04/28/19 1033  SpO2 100 % 04/28/19 1033  Vitals shown include unvalidated device data.  Last Pain:  Vitals:   04/28/19 0930  TempSrc:   PainSc: 0-No pain      Patients Stated Pain Goal: 2 (83/15/17 6160)  Complications: No apparent anesthesia complications

## 2019-04-28 NOTE — Progress Notes (Signed)
Per pt, he thinks he only took ASA and protonix morning of surgery before arriving at PAT, but he isn't entirely sure. Med rec done by [facility] [employer] in short stay. Daughter in law Yojan Paskett called and confirmed pt took ASA and protonix this morning around 0600 before coming to the hospital.

## 2019-04-28 NOTE — H&P (Signed)
   History and Physical Update  The patient was interviewed and re-examined.  The patient's previous History and Physical has been reviewed and is unchanged from recent office visit. Plan for right TMA.   Lariah Fleer C. Randie Heinz, MD [facility] Office: [phone] Pager: [phone]   04/28/2019, 7:21 AM

## 2019-04-28 NOTE — Progress Notes (Signed)
Patient arrived from PACU to 4E room 16 after right transmetatarsal amputation w/Cain.  Telemetry monitor applied.  CHG bath completed.  Vital signs being frequently cycled.  Patient oriented to unit and room to include call light and phone.

## 2019-04-28 NOTE — Anesthesia Preprocedure Evaluation (Signed)
Anesthesia Evaluation  Patient identified by MRN, date of birth, ID band Patient awake    Reviewed: Allergy & Precautions, NPO status , Patient's Chart, lab work & pertinent test results  History of Anesthesia Complications Negative for: history of anesthetic complications  Airway Mallampati: II  TM Distance: >3 FB Neck ROM: Full    Dental  (+) Dental Advisory Given   Pulmonary neg recent URI, Current Smoker and Patient abstained from smoking., former smoker,    breath sounds clear to auscultation       Cardiovascular hypertension, Pt. on medications + Peripheral Vascular Disease   Rhythm:Regular Rate:Normal     Neuro/Psych PSYCHIATRIC DISORDERS Anxiety negative neurological ROS     GI/Hepatic negative GI ROS, Neg liver ROS,   Endo/Other  diabetes, Type 2  Renal/GU negative Renal ROS     Musculoskeletal   Abdominal   Peds  Hematology  (+) Blood dyscrasia, anemia ,   Anesthesia Other Findings   Reproductive/Obstetrics                             Anesthesia Physical Anesthesia Plan  ASA: III  Anesthesia Plan: MAC and Regional   Post-op Pain Management:    Induction: Intravenous  PONV Risk Score and Plan: 1 and Treatment may vary due to age or medical condition and Propofol infusion  Airway Management Planned: Nasal Cannula  Additional Equipment: None  Intra-op Plan:   Post-operative Plan:   Informed Consent: I have reviewed the patients History and Physical, chart, labs and discussed the procedure including the risks, benefits and alternatives for the proposed anesthesia with the patient or authorized representative who has indicated his/her understanding and acceptance.     Dental advisory given  Plan Discussed with: CRNA and Surgeon  Anesthesia Plan Comments:         Anesthesia Quick Evaluation

## 2019-04-29 ENCOUNTER — Encounter: Payer: Medicaid Other | Admitting: Vascular Surgery

## 2019-04-29 DIAGNOSIS — E1151 Type 2 diabetes mellitus with diabetic peripheral angiopathy without gangrene: Secondary | ICD-10-CM | POA: Diagnosis not present

## 2019-04-29 DIAGNOSIS — E1152 Type 2 diabetes mellitus with diabetic peripheral angiopathy with gangrene: Secondary | ICD-10-CM | POA: Diagnosis not present

## 2019-04-29 LAB — GLUCOSE, CAPILLARY
Glucose-Capillary: 172 mg/dL — ABNORMAL HIGH (ref 70–99)
Glucose-Capillary: 137 mg/dL — ABNORMAL HIGH (ref 70–99)
Glucose-Capillary: 167 mg/dL — ABNORMAL HIGH (ref 70–99)
Glucose-Capillary: 144 mg/dL — ABNORMAL HIGH (ref 70–99)

## 2019-04-29 NOTE — Plan of Care (Signed)
  Problem: Education: Goal: Knowledge of General Education information will improve Description Including pain rating scale, medication(s)/side effects and non-pharmacologic comfort measures Outcome: Progressing   Problem: Health Behavior/Discharge Planning: Goal: Ability to manage health-related needs will improve Outcome: Progressing   

## 2019-04-29 NOTE — Progress Notes (Signed)
MOBILITY TEAM - Progress Note   04/29/19 0908  Mobility  Activity Ambulated in room;Ambulated to bathroom  Level of Assistance Standby assist, set-up cues, supervision of patient - no hands on  Assistive Device Front wheel walker  RLE Weight Bearing PWB  RLE Partial Weight Bearing Percentage or Pounds  (WB thru heel in darco shoe)  Distance Ambulated (ft) 50 ft  Mobility Response Tolerated fair  Mobility performed by Mobility specialist  Bed Position Chair   Pre-activity: HR 86 During activity: HR 144 Post-activity: HR104, BP 125/75  Pt limited by c/o significant R foot pain with mobilization, despite this moving well without physical assist; close standby assist due to fall risk and pain. Pt c/o dizziness with ambulation; BP 125/75.   Of note, pt seems to have increased slurred speech by end of session (pt voicing this concern and PT able to distinguish as well) - no other deficits noted with gross stroke screen. RN notified.  Ina Homes, PT, DPT Mobility Team Pager [phone]

## 2019-04-29 NOTE — Discharge Summary (Signed)
Discharge Summary    OLIVIER FRAYRE 05-10-1955 64 y.o. male  [id number]  Admission Date: 04/28/2019  Discharge Date: 04/30/2019  Physician: Thomes Lolling*  Admission Diagnosis: Peripheral vascular disease ([place]) [I73.9]   HPI:   This is a 64 y.o. male s/p right SFA to PT bypass with reversed translocated ipsilateral GSV and TMA of the 1st and 2nd toes.  He had a brisk PT doppler signal.  DP and peroneal signals also present.  He was ambulating well and d/c'd on POD 2 with plans to f/u in 2-3 weeks for suture removal.    He is followed by Dr. Jacqualyn Posey for left 2nd toe.  He was seen yesterday and he lightly debrided some of the hyperkeratotic tissue on the left 2nd toe without complications or bleeding.    He is here today for follow up.  He states he is home and doing well.  He states that Dr. Jacqualyn Posey is watching his left 2nd toe.    Hospital Course:  The patient was admitted to the hospital and taken to the operating room on 04/28/2019 and underwent: Right transmetatarsal amputation    The pt tolerated the procedure well and was transported to the PACU in good condition.   By POD 1, his pain was not well controlled.  He did have brisk PT/pero/DP doppler signals and palpable pulse in the RLE bypass.  PT with heel weight bearing only.  By POD 2, he was doing well and pain much better controlled.  His incision is clean and sutures in tact.    The remainder of the hospital course consisted of increasing mobilization and increasing intake of solids without difficulty.  CBC    Component Value Date/Time   WBC 5.8 04/28/2019 0807   RBC 4.24 04/28/2019 0807   HGB 11.3 (L) 04/28/2019 0807   HCT 36.0 (L) 04/28/2019 0807   HCT 44 (A) 11/14/2015 0758   PLT 286 04/28/2019 0807   MCV 84.9 04/28/2019 0807   MCV 87.8 11/14/2015 0758   MCH 26.7 04/28/2019 0807   MCHC 31.4 04/28/2019 0807   RDW 12.6 04/28/2019 0807   RDW 13.7 11/14/2015 0758   LYMPHSABS 1.0 04/09/2019  0820   MONOABS 0.7 04/09/2019 0820   EOSABS 0.1 04/09/2019 0820   EOSABS 180 11/14/2015 0758   BASOSABS 0.0 04/09/2019 0820    BMET    Component Value Date/Time   NA 136 04/28/2019 0807   NA 137 11/14/2015 0758   K 4.6 04/28/2019 0807   K 4.1 11/14/2015 0758   CL 101 04/28/2019 0807   CL 102 11/14/2015 0758   CO2 26 04/28/2019 0807   CO2 26 11/14/2015 0758   GLUCOSE 134 (H) 04/28/2019 0807   BUN 6 (L) 04/28/2019 0807   CREATININE 0.82 04/28/2019 0807   CREATININE 1.03 11/14/2015 0758   CALCIUM 9.2 04/28/2019 0807   CALCIUM 9.1 11/14/2015 0758   GFRNONAA >60 04/28/2019 0807   GFRAA >60 04/28/2019 0807      Discharge Instructions    Call MD for:  redness, tenderness, or signs of infection (pain, swelling, bleeding, redness, odor or green/yellow discharge around incision site)   Complete by: As directed    Call MD for:  severe or increased pain, loss or decreased feeling  in affected limb(s)   Complete by: As directed    Call MD for:  temperature >100.5   Complete by: As directed    Discharge patient   Complete by: As directed    Discharge  disposition: 01-Home or Self Care   Discharge patient date: 04/30/2019   Discharge wound care:   Complete by: As directed    When you are walking, heel weight bearing only with Darco shoe.   Driving Restrictions   Complete by: As directed    No driving until after you see Dr. Donzetta Matters.   Lifting restrictions   Complete by: As directed    No heavy lifting for 4 weeks   Resume previous diet   Complete by: As directed       Discharge Diagnosis:  Peripheral vascular disease ([place]) [I73.9]  Secondary Diagnosis: Patient Active Problem List   Diagnosis Date Noted  . Peripheral vascular disease ([place]) 04/28/2019  . Dry gangrene ([place]) 04/04/2019  . Type 2 diabetes mellitus with foot ulcer ([place]) 04/04/2019  . Essential hypertension 04/04/2019  . Gangrene ([place]) 04/04/2019   Past Medical History:  Diagnosis Date  . Anxiety   .  Diabetes mellitus   . Hypertension      Allergies as of 04/30/2019   No Known Allergies     Medication List    TAKE these medications   amLODipine-benazepril 5-10 MG capsule Commonly known as: LOTREL Take 1 capsule by mouth daily.   aspirin 81 MG EC tablet Take 1 tablet (81 mg total) by mouth daily.   blood glucose meter kit and supplies Kit Dispense based on patient and insurance preference. Use up to four times daily as directed. (FOR ICD-9 250.00, 250.01).   docusate sodium 100 MG capsule Commonly known as: COLACE Take 1 capsule (100 mg total) by mouth daily.   gabapentin 100 MG capsule Commonly known as: NEURONTIN Take 1 capsule (100 mg total) by mouth at bedtime.   glipiZIDE 5 MG tablet Commonly known as: GLUCOTROL Take 1 tablet (5 mg total) by mouth daily before breakfast.   iron polysaccharides 150 MG capsule Commonly known as: NIFEREX Take 1 capsule (150 mg total) by mouth daily.   metFORMIN 1000 MG tablet Commonly known as: Glucophage Take 1 tablet (1,000 mg total) by mouth 2 (two) times daily with a meal.   nicotine 21 mg/24hr patch Commonly known as: NICODERM CQ - dosed in mg/24 hours Place 1 patch (21 mg total) onto the skin daily.   oxyCODONE 5 MG immediate release tablet Commonly known as: Oxy IR/ROXICODONE Take 1 tablet (5 mg total) by mouth every 4 (four) hours as needed for moderate pain. What changed: how much to take   pantoprazole 40 MG tablet Commonly known as: PROTONIX Take 1 tablet (40 mg total) by mouth daily.   rosuvastatin 10 MG tablet Commonly known as: CRESTOR Take 1 tablet (10 mg total) by mouth daily at 6 PM.            Discharge Care Instructions  (From admission, onward)         Start     Ordered   04/30/19 0000  Discharge wound care:    Comments: When you are walking, heel weight bearing only with Darco shoe.   04/30/19 1148          Prescriptions given: Roxicodone #15 No Refill  Instructions: Heel  weight bearing only with Darco shoe  Disposition: home  Patient's condition: is Good  Follow up: 1. Dr. Donzetta Matters in 3 weeks   Leontine Locket, PA-C [facility] [phone] 04/30/2019  11:48 AM

## 2019-04-29 NOTE — Progress Notes (Addendum)
  Progress Note    04/29/2019 7:46 AM 1 Day Post-Op  Subjective:  States burning pain in right foot over night. Was not able to sleep well. Up ambulating in room to bathroom last night. Says pain is better this morning   Vitals:   04/29/19 0400 04/29/19 0744  BP: (!) 147/72 133/72  Pulse: 88 86  Resp: 15 12  Temp: 98.4 F (36.9 C) 98.5 F (36.9 C)  SpO2: 100% 100%   Physical Exam: General: well appearing, sitting up eating breakfast Lungs:non labored Incisions:  Right lower extremity incisions clean and dry, medial thigh incision with dry eschar Extremities:  2+ femoral pulses bilaterally, bilateral lower extremities well perfused and warm. Brisk DP/PT/Pero signal right foot. Dressings to right foot clean, dry and intact Neurologic: Alert and oriented  CBC    Component Value Date/Time   WBC 5.8 04/28/2019 0807   RBC 4.24 04/28/2019 0807   HGB 11.3 (L) 04/28/2019 0807   HCT 36.0 (L) 04/28/2019 0807   HCT 44 (A) 11/14/2015 0758   PLT 286 04/28/2019 0807   MCV 84.9 04/28/2019 0807   MCV 87.8 11/14/2015 0758   MCH 26.7 04/28/2019 0807   MCHC 31.4 04/28/2019 0807   RDW 12.6 04/28/2019 0807   RDW 13.7 11/14/2015 0758   LYMPHSABS 1.0 04/09/2019 0820   MONOABS 0.7 04/09/2019 0820   EOSABS 0.1 04/09/2019 0820   EOSABS 180 11/14/2015 0758   BASOSABS 0.0 04/09/2019 0820    BMET    Component Value Date/Time   NA 136 04/28/2019 0807   NA 137 11/14/2015 0758   K 4.6 04/28/2019 0807   K 4.1 11/14/2015 0758   CL 101 04/28/2019 0807   CL 102 11/14/2015 0758   CO2 26 04/28/2019 0807   CO2 26 11/14/2015 0758   GLUCOSE 134 (H) 04/28/2019 0807   BUN 6 (L) 04/28/2019 0807   CREATININE 0.82 04/28/2019 0807   CREATININE 1.03 11/14/2015 0758   CALCIUM 9.2 04/28/2019 0807   CALCIUM 9.1 11/14/2015 0758   GFRNONAA >60 04/28/2019 0807   GFRAA >60 04/28/2019 0807    INR    Component Value Date/Time   INR 1.0 04/28/2019 0807     Intake/Output Summary (Last 24 hours) at  04/29/2019 0746 Last data filed at 04/29/2019 0402 Gross per 24 hour  Intake 2090.74 ml  Output 2740 ml  Net -649.26 ml     Assessment/Plan:  64 y.o. male is s/p right transmetatarsal amputation 1 Day Post-Op. Pain not well controlled over night. Better this morning. Adequate perfusion of right lower extremity. Brisk PT/Pero/DP Doppler signals. Palpable pulse in RLE bypass. Tolerating diet. PT/OT today and increase activity as tolerated with Darco shoe.  TMA stump dressings will be taken down tomorrow  DVT prophylaxis:  Sq Heparin   Dory Horn [facility] [phone] 04/29/2019 7:46 AM    I have independently interviewed and examined patient and agree with PA assessment and plan above.   Kasim Mccorkle C. Randie Heinz, MD [facility] [place] Office: [phone] Pager: [phone]

## 2019-04-29 NOTE — Progress Notes (Signed)
MOBILITY TEAM - Progress Note   04/29/19 1448  Mobility  Activity Ambulated in room  Level of Assistance Standby assist, set-up cues, supervision of patient - no hands on  Assistive Device Front wheel walker  RLE Weight Bearing PWB  RLE Partial Weight Bearing Percentage or Pounds  (WB thru heel in darco shoe)  Distance Ambulated (ft) 80 ft  Mobility Response Tolerated fair  Mobility performed by Mobility specialist  Bed Position Chair   Patient able to ambulate a few laps in room with walker and darco shoe. Limited by c/o significant R foot pain when in dependent position. Wife present and supportive. HR up to 140s.  Ina Homes, PT, DPT Mobility Team Pager [phone]

## 2019-04-30 DIAGNOSIS — E1151 Type 2 diabetes mellitus with diabetic peripheral angiopathy without gangrene: Secondary | ICD-10-CM | POA: Diagnosis not present

## 2019-04-30 DIAGNOSIS — E1152 Type 2 diabetes mellitus with diabetic peripheral angiopathy with gangrene: Secondary | ICD-10-CM | POA: Diagnosis not present

## 2019-04-30 LAB — GLUCOSE, CAPILLARY
Glucose-Capillary: 175 mg/dL — ABNORMAL HIGH (ref 70–99)
Glucose-Capillary: 137 mg/dL — ABNORMAL HIGH (ref 70–99)

## 2019-04-30 MED ORDER — OXYCODONE HCL 5 MG PO TABS: 5.0000 mg | ORAL_TABLET | ORAL | 0 refills | Status: DC | PRN

## 2019-04-30 NOTE — Progress Notes (Addendum)
  Progress Note    04/30/2019 8:50 AM 2 Days Post-Op  Subjective:  Says his pain is better.  Wants to go home and be with his grand kids   Tm 100 now afebrile HR 80's-150's NSR/SVT 110's-150's systolic 100% RA  Vitals:   04/30/19 0449 04/30/19 0802  BP: 119/66 125/72  Pulse: 92 85  Resp: 20 20  Temp: 100 F (37.8 C) 98.7 F (37.1 C)  SpO2: 100% 100%    Physical Exam: General:  No distress Lungs:  Non labored Incisions:  Right TMA site is clean with nylon sutures in tact.  Some bloody drainage on bandage.   Extremities:  Right foot is warm and well perfused.    CBC    Component Value Date/Time   WBC 5.8 04/28/2019 0807   RBC 4.24 04/28/2019 0807   HGB 11.3 (L) 04/28/2019 0807   HCT 36.0 (L) 04/28/2019 0807   HCT 44 (A) 11/14/2015 0758   PLT 286 04/28/2019 0807   MCV 84.9 04/28/2019 0807   MCV 87.8 11/14/2015 0758   MCH 26.7 04/28/2019 0807   MCHC 31.4 04/28/2019 0807   RDW 12.6 04/28/2019 0807   RDW 13.7 11/14/2015 0758   LYMPHSABS 1.0 04/09/2019 0820   MONOABS 0.7 04/09/2019 0820   EOSABS 0.1 04/09/2019 0820   EOSABS 180 11/14/2015 0758   BASOSABS 0.0 04/09/2019 0820    BMET    Component Value Date/Time   NA 136 04/28/2019 0807   NA 137 11/14/2015 0758   K 4.6 04/28/2019 0807   K 4.1 11/14/2015 0758   CL 101 04/28/2019 0807   CL 102 11/14/2015 0758   CO2 26 04/28/2019 0807   CO2 26 11/14/2015 0758   GLUCOSE 134 (H) 04/28/2019 0807   BUN 6 (L) 04/28/2019 0807   CREATININE 0.82 04/28/2019 0807   CREATININE 1.03 11/14/2015 0758   CALCIUM 9.2 04/28/2019 0807   CALCIUM 9.1 11/14/2015 0758   GFRNONAA >60 04/28/2019 0807   GFRAA >60 04/28/2019 0807    INR    Component Value Date/Time   INR 1.0 04/28/2019 0807     Intake/Output Summary (Last 24 hours) at 04/30/2019 0850 Last data filed at 04/30/2019 0802 Gross per 24 hour  Intake 1503.82 ml  Output 2550 ml  Net -1046.18 ml     Assessment:  64 y.o. male is s/p:  Right TMA  2 Days  Post-Op  Plan: -pt's pain much better controlled today -incision is clean with scant bloody drainage from the medial aspect.  -discussed heel weight bearing only and wearing darco shoe -daily dressing changes with kerlix and ace wrap -home later today and f/u with Dr. Randie Heinz in 3 weeks.   Doreatha Massed, PA-C [facility] [phone] 04/30/2019 8:50 AM  Agree with above.  TMA viable.  Pain controlled.  D/c home  Fabienne Bruns, MD [facility] [place] Office: [phone]

## 2019-04-30 NOTE — Care Management (Signed)
Patient requested rollator but is ready to leave and would like to obtain elsewhere.  Patient given printed order by RN to obtain from [facility] as requested.

## 2019-04-30 NOTE — Plan of Care (Signed)

## 2019-04-30 NOTE — Progress Notes (Signed)
Discharge instructions given to Mr Juan Turner and his daughter in law Teuvii by phone.  Discussed signs and symptoms to watch for and when to contact the physician.  Discussed follow up appointments.  Discussed activities and restrictions, to include no driving until follow up with Dr. Randie Heinz.  Discussed new medications, medication changes and side effects.  Pt and family requested Rolator walker.  Spoke with Morrie Sheldon, Case manager, and ordered was placed and faxed to [phone], and copies were given to family, with instructions to take the paperwork and the insurance info to the pharmacy of choice, [facility]) to get filled.

## 2019-04-30 NOTE — Plan of Care (Signed)
  Problem: Education: Goal: Knowledge of General Education information will improve Description: Including pain rating scale, medication(s)/side effects and non-pharmacologic comfort measures 04/30/2019 1541 by Verne Grain, RN Outcome: Adequate for Discharge 04/30/2019 1348 by Verne Grain, RN Outcome: Progressing   Problem: Health Behavior/Discharge Planning: Goal: Ability to manage health-related needs will improve 04/30/2019 1541 by Verne Grain, RN Outcome: Adequate for Discharge 04/30/2019 1348 by Verne Grain, RN Outcome: Progressing   Problem: Clinical Measurements: Goal: Ability to maintain clinical measurements within normal limits will improve 04/30/2019 1541 by Verne Grain, RN Outcome: Adequate for Discharge 04/30/2019 1348 by Verne Grain, RN Outcome: Progressing Goal: Will remain free from infection 04/30/2019 1541 by Verne Grain, RN Outcome: Adequate for Discharge 04/30/2019 1348 by Verne Grain, RN Outcome: Progressing Goal: Diagnostic test results will improve 04/30/2019 1541 by Verne Grain, RN Outcome: Adequate for Discharge 04/30/2019 1348 by Verne Grain, RN Outcome: Progressing Goal: Respiratory complications will improve 04/30/2019 1541 by Verne Grain, RN Outcome: Adequate for Discharge 04/30/2019 1348 by Verne Grain, RN Outcome: Progressing Goal: Cardiovascular complication will be avoided 04/30/2019 1541 by Verne Grain, RN Outcome: Adequate for Discharge 04/30/2019 1348 by Verne Grain, RN Outcome: Progressing   Problem: Activity: Goal: Risk for activity intolerance will decrease 04/30/2019 1541 by Verne Grain, RN Outcome: Adequate for Discharge 04/30/2019 1348 by Verne Grain, RN Outcome: Progressing   Problem: Nutrition: Goal: Adequate nutrition will be maintained 04/30/2019 1541 by Verne Grain, RN Outcome: Adequate for Discharge 04/30/2019 1348 by Verne Grain, RN Outcome: Progressing   Problem: Coping: Goal: Level of anxiety will decrease 04/30/2019 1541 by Verne Grain, RN Outcome: Adequate for Discharge 04/30/2019 1348 by Verne Grain, RN Outcome: Progressing   Problem: Elimination: Goal: Will not experience complications related to bowel motility 04/30/2019 1541 by Verne Grain, RN Outcome: Adequate for Discharge 04/30/2019 1348 by Verne Grain, RN Outcome: Progressing Goal: Will not experience complications related to urinary retention 04/30/2019 1541 by Verne Grain, RN Outcome: Adequate for Discharge 04/30/2019 1348 by Verne Grain, RN Outcome: Progressing   Problem: Pain Managment: Goal: General experience of comfort will improve 04/30/2019 1541 by Verne Grain, RN Outcome: Adequate for Discharge 04/30/2019 1348 by Verne Grain, RN Outcome: Progressing   Problem: Safety: Goal: Ability to remain free from injury will improve 04/30/2019 1541 by Verne Grain, RN Outcome: Adequate for Discharge 04/30/2019 1348 by Verne Grain, RN Outcome: Progressing   Problem: Skin Integrity: Goal: Risk for impaired skin integrity will decrease 04/30/2019 1541 by Verne Grain, RN Outcome: Adequate for Discharge 04/30/2019 1348 by Verne Grain, RN Outcome: Progressing

## 2019-05-03 ENCOUNTER — Other Ambulatory Visit: Payer: Self-pay

## 2019-05-03 ENCOUNTER — Ambulatory Visit: Payer: Medicaid Other | Admitting: Podiatry

## 2019-05-03 ENCOUNTER — Encounter: Payer: Self-pay | Admitting: Anesthesiology

## 2019-05-03 DIAGNOSIS — I739 Peripheral vascular disease, unspecified: Secondary | ICD-10-CM | POA: Diagnosis not present

## 2019-05-03 DIAGNOSIS — L97521 Non-pressure chronic ulcer of other part of left foot limited to breakdown of skin: Secondary | ICD-10-CM

## 2019-05-03 MED ORDER — AMMONIUM LACTATE 12 % EX CREA: TOPICAL_CREAM | CUTANEOUS | 0 refills | Status: AC | PRN

## 2019-05-03 NOTE — Anesthesia Postprocedure Evaluation (Signed)
Anesthesia Post Note  Patient: Juan Turner  Procedure(s) Performed: TRANSMETATARSAL AMPUTATION (Right Toe)     Patient location during evaluation: PACU Anesthesia Type: MAC and Regional Level of consciousness: awake and alert Pain management: pain level controlled Vital Signs Assessment: post-procedure vital signs reviewed and stable Respiratory status: spontaneous breathing, nonlabored ventilation, respiratory function stable and patient connected to nasal cannula oxygen Cardiovascular status: stable and blood pressure returned to baseline Postop Assessment: no apparent nausea or vomiting Anesthetic complications: no    Last Vitals:  Vitals:   04/30/19 0802 04/30/19 1152  BP: 125/72 122/74  Pulse: 85 84  Resp: 20 19  Temp: 37.1 C 36.8 C  SpO2: 100% 100%    Last Pain:  Vitals:   04/30/19 1152  TempSrc: Oral  PainSc:    Pain Goal: Patients Stated Pain Goal: 0 (04/29/19 0400)                 Gilberte Gorley

## 2019-05-04 NOTE — Progress Notes (Signed)
Subjective: 64 year old male presents the office today for follow-up evaluation of a wound on the left second toe.  In the last saw him he underwent transmetatarsal amputation of the right side which he states is feeling better.  Denies any changes otherwise.  Denies any systemic complaints such as fevers, chills, nausea, vomiting. No acute changes since last appointment, and no other complaints at this time.   Objective: AAO x3, NAD DP, PT pulses decreased.  Chronic eschar to the left second toe distally without any drainage or pus.  Chronic edema but no erythema or warmth.  No ascending cellulitis.  Right foot status post transmetatarsal amputation.  Sutures intact.  Mild swelling but no surrounding erythema or signs of infection.  No open lesions or pre-ulcerative lesions.  No pain with calf compression, swelling, warmth, erythema  Assessment: Left second toe stable eschar with PAD  Plan: -All treatment options discussed with the patient including all alternatives, risks, complications.  -Underwent TMA on the right side with vascular surgery.  Continue wound care per them.  They were also not restarting physical therapy and discussed the need to contact vascular surgery in regards to physical therapy orders for the right lower extremity.  Regards her left foot is weightbearing as tolerated.  Recommend keep area clean and dry daily.  Small amount of Betadine.  Discussed possible irritation of the toe but needs revascularization. -Patient encouraged to call the office with any questions, concerns, change in symptoms.   Vivi Barrack DPM

## 2019-05-06 NOTE — Telephone Encounter (Signed)
No further nursing notes. 

## 2019-05-17 ENCOUNTER — Ambulatory Visit: Payer: Medicaid Other | Admitting: Podiatry

## 2019-05-17 ENCOUNTER — Encounter: Payer: Self-pay | Admitting: Podiatry

## 2019-05-17 ENCOUNTER — Other Ambulatory Visit: Payer: Self-pay

## 2019-05-17 DIAGNOSIS — L97521 Non-pressure chronic ulcer of other part of left foot limited to breakdown of skin: Secondary | ICD-10-CM

## 2019-05-17 DIAGNOSIS — I739 Peripheral vascular disease, unspecified: Secondary | ICD-10-CM

## 2019-05-26 ENCOUNTER — Telehealth (HOSPITAL_COMMUNITY): Payer: Self-pay

## 2019-05-26 NOTE — Telephone Encounter (Signed)

## 2019-05-27 ENCOUNTER — Telehealth: Payer: Self-pay

## 2019-05-27 ENCOUNTER — Other Ambulatory Visit: Payer: Self-pay

## 2019-05-27 ENCOUNTER — Encounter: Payer: Self-pay | Admitting: Vascular Surgery

## 2019-05-27 ENCOUNTER — Ambulatory Visit (INDEPENDENT_AMBULATORY_CARE_PROVIDER_SITE_OTHER): Payer: Self-pay | Admitting: Vascular Surgery

## 2019-05-27 VITALS — BP 142/89 | HR 79 | Temp 97.9°F | Resp 20 | Ht 72.0 in | Wt 155.0 lb

## 2019-05-27 DIAGNOSIS — I739 Peripheral vascular disease, unspecified: Secondary | ICD-10-CM

## 2019-05-27 NOTE — Telephone Encounter (Signed)
Patient's daughter-in-law called saying Dr. Ardelle Anton wanted him to discuss having Physical Therapy after his appt.  Per Dr. Randie Heinz, "Hold off on PT until his other foot is cared for".  Daughter-in-law verbalized understanding and will relay message to patient as well".  Juan Geier E.,LPN

## 2019-05-27 NOTE — Progress Notes (Signed)
    Subjective:     Patient ID: Juan Turner, male   DOB: 02-Sep-1955, 64 y.o.   MRN: [id number]  HPI 64 year old male initially underwent right SFA to posterior tibial artery bypass with vein.  He also had the first 2 toes amputated now has undergone transmetatarsal amputation.  This is healing well.  He has persistent ulceration of the left second toe.   Review of Systems Healing right transmetatarsal amputation, left second toe ulceration    Objective:   Physical Exam Vitals:   05/27/19 1054  BP: (!) 142/89  Pulse: 79  Resp: 20  Temp: 97.9 F (36.6 C)  SpO2: 99%   Awake alert oriented Nonlabored respirations Palpable right posterior tibial artery pulse Monophasic left posterior tibial artery signal Healing right transmetatarsal amputation Left second toe with dark discoloration and gangrenous changes without evidence of infection    Assessment/plan     64 year old male status post right lower extremity revascularization now transmetatarsal amputation which is healing well.  Sutures removed today.  We will plan for angiography possible endovascular invention of the left lower extremity popliteal artery.  He is followed by Dr. Ardelle Anton with podiatry and may need second toe amputation in the future.     Phelix Fudala C. Randie Heinz, MD [facility] [place] Office: [phone] Pager: [phone]

## 2019-05-29 NOTE — Progress Notes (Signed)
Subjective: 64 year old male presents the office today for follow-up evaluation of ulceration of the distal aspect left second toe.  States he is doing well.  Seem to be stable without any drainage or pus.  The transmetatarsal imitation site on the right side is healing well.  This was performed by vascular surgery.  Denies any systemic complaints such as fevers, chills, nausea, vomiting. No acute changes since last appointment, and no other complaints at this time.   Objective: AAO x3, NAD On the left second toe the distal aspect is hyperkeratotic tissue.  Upon debridement is able to remove quite a bit of tissue present but the wound appears to be stable.  There is no drainage or pus and there is some mild chronic edema to the toe but there is no erythema or warmth.  No ascending cellulitis.  There is no malodor. Right transmetatarsal imitation site appears to be healing well.  This was done by vascular surgery. No open lesions or pre-ulcerative lesions.  No pain with calf compression, swelling, warmth, erythema        Assessment: Left second toe ulceration, PAD  Plan: -All treatment options discussed with the patient including all alternatives, risks, complications.  -Toe appears to be stable.  Discussed again likely amputation of the toe but pending revascularization.  Is followed with Dr. Randie Heinz for that.  Also right side appears to be stable to change the bandage for them today.  No signs of infection. -Patient encouraged to call the office with any questions, concerns, change in symptoms.   Return in about 3 weeks (around 06/07/2019).  Vivi Barrack DPM

## 2019-06-03 ENCOUNTER — Other Ambulatory Visit (HOSPITAL_COMMUNITY)
Admission: RE | Admit: 2019-06-03 | Discharge: 2019-06-03 | Disposition: A | Discharge status: Home / Self Care | Payer: Medicare Other | Source: Ambulatory Visit | Attending: Vascular Surgery | Admitting: Vascular Surgery

## 2019-06-03 ENCOUNTER — Other Ambulatory Visit: Payer: Self-pay

## 2019-06-03 DIAGNOSIS — Z20822 Contact with and (suspected) exposure to covid-19: Secondary | ICD-10-CM | POA: Insufficient documentation

## 2019-06-03 DIAGNOSIS — Z01812 Encounter for preprocedural laboratory examination: Secondary | ICD-10-CM | POA: Insufficient documentation

## 2019-06-04 LAB — SARS CORONAVIRUS 2 (TAT 6-24 HRS): SARS Coronavirus 2: NEGATIVE

## 2019-06-06 ENCOUNTER — Ambulatory Visit (HOSPITAL_COMMUNITY)
Admission: RE | Admit: 2019-06-06 | Discharge: 2019-06-06 | Disposition: A | Discharge status: Home / Self Care | Payer: Medicare Other | Attending: Vascular Surgery | Admitting: Vascular Surgery

## 2019-06-06 ENCOUNTER — Encounter (HOSPITAL_COMMUNITY)
Admission: RE | Disposition: A | Discharge status: Home / Self Care | Payer: Self-pay | Source: Home / Self Care | Attending: Vascular Surgery

## 2019-06-06 ENCOUNTER — Other Ambulatory Visit: Payer: Self-pay

## 2019-06-06 DIAGNOSIS — Z7984 Long term (current) use of oral hypoglycemic drugs: Secondary | ICD-10-CM | POA: Insufficient documentation

## 2019-06-06 DIAGNOSIS — L97529 Non-pressure chronic ulcer of other part of left foot with unspecified severity: Secondary | ICD-10-CM | POA: Diagnosis not present

## 2019-06-06 DIAGNOSIS — Z89421 Acquired absence of other right toe(s): Secondary | ICD-10-CM | POA: Insufficient documentation

## 2019-06-06 DIAGNOSIS — I70262 Atherosclerosis of native arteries of extremities with gangrene, left leg: Secondary | ICD-10-CM | POA: Insufficient documentation

## 2019-06-06 DIAGNOSIS — I70213 Atherosclerosis of native arteries of extremities with intermittent claudication, bilateral legs: Secondary | ICD-10-CM | POA: Diagnosis not present

## 2019-06-06 DIAGNOSIS — E1151 Type 2 diabetes mellitus with diabetic peripheral angiopathy without gangrene: Secondary | ICD-10-CM | POA: Diagnosis not present

## 2019-06-06 DIAGNOSIS — Z79899 Other long term (current) drug therapy: Secondary | ICD-10-CM | POA: Insufficient documentation

## 2019-06-06 DIAGNOSIS — E11621 Type 2 diabetes mellitus with foot ulcer: Secondary | ICD-10-CM | POA: Insufficient documentation

## 2019-06-06 DIAGNOSIS — I10 Essential (primary) hypertension: Secondary | ICD-10-CM | POA: Diagnosis not present

## 2019-06-06 DIAGNOSIS — Z7982 Long term (current) use of aspirin: Secondary | ICD-10-CM | POA: Insufficient documentation

## 2019-06-06 DIAGNOSIS — Z87891 Personal history of nicotine dependence: Secondary | ICD-10-CM | POA: Diagnosis not present

## 2019-06-06 HISTORY — PX: ABDOMINAL AORTOGRAM W/LOWER EXTREMITY: CATH118223

## 2019-06-06 LAB — POCT I-STAT, CHEM 8
BUN: 9 mg/dL (ref 8–23)
Calcium, Ion: 1.27 mmol/L (ref 1.15–1.40)
Chloride: 99 mmol/L (ref 98–111)
Creatinine, Ser: 0.8 mg/dL (ref 0.61–1.24)
Glucose, Bld: 153 mg/dL — ABNORMAL HIGH (ref 70–99)
HCT: 40 % (ref 39.0–52.0)
Hemoglobin: 13.6 g/dL (ref 13.0–17.0)
Potassium: 3.9 mmol/L (ref 3.5–5.1)
Sodium: 139 mmol/L (ref 135–145)
TCO2: 30 mmol/L (ref 22–32)

## 2019-06-06 LAB — GLUCOSE, CAPILLARY: Glucose-Capillary: 114 mg/dL — ABNORMAL HIGH (ref 70–99)

## 2019-06-06 SURGERY — ABDOMINAL AORTOGRAM W/LOWER EXTREMITY
Anesthesia: LOCAL | Laterality: Bilateral

## 2019-06-06 MED ORDER — HYDRALAZINE HCL 20 MG/ML IJ SOLN
INTRAMUSCULAR | Status: AC
  Filled 2019-06-06: qty 1

## 2019-06-06 MED ORDER — LIDOCAINE HCL (PF) 1 % IJ SOLN
INTRAMUSCULAR | Status: AC
  Filled 2019-06-06: qty 30

## 2019-06-06 MED ORDER — FENTANYL CITRATE (PF) 100 MCG/2ML IJ SOLN
INTRAMUSCULAR | Status: AC
  Filled 2019-06-06: qty 2

## 2019-06-06 MED ORDER — HYDRALAZINE HCL 20 MG/ML IJ SOLN: 5.0000 mg | INTRAMUSCULAR | Status: DC | PRN

## 2019-06-06 MED ORDER — FENTANYL CITRATE (PF) 100 MCG/2ML IJ SOLN
INTRAMUSCULAR | Status: DC | PRN
  Administered 2019-06-06: 50 ug via INTRAVENOUS

## 2019-06-06 MED ORDER — LABETALOL HCL 5 MG/ML IV SOLN: 10.0000 mg | INTRAVENOUS | Status: DC | PRN

## 2019-06-06 MED ORDER — ACETAMINOPHEN 325 MG PO TABS: 650.0000 mg | ORAL_TABLET | ORAL | Status: DC | PRN

## 2019-06-06 MED ORDER — SODIUM CHLORIDE 0.9% FLUSH: 3.0000 mL | INTRAVENOUS | Status: DC | PRN

## 2019-06-06 MED ORDER — ONDANSETRON HCL 4 MG/2ML IJ SOLN: 4.0000 mg | Freq: Four times a day (QID) | INTRAMUSCULAR | Status: DC | PRN

## 2019-06-06 MED ORDER — SODIUM CHLORIDE 0.9% FLUSH: 3.0000 mL | Freq: Two times a day (BID) | INTRAVENOUS | Status: DC

## 2019-06-06 MED ORDER — MIDAZOLAM HCL 2 MG/2ML IJ SOLN
INTRAMUSCULAR | Status: DC | PRN
  Administered 2019-06-06: 1 mg via INTRAVENOUS

## 2019-06-06 MED ORDER — HEPARIN (PORCINE) IN NACL 1000-0.9 UT/500ML-% IV SOLN
INTRAVENOUS | Status: DC | PRN
  Administered 2019-06-06 (×2): 500 mL

## 2019-06-06 MED ORDER — MIDAZOLAM HCL 2 MG/2ML IJ SOLN
INTRAMUSCULAR | Status: AC
  Filled 2019-06-06: qty 2

## 2019-06-06 MED ORDER — HYDRALAZINE HCL 20 MG/ML IJ SOLN
INTRAMUSCULAR | Status: DC | PRN
  Administered 2019-06-06: 10 mg via INTRAVENOUS

## 2019-06-06 MED ORDER — SODIUM CHLORIDE 0.9 % IV SOLN: INTRAVENOUS | Status: DC

## 2019-06-06 MED ORDER — IODIXANOL 320 MG/ML IV SOLN
INTRAVENOUS | Status: DC | PRN
  Administered 2019-06-06: 97 mL via INTRA_ARTERIAL

## 2019-06-06 MED ORDER — SODIUM CHLORIDE 0.9 % IV SOLN: 250.0000 mL | INTRAVENOUS | Status: DC | PRN

## 2019-06-06 MED ORDER — HEPARIN (PORCINE) IN NACL 1000-0.9 UT/500ML-% IV SOLN
INTRAVENOUS | Status: AC
  Filled 2019-06-06: qty 500

## 2019-06-06 MED ORDER — LIDOCAINE HCL (PF) 1 % IJ SOLN
INTRAMUSCULAR | Status: DC | PRN
  Administered 2019-06-06: 15 mL via INTRADERMAL

## 2019-06-06 SURGICAL SUPPLY — 10 items
CATH OMNI FLUSH 5F 65CM (CATHETERS) ×1 IMPLANT
CLOSURE MYNX CONTROL 5F (Vascular Products) ×1 IMPLANT
KIT MICROPUNCTURE NIT STIFF (SHEATH) ×1 IMPLANT
KIT PV (KITS) ×2 IMPLANT
SHEATH PINNACLE 5F 10CM (SHEATH) ×1 IMPLANT
SHEATH PROBE COVER 6X72 (BAG) ×1 IMPLANT
SYR MEDRAD MARK V 150ML (SYRINGE) ×1 IMPLANT
TRANSDUCER W/STOPCOCK (MISCELLANEOUS) ×2 IMPLANT
TRAY PV CATH (CUSTOM PROCEDURE TRAY) ×2 IMPLANT
WIRE BENTSON .035X145CM (WIRE) ×1 IMPLANT

## 2019-06-06 NOTE — Op Note (Signed)
    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 Randie Heinz, MD Procedure Performed: 1.  Ultrasound-guided cannulation right common femoral artery 2.  Aortogram with bilateral lower extremity runoff 3.  Mynx device closure right common femoral artery 4.  Moderate sedation with fentanyl and Versed for 25 minutes   Indications: 64 year old male has undergone a right SFA to posterior tibial artery bypass now with transmetatarsal amputation that is healing.  He has stable gangrenous changes of the left second toe is indicated for angiography with possible invention.  Findings: Aortoiliac segments are free of flow-limiting stenosis.  Right side bypass is patent posterior tibial artery is the dominant runoff.  Left side above the knee popliteal artery occludes and reconstitutes after just a short segment.  TP trunk is quite diminutive runoff is via the posterior tibial artery.  Given the small size of the TP trunk elected patient would require SFA to posterior tibial artery bypass.  I evaluated his vein on the table does appear suitable in the calf and on the posterior thigh for bypass although in the mid thigh it does appear quite diminutive.   Procedure:  The patient was identified in the holding area and taken to room 8.  The patient was then placed supine on the table and prepped and draped in the usual sterile fashion.  A time out was called.  Ultrasound was used to evaluate the right common femoral artery which was noted to be patent.  The area was anesthetized 1% lidocaine cannulated with direct ultrasound visualization a micropuncture needle followed by wire sheath.  At the same time moderate sedation was administered with fentanyl and Versed and his vital signs were monitored throughout the case.  A Bentson wire was  placed followed by 5 French sheath and Omni catheter at the level of L1.  Aortogram was performed and the catheter was pulled down to the aortic bifurcation.  Bilateral lower extremity runoff was performed.  I extensively reviewed the images and elected the patient will require bypass surgery.  I evaluated his vein mapping as well as performed my own ultrasound-guided evaluation of the saphenous vein which appeared suitable below the knee and on the posterior thigh.  The cath and wire were removed.  Minx device was deployed.  He tolerated seizure without immediate complication.   Contrast: 97cc  Jenina Moening C. Randie Heinz, MD [facility] [place] Office: [phone] Pager: [phone]

## 2019-06-06 NOTE — Discharge Instructions (Signed)
Femoral Site Care This sheet gives you information about how to care for yourself after your procedure. Your health care provider may also give you more specific instructions. If you have problems or questions, contact your health care provider. What can I expect after the procedure? After the procedure, it is common to have:  Bruising that usually fades within 1-2 weeks.  Tenderness at the site. Follow these instructions at home: Wound care  Follow instructions from your health care provider about how to take care of your insertion site. Make sure you: ? Wash your hands with soap and water before you change your bandage (dressing). If soap and water are not available, use hand sanitizer. ? Change your dressing as told by your health care provider. ? Leave stitches (sutures), skin glue, or adhesive strips in place. These skin closures may need to stay in place for 2 weeks or longer. If adhesive strip edges start to loosen and curl up, you may trim the loose edges. Do not remove adhesive strips completely unless your health care provider tells you to do that.  Do not take baths, swim, or use a hot tub until your health care provider approves.  You may shower 24-48 hours after the procedure or as told by your health care provider. ? Gently wash the site with plain soap and water. ? Pat the area dry with a clean towel. ? Do not rub the site. This may cause bleeding.  Do not apply powder or lotion to the site. Keep the site clean and dry.  Check your femoral site every day for signs of infection. Check for: ? Redness, swelling, or pain. ? Fluid or blood. ? Warmth. ? Pus or a bad smell. Activity  For the first 2-3 days after your procedure, or as long as directed: ? Avoid climbing stairs as much as possible. ? Do not squat.  Do not lift anything that is heavier than 10 lb (4.5 kg), or the limit that you are told, until your health care provider says that it is safe.  Rest as  directed. ? Avoid sitting for a long time without moving. Get up to take short walks every 1-2 hours.  Do not drive for 24 hours if you were given a medicine to help you relax (sedative). General instructions  Take over-the-counter and prescription medicines only as told by your health care provider.  Keep all follow-up visits as told by your health care provider. This is important. Contact a health care provider if you have:  A fever or chills.  You have redness, swelling, or pain around your insertion site. Get help right away if:  The catheter insertion area swells very fast.  You pass out.  You suddenly start to sweat or your skin gets clammy.  The catheter insertion area is bleeding, and the bleeding does not stop when you hold steady pressure on the area.  The area near or just beyond the catheter insertion site becomes pale, cool, tingly, or numb. These symptoms may represent a serious problem that is an emergency. Do not wait to see if the symptoms will go away. Get medical help right away. Call your local emergency services (911 in the [place] Do not drive yourself to the hospital. Summary  After the procedure, it is common to have bruising that usually fades within 1-2 weeks.  Check your femoral site every day for signs of infection.  Do not lift anything that is heavier than 10 lb (4.5 kg), or the   limit that you are told, until your health care provider says that it is safe. This information is not intended to replace advice given to you by your health care provider. Make sure you discuss any questions you have with your health care provider. Document Revised: 02/09/2017 Document Reviewed: 02/09/2017 Elsevier Patient Education  2020 [employer]  

## 2019-06-06 NOTE — H&P (Signed)
HP    HPI 64 year old male initially underwent right SFA to posterior tibial artery bypass with vein.  He also had the first 2 toes amputated now has undergone transmetatarsal amputation.  This is healing well.  He has persistent ulceration of the left second toe.  Past Medical History:  Diagnosis Date  . Anxiety   . Diabetes mellitus   . Hypertension     Past Surgical History:  Procedure Laterality Date  . ABDOMINAL AORTOGRAM W/LOWER EXTREMITY N/A 04/05/2019   Procedure: ABDOMINAL AORTOGRAM W/LOWER EXTREMITY;  Surgeon: Elam Dutch, MD;  Location: [place] [facility];  Service: Cardiovascular;  Laterality: N/A;  . AMPUTATION Right 04/07/2019   Procedure: AMPUTATION DIGIT RIGHT FIRST AND SECOND TOES;  Surgeon: Waynetta Sandy, MD;  Location: [place];  Service: Vascular;  Laterality: Right;  . FEMORAL-TIBIAL BYPASS GRAFT Right 04/07/2019   Procedure: LEFT FEMORAL POSTERIOR TIBIAL BYPASS;  Surgeon: Waynetta Sandy, MD;  Location: [place];  Service: Vascular;  Laterality: Right;  . HERNIA REPAIR    . TRANSMETATARSAL AMPUTATION Right 04/28/2019   Right transmetatarsal amputation  . TRANSMETATARSAL AMPUTATION Right 04/28/2019   Procedure: TRANSMETATARSAL AMPUTATION;  Surgeon: Waynetta Sandy, MD;  Location: [place];  Service: Vascular;  Laterality: Right;    No Known Allergies  Prior to Admission medications   Medication Sig Start Date End Date Taking? Authorizing Provider  amLODipine-benazepril (LOTREL) 5-10 MG per capsule Take 1 capsule by mouth daily.   Yes [provider]  ammonium lactate (AMLACTIN) 12 % cream Apply topically as needed for dry skin. Apply only to the heel, not to an open wound 05/03/19  Yes Trula Slade, DPM  aspirin 325 MG tablet Take 325 mg by mouth daily.   Yes [provider]  glipiZIDE (GLUCOTROL) 5 MG tablet Take 1 tablet (5 mg total) by mouth daily before breakfast. 04/09/19  Yes Sheikh, Omair Latif, DO  iron  polysaccharides (NIFEREX) 150 MG capsule Take 1 capsule (150 mg total) by mouth daily. 04/22/19  Yes Waynetta Sandy, MD  metFORMIN (GLUCOPHAGE) 1000 MG tablet Take 1 tablet (1,000 mg total) by mouth 2 (two) times daily with a meal. 04/09/19 05/27/19 Yes Sheikh, Omair Latif, DO  nicotine (NICODERM CQ - DOSED IN MG/24 HOURS) 14 mg/24hr patch Place 14 mg onto the skin daily.   Yes [provider]  oxyCODONE (OXY IR/ROXICODONE) 5 MG immediate release tablet Take 1 tablet (5 mg total) by mouth every 4 (four) hours as needed for moderate pain. 04/30/19  Yes Rhyne, Samantha J, PA-C  pantoprazole (PROTONIX) 40 MG tablet Take 1 tablet (40 mg total) by mouth daily. 04/10/19  Yes Sheikh, Omair Latif, DO  rosuvastatin (CRESTOR) 10 MG tablet Take 1 tablet (10 mg total) by mouth daily at 6 PM. 04/09/19  Yes Alfredia Ferguson, Omair Latif, DO  aspirin EC 81 MG EC tablet Take 1 tablet (81 mg total) by mouth daily. Patient not taking: Reported on 05/27/2019 04/10/19   Raiford Noble Latif, DO  blood glucose meter kit and supplies KIT Dispense based on patient and insurance preference. Use up to four times daily as directed. (FOR ICD-9 250.00, 250.01). 04/09/19   Raiford Noble Latif, DO  docusate sodium (COLACE) 100 MG capsule Take 1 capsule (100 mg total) by mouth daily. Patient not taking: Reported on 05/27/2019 04/10/19   Raiford Noble Latif, DO  nicotine (NICODERM CQ - DOSED IN MG/24 HOURS) 21 mg/24hr patch Place 1 patch (21 mg total) onto the skin daily.  Patient not taking: Reported on 05/27/2019 04/10/19   Kerney Elbe, DO    Social History   Socioeconomic History  . Marital status: Married    Spouse name: Not on file  . Number of children: Not on file  . Years of education: Not on file  . Highest education level: Not on file  Occupational History  . Not on file  Tobacco Use  . Smoking status: Former Smoker    Packs/day: 1.00  . Smokeless tobacco: Never Used  . Tobacco comment: Hasn't smoked since  his surgery  Substance and Sexual Activity  . Alcohol use: Not Currently    Comment: occ  . Drug use: Never  . Sexual activity: Not on file  Other Topics Concern  . Not on file  Social History Narrative  . Not on file   Social Determinants of Health   Financial Resource Strain:   . Difficulty of Paying Living Expenses:   Food Insecurity:   . Worried About Charity fundraiser in the Last Year:   . Arboriculturist in the Last Year:   Transportation Needs:   . Film/video editor (Medical):   Marland Kitchen Lack of Transportation (Non-Medical):   Physical Activity:   . Days of Exercise per Week:   . Minutes of Exercise per Session:   Stress:   . Feeling of Stress :   Social Connections:   . Frequency of Communication with Friends and Family:   . Frequency of Social Gatherings with Friends and Family:   . Attends Religious Services:   . Active Member of Clubs or Organizations:   . Attends Archivist Meetings:   Marland Kitchen Marital Status:   Intimate Partner Violence:   . Fear of Current or Ex-Partner:   . Emotionally Abused:   Marland Kitchen Physically Abused:   . Sexually Abused:      No family history on file.  ROS:  Healing right transmetatarsal amputation, left second toe ulceration  Physical Examination  Vitals:   06/06/19 1036  BP: (!) 161/94  Pulse: 84  Resp: 17  Temp: 97.7 F (36.5 C)  SpO2: 100%   Body mass index is 21.43 kg/m.  Awake alert oriented Nonlabored respirations Palpable right posterior tibial artery pulse Monophasic left posterior tibial artery signal Healing right transmetatarsal amputation Left second toe with dark discoloration and gangrenous changes without evidence of infection.  CBC    Component Value Date/Time   WBC 5.8 04/28/2019 0807   RBC 4.24 04/28/2019 0807   HGB 13.6 06/06/2019 1053   HCT 40.0 06/06/2019 1053   HCT 44 (A) 11/14/2015 0758   PLT 286 04/28/2019 0807   MCV 84.9 04/28/2019 0807   MCV 87.8 11/14/2015 0758   MCH 26.7  04/28/2019 0807   MCHC 31.4 04/28/2019 0807   RDW 12.6 04/28/2019 0807   RDW 13.7 11/14/2015 0758   LYMPHSABS 1.0 04/09/2019 0820   MONOABS 0.7 04/09/2019 0820   EOSABS 0.1 04/09/2019 0820   EOSABS 180 11/14/2015 0758   BASOSABS 0.0 04/09/2019 0820    BMET    Component Value Date/Time   NA 139 06/06/2019 1053   NA 137 11/14/2015 0758   K 3.9 06/06/2019 1053   K 4.1 11/14/2015 0758   CL 99 06/06/2019 1053   CL 102 11/14/2015 0758   CO2 26 04/28/2019 0807   CO2 26 11/14/2015 0758   GLUCOSE 153 (H) 06/06/2019 1053   BUN 9 06/06/2019 1053   CREATININE 0.80 06/06/2019 1053  CREATININE 1.03 11/14/2015 0758   CALCIUM 9.2 04/28/2019 0807   CALCIUM 9.1 11/14/2015 0758   GFRNONAA >60 04/28/2019 0807   GFRAA >60 04/28/2019 0807    COAGS: Lab Results  Component Value Date   INR 1.0 04/28/2019      ASSESSMENT/PLAN:  64 year old male status post right lower extremity revascularization now transmetatarsal amputation which is healing well.  Sutures removed today.  We will plan for angiography possible endovascular invention of the left lower extremity popliteal artery.  He is followed by Dr. Jacqualyn Posey with podiatry and may need second toe amputation in the future.   Joleen Stuckert C. Donzetta Matters, MD [facility] Office: [phone] Pager: [phone]

## 2019-06-06 NOTE — Progress Notes (Signed)
Patient and daughter was given discharge instructions. Both verbalized understanding. 

## 2019-06-09 ENCOUNTER — Ambulatory Visit: Payer: Medicaid Other | Admitting: Podiatry

## 2019-06-09 ENCOUNTER — Other Ambulatory Visit: Payer: Self-pay

## 2019-06-14 ENCOUNTER — Other Ambulatory Visit: Payer: Self-pay

## 2019-06-14 ENCOUNTER — Other Ambulatory Visit (HOSPITAL_COMMUNITY)
Admission: RE | Admit: 2019-06-14 | Discharge: 2019-06-14 | Disposition: A | Discharge status: Home / Self Care | Payer: Medicare Other | Source: Ambulatory Visit | Attending: Vascular Surgery | Admitting: Vascular Surgery

## 2019-06-14 DIAGNOSIS — Z01812 Encounter for preprocedural laboratory examination: Secondary | ICD-10-CM | POA: Insufficient documentation

## 2019-06-14 DIAGNOSIS — U071 COVID-19: Secondary | ICD-10-CM | POA: Insufficient documentation

## 2019-06-15 ENCOUNTER — Encounter (HOSPITAL_COMMUNITY): Payer: Self-pay | Admitting: Vascular Surgery

## 2019-06-15 LAB — SARS CORONAVIRUS 2 (TAT 6-24 HRS): SARS Coronavirus 2: POSITIVE — AB

## 2019-06-15 NOTE — Progress Notes (Signed)
Dr. Randie Heinz notified of patient's covid results.  Carollee Herter at Dr. Darcella Cheshire office was also notified and stated that she would be contacting patient and the OR desk.

## 2019-06-16 ENCOUNTER — Telehealth: Payer: Self-pay | Admitting: Adult Health

## 2019-06-16 NOTE — Telephone Encounter (Signed)
Called to discuss with Juan Turner about Covid symptoms and the use of bamlanivimab, a monoclonal antibody infusion for those with mild to moderate Covid symptoms and at a high risk of hospitalization.   Unable to reach , left voicemail to return call to infusion center hotline .   Rubye Oaks NP- C

## 2019-06-16 NOTE — Telephone Encounter (Signed)
Called and [facility] for patient to return my call about monoclonal antibody therapy as he meets criteria due to his age.  Call back number given.    Lillard Anes, NP

## 2019-06-20 ENCOUNTER — Other Ambulatory Visit (HOSPITAL_COMMUNITY): Payer: Medicaid Other

## 2019-06-29 ENCOUNTER — Encounter (HOSPITAL_COMMUNITY): Payer: Self-pay | Admitting: Vascular Surgery

## 2019-06-29 NOTE — Progress Notes (Addendum)
Patient denies shortness of breath, fever, cough or chest pain.  PCP - [facility] [place] Cardiologist - n/a Podiatry - Dr Ardelle Anton   Chest x-ray - n/a EKG - 04/05/19 Stress Test - n/a ECHO - 05/24/19 [facility] Cardiac Cath - n/a  Fasting Blood Sugar - 140-200s Checks Blood Sugar _1_ times a day  . Do not take oral diabetes medicines (Metformin, Glipizide) the morning of surgery.  . If your blood sugar is less than 70 mg/dL, you will need to treat for low blood sugar: o Treat a low blood sugar (less than 70 mg/dL) with  cup of clear juice (cranberry or apple), 4 glucose tablets, OR glucose gel. o Recheck blood sugar in 15 minutes after treatment (to make sure it is greater than 70 mg/dL). If your blood sugar is not greater than 70 mg/dL on recheck, call [phone] for further instructions.  Aspirin Instructions: Continue ASA per MD.  Anesthesia review: Yes  STOP now taking any Aspirin (unless otherwise instructed by your surgeon), Aleve, Naproxen, Ibuprofen, Motrin, Advil, Goody's, BC's, all herbal medications, fish oil, and all vitamins.   Coronavirus Screening Postitve covid test on 06/14/19.   Do you have any of the following symptoms:  Cough yes/no: No Fever (>100.70F)  yes/no: No Runny nose yes/no: No Sore throat yes/no: No Difficulty breathing/shortness of breath  yes/no: No  Have you traveled in the last 14 days and where? yes/no: No  Patient/Wife Elease Hashimoto verbalized understanding of instructions that were given via phone.

## 2019-06-29 NOTE — Anesthesia Preprocedure Evaluation (Addendum)
Anesthesia Evaluation  Patient identified by MRN, date of birth, ID band Patient awake    Reviewed: Allergy & Precautions, NPO status , Patient's Chart, lab work & pertinent test results  Airway Mallampati: III  TM Distance: >3 FB Neck ROM: Full    Dental no notable dental hx. (+) Dental Advisory Given, Missing, Poor Dentition,    Pulmonary former smoker,    Pulmonary exam normal breath sounds clear to auscultation       Cardiovascular hypertension, Pt. on medications + Peripheral Vascular Disease   Rhythm:Regular Rate:Normal + Systolic murmurs Echo 7/61/95 ([place]): Summary  1. The left ventricle is normal in size with upper normal wall thickness.  2. The left ventricular systolic function is normal, LVEF is visually  estimated at 60-65%.  3. There is grade I diastolic dysfunction (impaired relaxation).  4. The mitral valve leaflets are mildly thickened with normal leaflet  mobility.  5. There is mild mitral valve regurgitation.  6. There is moderate to severe aortic valve stenosis.Gradients and velocities are underestimated. DI 0.28 - 0.30, AVA 1.0 - 1.1  cm2.  There is no significant aortic regurgitation.  Peak AV transvalvular velocity: 2.9 m/s.  Mean gradient: 15 mmHg.  Doppler velocity index: 0.28.  Estimated aortic valve area (VTI): 1.0 cm2.  Estimated aortic valve area (velocity): 1.0 cm2.  LVOT diameter: 2.1 cm.  LV stroke volume index: 24.9 ml/m2.  7. The right ventricle is normal in size, with normal systolic function.     Neuro/Psych PSYCHIATRIC DISORDERS Anxiety negative neurological ROS     GI/Hepatic Neg liver ROS, GERD  ,  Endo/Other  diabetes, Oral Hypoglycemic Agents  Renal/GU negative Renal ROS     Musculoskeletal negative musculoskeletal ROS (+)   Abdominal   Peds  Hematology  (+) Blood dyscrasia, anemia ,   Anesthesia Other Findings    Reproductive/Obstetrics negative OB ROS                                                            Anesthesia Evaluation  Patient identified by MRN, date of birth, ID band Patient awake    Reviewed: Allergy & Precautions, NPO status , Patient's Chart, lab work & pertinent test results  History of Anesthesia Complications Negative for: history of anesthetic complications  Airway Mallampati: II  TM Distance: >3 FB Neck ROM: Full    Dental  (+) Dental Advisory Given   Pulmonary neg recent URI, Current Smoker and Patient abstained from smoking., former smoker,    breath sounds clear to auscultation       Cardiovascular hypertension, Pt. on medications + Peripheral Vascular Disease   Rhythm:Regular Rate:Normal     Neuro/Psych PSYCHIATRIC DISORDERS Anxiety negative neurological ROS     GI/Hepatic negative GI ROS, Neg liver ROS,   Endo/Other  diabetes, Type 2  Renal/GU negative Renal ROS     Musculoskeletal   Abdominal   Peds  Hematology  (+) Blood dyscrasia, anemia ,   Anesthesia Other Findings   Reproductive/Obstetrics                             Anesthesia Physical Anesthesia Plan  ASA: III  Anesthesia Plan: MAC and Regional   Post-op Pain Management:    Induction:  Intravenous  PONV Risk Score and Plan: 1 and Treatment may vary due to age or medical condition and Propofol infusion  Airway Management Planned: Nasal Cannula  Additional Equipment: None  Intra-op Plan:   Post-operative Plan:   Informed Consent: I have reviewed the patients History and Physical, chart, labs and discussed the procedure including the risks, benefits and alternatives for the proposed anesthesia with the patient or authorized representative who has indicated his/her understanding and acceptance.     Dental advisory given  Plan Discussed with: CRNA and Surgeon  Anesthesia Plan Comments:          Anesthesia Quick Evaluation  Anesthesia Physical Anesthesia Plan  ASA: IV  Anesthesia Plan: General   Post-op Pain Management:    Induction: Intravenous  PONV Risk Score and Plan: 3 and Ondansetron, Dexamethasone and Treatment may vary due to age or medical condition  Airway Management Planned: Oral ETT  Additional Equipment: Arterial line  Intra-op Plan:   Post-operative Plan: Extubation in OR  Informed Consent: I have reviewed the patients History and Physical, chart, labs and discussed the procedure including the risks, benefits and alternatives for the proposed anesthesia with the patient or authorized representative who has indicated his/her understanding and acceptance.     Dental advisory given  Plan Discussed with: CRNA  Anesthesia Plan Comments: (PAT note written 06/29/2019 by Myra Gianotti, PA-C. Delayed after + COVID-19 test 06/14/19. Moderate-severe AS on 05/2019 echo.   Needs a-line.  )      Anesthesia Quick Evaluation

## 2019-06-29 NOTE — Progress Notes (Signed)
Anesthesia Chart Review:  Case: [id number] Date/Time: 06/30/19 0715   Procedures:      LEFT BYPASS GRAFTSUPERFICIAL FEMORAL-TIBIAL ARTERY (Left )     LEFT SECOND TOE AMPUTATION (Left )   Anesthesia type: General   Pre-op diagnosis: PERIPHERAL ARTERY DISEASE   Location: [facility] ROOM 11 / [facility]   Surgeons: Waynetta Sandy, MD      DISCUSSION: Patient is a 64 year old male scheduled for the above procedure. Appears case was scheduled earlier this month, but delayed due to + COVID-19 test on 06/14/19. He was admitted in 03/2019 for right foot gangrene and critical RLE ischemia. Arteriogram showed bilateral popliteal artery occlusions. He underwent right 1st and 2nd toes amputations and right SFA-PTA bypass using right GSV graft on 04/07/19. He later underwent right transmetatarsal amputation on 04/28/19. He also has some gangrenous changes to his left 2nd toe and the above procedures recommended.   History includes former smoker, DM2, HTN, anxiety, GERD, anemia, HLD, PAD. He had an echo on 05/24/19 (ordered by PCP Boles, Alyssa H, PA-C) for murmur evaluation that showed moderate to severe AS. I spoke with his PCP. She ordered echo for incidental finding of murmur on initial exam to establish care with her (and not for acute CV symptoms). She had not actually received a copy of the echo report yet, so results discussed with her. She will follow-up with him regarding future AS follow-up (consider serial echocardiogram versus referral for cardiology to follow). In the interim, echo report reviewed with anesthesiologist Roberts Gaudy, MD. AS not critical by measurements. Patient tolerated similar surgery a few months ago. If asymptomatic from AS, then would anticipate he could proceed as planned with future out-patient AS follow-up.  He will need an a-line.   He denied shortness of breath, cough, fever, chest pain per PAT phone RN interview.  He reported feeling recovered from COVID-19. Since positive test  within past 90 days, he will not need a repeat preoperative COVID-19 test.   Definitive plan following anesthesia team evaluation on the day of surgery.   VS: Ht 6' (1.829 m)   Wt 72.6 kg   BMI 21.70 kg/m   BP Readings from Last 3 Encounters:  06/06/19 (!) 161/78  05/27/19 (!) 142/89  04/30/19 122/74    PROVIDERS: Wanita Chamberlain, PA-C is PCP ([place]).    LABS: For day of surgery. Last labs showed Cr 0.80 and H/H (ISTAT) 13.6/40.0 on 06/06/19. A1c 9.6% 04/04/19.     EKG: EKG 04/04/19: Sinus rhythm Consider left ventricular hypertrophy Anterior Q waves, possibly due to LVH Baseline wander in lead(s) III aVF No old tracing to compare Confirmed by Isla Pence [phone]) on 04/05/2019 6:59:19 PM   CV: Aortogram with BLE runoff 06/06/19: Findings: Aortoiliac segments are free of flow-limiting stenosis.  Right side bypass is patent posterior tibial artery is the dominant runoff.  Left side above the knee popliteal artery occludes and reconstitutes after just a short segment.  TP trunk is quite diminutive runoff is via the posterior tibial artery.  Given the small size of the TP trunk elected patient would require SFA to posterior tibial artery bypass.  I evaluated his vein on the table does appear suitable in the calf and on the posterior thigh for bypass although in the mid thigh it does appear quite diminutive.   Echo 05/24/19 ([place]): Summary  1. The left ventricle is normal in size with upper normal wall thickness.  2. The  left ventricular systolic function is normal, LVEF is visually  estimated at 60-65%.  3. There is grade I diastolic dysfunction (impaired relaxation).  4. The mitral valve leaflets are mildly thickened with normal leaflet  mobility.  5. There is mild mitral valve regurgitation.  6. There is moderate to severe aortic valve stenosis.Gradients and velocities are underestimated. DI 0.28 - 0.30, AVA 1.0 -  1.1  cm2.  There is no significant aortic regurgitation.  Peak AV transvalvular velocity: 2.9 m/s.  Mean gradient: 15 mmHg.  Doppler velocity index: 0.28.  Estimated aortic valve area (VTI): 1.0 cm2.  Estimated aortic valve area (velocity): 1.0 cm2.  LVOT diameter: 2.1 cm.  LV stroke volume index: 24.9 ml/m2.  7. The right ventricle is normal in size, with normal systolic function.     Past Medical History:  Diagnosis Date  . Anemia   . Anxiety   . Aortic stenosis   . Diabetes mellitus   . GERD (gastroesophageal reflux disease)   . HLD (hyperlipidemia)   . Hypertension   . Smoker     Past Surgical History:  Procedure Laterality Date  . ABDOMINAL AORTOGRAM W/LOWER EXTREMITY N/A 04/05/2019   Procedure: ABDOMINAL AORTOGRAM W/LOWER EXTREMITY;  Surgeon: Elam Dutch, MD;  Location: [place] [facility];  Service: Cardiovascular;  Laterality: N/A;  . ABDOMINAL AORTOGRAM W/LOWER EXTREMITY Bilateral 06/06/2019   Procedure: ABDOMINAL AORTOGRAM W/LOWER EXTREMITY;  Surgeon: Waynetta Sandy, MD;  Location: [place] [facility];  Service: Cardiovascular;  Laterality: Bilateral;  . AMPUTATION Right 04/07/2019   Procedure: AMPUTATION DIGIT RIGHT FIRST AND SECOND TOES;  Surgeon: Waynetta Sandy, MD;  Location: [place];  Service: Vascular;  Laterality: Right;  . FEMORAL-TIBIAL BYPASS GRAFT Right 04/07/2019   Procedure: LEFT FEMORAL POSTERIOR TIBIAL BYPASS;  Surgeon: Waynetta Sandy, MD;  Location: [place];  Service: Vascular;  Laterality: Right;  . HERNIA REPAIR    . TRANSMETATARSAL AMPUTATION Right 04/28/2019   Right transmetatarsal amputation  . TRANSMETATARSAL AMPUTATION Right 04/28/2019   Procedure: TRANSMETATARSAL AMPUTATION;  Surgeon: Waynetta Sandy, MD;  Location: [place];  Service: Vascular;  Laterality: Right;    MEDICATIONS: No current facility-administered medications for this encounter.   Marland Kitchen amLODipine-benazepril (LOTREL) 5-10  MG per capsule  . ammonium lactate (AMLACTIN) 12 % cream  . aspirin 325 MG tablet  . aspirin EC 81 MG EC tablet  . blood glucose meter kit and supplies KIT  . docusate sodium (COLACE) 100 MG capsule  . glipiZIDE (GLUCOTROL) 5 MG tablet  . iron polysaccharides (NIFEREX) 150 MG capsule  . metFORMIN (GLUCOPHAGE) 1000 MG tablet  . nicotine (NICODERM CQ - DOSED IN MG/24 HOURS) 14 mg/24hr patch  . nicotine (NICODERM CQ - DOSED IN MG/24 HOURS) 21 mg/24hr patch  . oxyCODONE (OXY IR/ROXICODONE) 5 MG immediate release tablet  . pantoprazole (PROTONIX) 40 MG tablet  . rosuvastatin (CRESTOR) 10 MG tablet     Myra Gianotti, PA-C Surgical Short Stay/Anesthesiology [facility] Phone [phone] [facility] Phone [phone] 06/29/2019 3:13 PM

## 2019-06-30 ENCOUNTER — Encounter (HOSPITAL_COMMUNITY): Payer: Self-pay | Admitting: Vascular Surgery

## 2019-06-30 ENCOUNTER — Other Ambulatory Visit: Payer: Self-pay

## 2019-06-30 ENCOUNTER — Inpatient Hospital Stay (HOSPITAL_COMMUNITY)
Admission: RE | Admit: 2019-06-30 | Discharge: 2019-07-02 | DRG: 253 | Disposition: A | Discharge status: Home / Self Care | Payer: Medicare Other | Attending: Vascular Surgery | Admitting: Vascular Surgery

## 2019-06-30 ENCOUNTER — Encounter (HOSPITAL_COMMUNITY)
Admission: RE | Disposition: A | Discharge status: Home / Self Care | Payer: Self-pay | Source: Home / Self Care | Attending: Vascular Surgery

## 2019-06-30 ENCOUNTER — Inpatient Hospital Stay (HOSPITAL_COMMUNITY): Payer: Medicare Other | Admitting: Physician Assistant

## 2019-06-30 DIAGNOSIS — I96 Gangrene, not elsewhere classified: Secondary | ICD-10-CM | POA: Diagnosis not present

## 2019-06-30 DIAGNOSIS — E785 Hyperlipidemia, unspecified: Secondary | ICD-10-CM | POA: Diagnosis not present

## 2019-06-30 DIAGNOSIS — Z79891 Long term (current) use of opiate analgesic: Secondary | ICD-10-CM | POA: Diagnosis not present

## 2019-06-30 DIAGNOSIS — L97529 Non-pressure chronic ulcer of other part of left foot with unspecified severity: Secondary | ICD-10-CM | POA: Diagnosis not present

## 2019-06-30 DIAGNOSIS — I340 Nonrheumatic mitral (valve) insufficiency: Secondary | ICD-10-CM | POA: Diagnosis present

## 2019-06-30 DIAGNOSIS — Z89421 Acquired absence of other right toe(s): Secondary | ICD-10-CM

## 2019-06-30 DIAGNOSIS — E1152 Type 2 diabetes mellitus with diabetic peripheral angiopathy with gangrene: Secondary | ICD-10-CM | POA: Diagnosis not present

## 2019-06-30 DIAGNOSIS — I10 Essential (primary) hypertension: Secondary | ICD-10-CM | POA: Diagnosis present

## 2019-06-30 DIAGNOSIS — Z7982 Long term (current) use of aspirin: Secondary | ICD-10-CM

## 2019-06-30 DIAGNOSIS — Z89411 Acquired absence of right great toe: Secondary | ICD-10-CM

## 2019-06-30 DIAGNOSIS — Z87891 Personal history of nicotine dependence: Secondary | ICD-10-CM

## 2019-06-30 DIAGNOSIS — Z79899 Other long term (current) drug therapy: Secondary | ICD-10-CM

## 2019-06-30 DIAGNOSIS — I350 Nonrheumatic aortic (valve) stenosis: Secondary | ICD-10-CM | POA: Diagnosis not present

## 2019-06-30 DIAGNOSIS — I70245 Atherosclerosis of native arteries of left leg with ulceration of other part of foot: Secondary | ICD-10-CM | POA: Diagnosis present

## 2019-06-30 DIAGNOSIS — Z8616 Personal history of COVID-19: Secondary | ICD-10-CM | POA: Diagnosis not present

## 2019-06-30 DIAGNOSIS — I739 Peripheral vascular disease, unspecified: Secondary | ICD-10-CM | POA: Diagnosis present

## 2019-06-30 DIAGNOSIS — E11621 Type 2 diabetes mellitus with foot ulcer: Secondary | ICD-10-CM | POA: Diagnosis present

## 2019-06-30 DIAGNOSIS — Z7984 Long term (current) use of oral hypoglycemic drugs: Secondary | ICD-10-CM

## 2019-06-30 DIAGNOSIS — E1151 Type 2 diabetes mellitus with diabetic peripheral angiopathy without gangrene: Secondary | ICD-10-CM | POA: Diagnosis present

## 2019-06-30 DIAGNOSIS — K219 Gastro-esophageal reflux disease without esophagitis: Secondary | ICD-10-CM | POA: Diagnosis present

## 2019-06-30 HISTORY — PX: FEMORAL-TIBIAL BYPASS GRAFT: SHX938

## 2019-06-30 HISTORY — DX: Anemia, unspecified: D64.9

## 2019-06-30 HISTORY — DX: Gastro-esophageal reflux disease without esophagitis: K21.9

## 2019-06-30 HISTORY — DX: Nonrheumatic aortic (valve) stenosis: I35.0

## 2019-06-30 LAB — CBC
HCT: 40.1 % (ref 39.0–52.0)
Hemoglobin: 12.1 g/dL — ABNORMAL LOW (ref 13.0–17.0)
MCH: 25 pg — ABNORMAL LOW (ref 26.0–34.0)
MCHC: 30.2 g/dL (ref 30.0–36.0)
MCV: 82.9 fL (ref 80.0–100.0)
Platelets: 229 10*3/uL (ref 150–400)
RBC: 4.84 MIL/uL (ref 4.22–5.81)
RDW: 14.9 % (ref 11.5–15.5)
WBC: 5.8 10*3/uL (ref 4.0–10.5)
nRBC: 0 % (ref 0.0–0.2)

## 2019-06-30 LAB — TYPE AND SCREEN
ABO/RH(D): B POS
Antibody Screen: NEGATIVE

## 2019-06-30 LAB — APTT: aPTT: 30 seconds (ref 24–36)

## 2019-06-30 LAB — URINALYSIS, ROUTINE W REFLEX MICROSCOPIC
Bilirubin Urine: NEGATIVE
Glucose, UA: NEGATIVE mg/dL
Hgb urine dipstick: NEGATIVE
Ketones, ur: NEGATIVE mg/dL
Leukocytes,Ua: NEGATIVE
Nitrite: NEGATIVE
Protein, ur: NEGATIVE mg/dL
Specific Gravity, Urine: 1.016 (ref 1.005–1.030)
pH: 6 (ref 5.0–8.0)

## 2019-06-30 LAB — GLUCOSE, CAPILLARY
Glucose-Capillary: 144 mg/dL — ABNORMAL HIGH (ref 70–99)
Glucose-Capillary: 163 mg/dL — ABNORMAL HIGH (ref 70–99)
Glucose-Capillary: 175 mg/dL — ABNORMAL HIGH (ref 70–99)
Glucose-Capillary: 329 mg/dL — ABNORMAL HIGH (ref 70–99)
Glucose-Capillary: 306 mg/dL — ABNORMAL HIGH (ref 70–99)

## 2019-06-30 LAB — COMPREHENSIVE METABOLIC PANEL
ALT: 25 U/L (ref 0–44)
AST: 23 U/L (ref 15–41)
Albumin: 3.8 g/dL (ref 3.5–5.0)
Alkaline Phosphatase: 93 U/L (ref 38–126)
Anion gap: 9 (ref 5–15)
BUN: 10 mg/dL (ref 8–23)
CO2: 27 mmol/L (ref 22–32)
Calcium: 9.3 mg/dL (ref 8.9–10.3)
Chloride: 103 mmol/L (ref 98–111)
Creatinine, Ser: 0.8 mg/dL (ref 0.61–1.24)
GFR calc Af Amer: >60 mL/min (ref >60)
GFR calc non Af Amer: >60 mL/min (ref >60)
Glucose, Bld: 160 mg/dL — ABNORMAL HIGH (ref 70–99)
Potassium: 4.2 mmol/L (ref 3.5–5.1)
Sodium: 139 mmol/L (ref 135–145)
Total Bilirubin: 0.4 mg/dL (ref 0.3–1.2)
Total Protein: 7.5 g/dL (ref 6.5–8.1)

## 2019-06-30 LAB — HEMOGLOBIN A1C
Hgb A1c MFr Bld: 7.7 % — ABNORMAL HIGH (ref 4.8–5.6)
Mean Plasma Glucose: 174.29 mg/dL

## 2019-06-30 LAB — PROTIME-INR
INR: 0.9 (ref 0.8–1.2)
Prothrombin Time: 12 seconds (ref 11.4–15.2)

## 2019-06-30 SURGERY — CREATION, BYPASS, ARTERIAL, FEMORAL TO TIBIAL, USING GRAFT
Anesthesia: General | Site: Leg Lower | Laterality: Left

## 2019-06-30 MED ORDER — ONDANSETRON HCL 4 MG/2ML IJ SOLN: 4.0000 mg | Freq: Four times a day (QID) | INTRAMUSCULAR | Status: DC | PRN

## 2019-06-30 MED ORDER — INSULIN ASPART 100 UNIT/ML ~~LOC~~ SOLN
0.0000 [IU] | Freq: Three times a day (TID) | SUBCUTANEOUS | Status: DC
  Administered 2019-06-30: 11 [IU] via SUBCUTANEOUS
  Administered 2019-07-01: 5 [IU] via SUBCUTANEOUS
  Administered 2019-07-01 (×2): 2 [IU] via SUBCUTANEOUS
  Administered 2019-07-02: 5 [IU] via SUBCUTANEOUS

## 2019-06-30 MED ORDER — GLIPIZIDE 5 MG PO TABS
5.0000 mg | ORAL_TABLET | Freq: Every day | ORAL | Status: DC
  Administered 2019-07-01 – 2019-07-02 (×2): 5 mg via ORAL
  Filled 2019-06-30 (×2): qty 1

## 2019-06-30 MED ORDER — POTASSIUM CHLORIDE CRYS ER 20 MEQ PO TBCR: 20.0000 meq | EXTENDED_RELEASE_TABLET | Freq: Every day | ORAL | Status: DC | PRN

## 2019-06-30 MED ORDER — HYDRALAZINE HCL 20 MG/ML IJ SOLN: 5.0000 mg | INTRAMUSCULAR | Status: DC | PRN

## 2019-06-30 MED ORDER — ROCURONIUM BROMIDE 10 MG/ML (PF) SYRINGE
PREFILLED_SYRINGE | INTRAVENOUS | Status: DC | PRN
  Administered 2019-06-30: 60 mg via INTRAVENOUS
  Administered 2019-06-30: 10 mg via INTRAVENOUS

## 2019-06-30 MED ORDER — ACETAMINOPHEN 325 MG PO TABS
325.0000 mg | ORAL_TABLET | ORAL | Status: DC | PRN
  Administered 2019-07-01: 650 mg via ORAL
  Filled 2019-06-30: qty 2

## 2019-06-30 MED ORDER — ACETAMINOPHEN 650 MG RE SUPP: 325.0000 mg | RECTAL | Status: DC | PRN

## 2019-06-30 MED ORDER — PANTOPRAZOLE SODIUM 40 MG PO TBEC
40.0000 mg | DELAYED_RELEASE_TABLET | Freq: Every day | ORAL | Status: DC
  Administered 2019-07-01 – 2019-07-02 (×2): 40 mg via ORAL
  Filled 2019-06-30 (×2): qty 1

## 2019-06-30 MED ORDER — PHENYLEPHRINE HCL (PRESSORS) 10 MG/ML IV SOLN
INTRAVENOUS | Status: DC | PRN
Start: 2019-06-30 — End: 2019-06-30
  Administered 2019-06-30: 80 ug via INTRAVENOUS
  Administered 2019-06-30 (×6): 40 ug via INTRAVENOUS

## 2019-06-30 MED ORDER — PHENOL 1.4 % MT LIQD: 1.0000 | OROMUCOSAL | Status: DC | PRN

## 2019-06-30 MED ORDER — HEPARIN SODIUM (PORCINE) 5000 UNIT/ML IJ SOLN
5000.0000 [IU] | Freq: Three times a day (TID) | INTRAMUSCULAR | Status: DC
  Administered 2019-07-01 – 2019-07-02 (×4): 5000 [IU] via SUBCUTANEOUS
  Filled 2019-06-30 (×4): qty 1

## 2019-06-30 MED ORDER — MEPERIDINE HCL 25 MG/ML IJ SOLN: 6.2500 mg | INTRAMUSCULAR | Status: DC | PRN

## 2019-06-30 MED ORDER — MIDAZOLAM HCL 5 MG/5ML IJ SOLN
INTRAMUSCULAR | Status: DC | PRN
  Administered 2019-06-30: 2 mg via INTRAVENOUS

## 2019-06-30 MED ORDER — ZOLPIDEM TARTRATE 5 MG PO TABS: 5.0000 mg | ORAL_TABLET | Freq: Every evening | ORAL | Status: DC | PRN

## 2019-06-30 MED ORDER — PROPOFOL 10 MG/ML IV BOLUS
INTRAVENOUS | Status: DC | PRN
  Administered 2019-06-30 (×2): 50 mg via INTRAVENOUS

## 2019-06-30 MED ORDER — SODIUM CHLORIDE 0.9 % IV SOLN
INTRAVENOUS | Status: DC | PRN
  Administered 2019-06-30: 500 mL

## 2019-06-30 MED ORDER — FENTANYL CITRATE (PF) 250 MCG/5ML IJ SOLN
INTRAMUSCULAR | Status: AC
  Filled 2019-06-30: qty 5

## 2019-06-30 MED ORDER — SODIUM CHLORIDE 0.9 % IV SOLN: INTRAVENOUS | Status: DC

## 2019-06-30 MED ORDER — SODIUM CHLORIDE 0.9 % IV SOLN
INTRAVENOUS | Status: AC
  Filled 2019-06-30: qty 1.2

## 2019-06-30 MED ORDER — ASPIRIN EC 325 MG PO TBEC
DELAYED_RELEASE_TABLET | ORAL | Status: AC
  Filled 2019-06-30: qty 1

## 2019-06-30 MED ORDER — PROMETHAZINE HCL 25 MG/ML IJ SOLN: 6.2500 mg | INTRAMUSCULAR | Status: DC | PRN

## 2019-06-30 MED ORDER — CEFAZOLIN SODIUM-DEXTROSE 2-4 GM/100ML-% IV SOLN
2.0000 g | INTRAVENOUS | Status: AC
  Administered 2019-06-30: 2 g via INTRAVENOUS

## 2019-06-30 MED ORDER — MORPHINE SULFATE (PF) 2 MG/ML IV SOLN: 2.0000 mg | INTRAVENOUS | Status: DC | PRN

## 2019-06-30 MED ORDER — FENTANYL CITRATE (PF) 100 MCG/2ML IJ SOLN
INTRAMUSCULAR | Status: DC | PRN
  Administered 2019-06-30 (×2): 50 ug via INTRAVENOUS
  Administered 2019-06-30: 100 ug via INTRAVENOUS
  Administered 2019-06-30: 50 ug via INTRAVENOUS

## 2019-06-30 MED ORDER — ASPIRIN 325 MG PO TABS
325.0000 mg | ORAL_TABLET | Freq: Every day | ORAL | Status: DC
  Administered 2019-07-01 – 2019-07-02 (×2): 325 mg via ORAL
  Filled 2019-06-30 (×2): qty 1

## 2019-06-30 MED ORDER — MAGNESIUM SULFATE 2 GM/50ML IV SOLN: 2.0000 g | Freq: Every day | INTRAVENOUS | Status: DC | PRN

## 2019-06-30 MED ORDER — SENNOSIDES-DOCUSATE SODIUM 8.6-50 MG PO TABS: 1.0000 | ORAL_TABLET | Freq: Every evening | ORAL | Status: DC | PRN

## 2019-06-30 MED ORDER — CHLORHEXIDINE GLUCONATE CLOTH 2 % EX PADS: 6.0000 | MEDICATED_PAD | Freq: Once | CUTANEOUS | Status: DC

## 2019-06-30 MED ORDER — CLOPIDOGREL BISULFATE 75 MG PO TABS
ORAL_TABLET | ORAL | Status: AC
  Filled 2019-06-30: qty 1

## 2019-06-30 MED ORDER — METOPROLOL TARTRATE 5 MG/5ML IV SOLN: 2.0000 mg | INTRAVENOUS | Status: DC | PRN

## 2019-06-30 MED ORDER — LIDOCAINE 2% (20 MG/ML) 5 ML SYRINGE
INTRAMUSCULAR | Status: DC | PRN
  Administered 2019-06-30: 100 mg via INTRAVENOUS

## 2019-06-30 MED ORDER — MIDAZOLAM HCL 2 MG/2ML IJ SOLN
INTRAMUSCULAR | Status: AC
  Filled 2019-06-30: qty 2

## 2019-06-30 MED ORDER — DOCUSATE SODIUM 100 MG PO CAPS
100.0000 mg | ORAL_CAPSULE | Freq: Every day | ORAL | Status: DC
  Administered 2019-07-01 – 2019-07-02 (×2): 100 mg via ORAL
  Filled 2019-06-30 (×2): qty 1

## 2019-06-30 MED ORDER — PROPOFOL 10 MG/ML IV BOLUS
INTRAVENOUS | Status: AC
  Filled 2019-06-30: qty 20

## 2019-06-30 MED ORDER — ALUM & MAG HYDROXIDE-SIMETH 200-200-20 MG/5ML PO SUSP: 15.0000 mL | ORAL | Status: DC | PRN

## 2019-06-30 MED ORDER — SODIUM CHLORIDE 0.9 % IV SOLN: 500.0000 mL | Freq: Once | INTRAVENOUS | Status: DC | PRN

## 2019-06-30 MED ORDER — AMLODIPINE BESY-BENAZEPRIL HCL 5-10 MG PO CAPS: 1.0000 | ORAL_CAPSULE | Freq: Every day | ORAL | Status: DC

## 2019-06-30 MED ORDER — LABETALOL HCL 5 MG/ML IV SOLN: 10.0000 mg | INTRAVENOUS | Status: DC | PRN

## 2019-06-30 MED ORDER — PROTAMINE SULFATE 10 MG/ML IV SOLN
INTRAVENOUS | Status: DC | PRN
Start: 2019-06-30 — End: 2019-06-30
  Administered 2019-06-30: 10 mg via INTRAVENOUS
  Administered 2019-06-30 (×2): 20 mg via INTRAVENOUS

## 2019-06-30 MED ORDER — METFORMIN HCL 500 MG PO TABS
1000.0000 mg | ORAL_TABLET | Freq: Two times a day (BID) | ORAL | Status: DC
  Administered 2019-07-01 – 2019-07-02 (×3): 1000 mg via ORAL
  Filled 2019-06-30 (×3): qty 2

## 2019-06-30 MED ORDER — CHLORHEXIDINE GLUCONATE CLOTH 2 % EX PADS: 6.0000 | MEDICATED_PAD | Freq: Every day | CUTANEOUS | Status: DC

## 2019-06-30 MED ORDER — CEFAZOLIN SODIUM 1 G IJ SOLR
INTRAMUSCULAR | Status: AC
  Filled 2019-06-30: qty 20

## 2019-06-30 MED ORDER — GUAIFENESIN-DM 100-10 MG/5ML PO SYRP: 15.0000 mL | ORAL_SOLUTION | ORAL | Status: DC | PRN

## 2019-06-30 MED ORDER — HYDROMORPHONE HCL 1 MG/ML IJ SOLN
0.2500 mg | INTRAMUSCULAR | Status: DC | PRN
  Administered 2019-06-30 (×2): 0.5 mg via INTRAVENOUS

## 2019-06-30 MED ORDER — SUGAMMADEX SODIUM 200 MG/2ML IV SOLN
INTRAVENOUS | Status: DC | PRN
  Administered 2019-06-30: 200 mg via INTRAVENOUS

## 2019-06-30 MED ORDER — HYDROMORPHONE HCL 1 MG/ML IJ SOLN
INTRAMUSCULAR | Status: AC
  Filled 2019-06-30: qty 1

## 2019-06-30 MED ORDER — 0.9 % SODIUM CHLORIDE (POUR BTL) OPTIME
TOPICAL | Status: DC | PRN
  Administered 2019-06-30: 2000 mL

## 2019-06-30 MED ORDER — HEPARIN SODIUM (PORCINE) 1000 UNIT/ML IJ SOLN
INTRAMUSCULAR | Status: DC | PRN
  Administered 2019-06-30: 8000 [IU] via INTRAVENOUS

## 2019-06-30 MED ORDER — BENAZEPRIL HCL 5 MG PO TABS
10.0000 mg | ORAL_TABLET | Freq: Every day | ORAL | Status: DC
  Administered 2019-07-01 – 2019-07-02 (×2): 10 mg via ORAL
  Filled 2019-06-30 (×2): qty 2

## 2019-06-30 MED ORDER — PHENYLEPHRINE HCL-NACL 10-0.9 MG/250ML-% IV SOLN
INTRAVENOUS | Status: DC | PRN
  Administered 2019-06-30: 30 ug/min via INTRAVENOUS

## 2019-06-30 MED ORDER — AMLODIPINE BESYLATE 5 MG PO TABS
5.0000 mg | ORAL_TABLET | Freq: Every day | ORAL | Status: DC
  Administered 2019-07-01 – 2019-07-02 (×2): 5 mg via ORAL
  Filled 2019-06-30 (×2): qty 1

## 2019-06-30 MED ORDER — HEMOSTATIC AGENTS (NO CHARGE) OPTIME
TOPICAL | Status: DC | PRN
  Administered 2019-06-30: 1 via TOPICAL

## 2019-06-30 MED ORDER — DEXAMETHASONE SODIUM PHOSPHATE 10 MG/ML IJ SOLN
INTRAMUSCULAR | Status: DC | PRN
Start: 2019-06-30 — End: 2019-06-30
  Administered 2019-06-30: 4.9913 mg via INTRAVENOUS

## 2019-06-30 MED ORDER — LACTATED RINGERS IV SOLN: INTRAVENOUS | Status: DC | PRN

## 2019-06-30 MED ORDER — CLOPIDOGREL BISULFATE 75 MG PO TABS
75.0000 mg | ORAL_TABLET | Freq: Every day | ORAL | Status: DC
  Administered 2019-07-01 – 2019-07-02 (×2): 75 mg via ORAL
  Filled 2019-06-30 (×2): qty 1

## 2019-06-30 MED ORDER — OXYCODONE HCL 5 MG PO TABS
5.0000 mg | ORAL_TABLET | ORAL | Status: DC | PRN
  Administered 2019-06-30 – 2019-07-02 (×2): 5 mg via ORAL
  Administered 2019-07-02: 10 mg via ORAL
  Filled 2019-06-30: qty 2
  Filled 2019-06-30 (×2): qty 1

## 2019-06-30 MED ORDER — ONDANSETRON HCL 4 MG/2ML IJ SOLN
INTRAMUSCULAR | Status: DC | PRN
  Administered 2019-06-30: 4 mg via INTRAVENOUS

## 2019-06-30 MED ORDER — POLYSACCHARIDE IRON COMPLEX 150 MG PO CAPS
150.0000 mg | ORAL_CAPSULE | Freq: Every day | ORAL | Status: DC
  Administered 2019-07-01 – 2019-07-02 (×2): 150 mg via ORAL
  Filled 2019-06-30 (×2): qty 1

## 2019-06-30 MED ORDER — CEFAZOLIN SODIUM-DEXTROSE 2-4 GM/100ML-% IV SOLN
2.0000 g | Freq: Three times a day (TID) | INTRAVENOUS | Status: AC
  Administered 2019-06-30 (×2): 2 g via INTRAVENOUS
  Filled 2019-06-30: qty 100

## 2019-06-30 MED ORDER — NICOTINE 14 MG/24HR TD PT24
14.0000 mg | MEDICATED_PATCH | Freq: Every day | TRANSDERMAL | Status: DC
  Filled 2019-06-30 (×2): qty 1

## 2019-06-30 MED ORDER — ROSUVASTATIN CALCIUM 5 MG PO TABS
10.0000 mg | ORAL_TABLET | Freq: Every day | ORAL | Status: DC
  Administered 2019-06-30 – 2019-07-01 (×2): 10 mg via ORAL
  Filled 2019-06-30 (×2): qty 2

## 2019-06-30 SURGICAL SUPPLY — 70 items
ADH SKN CLS APL DERMABOND .7 (GAUZE/BANDAGES/DRESSINGS) ×4
BANDAGE ESMARK 6X9 LF (GAUZE/BANDAGES/DRESSINGS) IMPLANT
BLADE AVERAGE 25MMX9MM (BLADE)
BLADE AVERAGE 25X9 (BLADE) IMPLANT
BLADE SAW SGTL 81X20 HD (BLADE) IMPLANT
BNDG CMPR 9X6 STRL LF SNTH (GAUZE/BANDAGES/DRESSINGS)
BNDG ELASTIC 4X5.8 VLCR STR LF (GAUZE/BANDAGES/DRESSINGS) ×1 IMPLANT
BNDG ESMARK 6X9 LF (GAUZE/BANDAGES/DRESSINGS)
BNDG GAUZE ELAST 4 BULKY (GAUZE/BANDAGES/DRESSINGS) ×1 IMPLANT
CANISTER SUCT 3000ML PPV (MISCELLANEOUS) ×4 IMPLANT
CANNULA VESSEL 3MM 2 BLNT TIP (CANNULA) ×3 IMPLANT
CLIP VESOCCLUDE MED 24/CT (CLIP) ×4 IMPLANT
CLIP VESOCCLUDE SM WIDE 24/CT (CLIP) ×7 IMPLANT
COVER PROBE W GEL 5X96 (DRAPES) ×3 IMPLANT
COVER SURGICAL LIGHT HANDLE (MISCELLANEOUS) ×7 IMPLANT
COVER WAND RF STERILE (DRAPES) ×1 IMPLANT
CUFF TOURN SGL QUICK 24 (TOURNIQUET CUFF)
CUFF TOURN SGL QUICK 34 (TOURNIQUET CUFF)
CUFF TOURN SGL QUICK 42 (TOURNIQUET CUFF) IMPLANT
CUFF TRNQT CYL 24X4X16.5-23 (TOURNIQUET CUFF) IMPLANT
CUFF TRNQT CYL 34X4.125X (TOURNIQUET CUFF) IMPLANT
DERMABOND ADVANCED (GAUZE/BANDAGES/DRESSINGS) ×4
DERMABOND ADVANCED .7 DNX12 (GAUZE/BANDAGES/DRESSINGS) ×3 IMPLANT
DRAIN CHANNEL 15F RND FF W/TCR (WOUND CARE) IMPLANT
DRAPE C-ARM 42X72 X-RAY (DRAPES) IMPLANT
DRAPE EXTREMITY T 121X128X90 (DISPOSABLE) ×4 IMPLANT
DRAPE HALF SHEET 40X57 (DRAPES) ×4 IMPLANT
ELECT REM PT RETURN 9FT ADLT (ELECTROSURGICAL) ×4
ELECTRODE REM PT RTRN 9FT ADLT (ELECTROSURGICAL) ×2 IMPLANT
EVACUATOR SILICONE 100CC (DRAIN) IMPLANT
GAUZE SPONGE 4X4 12PLY STRL (GAUZE/BANDAGES/DRESSINGS) ×4 IMPLANT
GLOVE BIO SURGEON STRL SZ 6.5 (GLOVE) ×6 IMPLANT
GLOVE BIO SURGEON STRL SZ7.5 (GLOVE) ×4 IMPLANT
GLOVE BIO SURGEONS STRL SZ 6.5 (GLOVE) ×3
GLOVE BIOGEL PI IND STRL 6.5 (GLOVE) ×3 IMPLANT
GLOVE BIOGEL PI IND STRL 7.0 (GLOVE) ×1 IMPLANT
GLOVE BIOGEL PI INDICATOR 6.5 (GLOVE) ×6
GLOVE BIOGEL PI INDICATOR 7.0 (GLOVE) ×2
GOWN STRL REUS W/ TWL LRG LVL3 (GOWN DISPOSABLE) ×4 IMPLANT
GOWN STRL REUS W/ TWL XL LVL3 (GOWN DISPOSABLE) ×2 IMPLANT
GOWN STRL REUS W/TWL LRG LVL3 (GOWN DISPOSABLE) ×8
GOWN STRL REUS W/TWL XL LVL3 (GOWN DISPOSABLE) ×4
HEMOSTAT SNOW SURGICEL 2X4 (HEMOSTASIS) ×3 IMPLANT
INSERT FOGARTY SM (MISCELLANEOUS) IMPLANT
KIT BASIN OR (CUSTOM PROCEDURE TRAY) ×4 IMPLANT
KIT TURNOVER KIT B (KITS) ×4 IMPLANT
MARKER GRAFT CORONARY BYPASS (MISCELLANEOUS) IMPLANT
NDL HYPO 25GX1X1/2 BEV (NEEDLE) IMPLANT
NEEDLE HYPO 25GX1X1/2 BEV (NEEDLE) IMPLANT
NS IRRIG 1000ML POUR BTL (IV SOLUTION) ×8 IMPLANT
PACK GENERAL/GYN (CUSTOM PROCEDURE TRAY) ×1 IMPLANT
PACK PERIPHERAL VASCULAR (CUSTOM PROCEDURE TRAY) ×4 IMPLANT
PAD ARMBOARD 7.5X6 YLW CONV (MISCELLANEOUS) ×8 IMPLANT
SET COLLECT BLD 21X3/4 12 (NEEDLE) IMPLANT
SUT ETHILON 3 0 PS 1 (SUTURE) ×4 IMPLANT
SUT MNCRL AB 4-0 PS2 18 (SUTURE) ×8 IMPLANT
SUT PROLENE 5 0 C 1 24 (SUTURE) ×7 IMPLANT
SUT PROLENE 6 0 BV (SUTURE) ×22 IMPLANT
SUT SILK 2 0 SH (SUTURE) ×4 IMPLANT
SUT SILK 3 0 (SUTURE) ×8
SUT SILK 3-0 18XBRD TIE 12 (SUTURE) ×2 IMPLANT
SUT VIC AB 2-0 CT1 27 (SUTURE) ×4
SUT VIC AB 2-0 CT1 TAPERPNT 27 (SUTURE) ×3 IMPLANT
SUT VIC AB 3-0 SH 27 (SUTURE) ×12
SUT VIC AB 3-0 SH 27X BRD (SUTURE) ×5 IMPLANT
SYR CONTROL 10ML LL (SYRINGE) IMPLANT
TOWEL GREEN STERILE (TOWEL DISPOSABLE) ×8 IMPLANT
TRAY FOLEY MTR SLVR 16FR STAT (SET/KITS/TRAYS/PACK) ×4 IMPLANT
UNDERPAD 30X36 HEAVY ABSORB (UNDERPADS AND DIAPERS) ×4 IMPLANT
WATER STERILE IRR 1000ML POUR (IV SOLUTION) ×4 IMPLANT

## 2019-06-30 NOTE — Progress Notes (Signed)
  Progress Note    06/30/2019 2:54 PM Day of Surgery  Subjective:  Left leg surgical site soreness and burning otherwise states that he is feeling good   Vitals:   06/30/19 1230 06/30/19 1316  BP: (!) 152/77 (!) 157/90  Pulse: 66 62  Resp: 20 (!) 21  Temp: 97.6 F (36.4 C) (!) 97.5 F (36.4 C)  SpO2: 100% 100%   Physical Exam: Cardiac:  regular Lungs: non labored Incisions:  Left lower extremity incisions c/d/i. No hematoma Extremities: bilateral lower extremities well perfused and warm. Left foot with Dp/PT doppler signals. PT brisk compared to DP. Left 2nd toe with dry gangrene Abdomen: soft, non tender Neurologic: alert and oriented  CBC    Component Value Date/Time   WBC 5.8 06/30/2019 0623   RBC 4.84 06/30/2019 0623   HGB 12.1 (L) 06/30/2019 0623   HCT 40.1 06/30/2019 0623   HCT 44 (A) 11/14/2015 0758   PLT 229 06/30/2019 0623   MCV 82.9 06/30/2019 0623   MCV 87.8 11/14/2015 0758   MCH 25.0 (L) 06/30/2019 0623   MCHC 30.2 06/30/2019 0623   RDW 14.9 06/30/2019 0623   RDW 13.7 11/14/2015 0758   LYMPHSABS 1.0 04/09/2019 0820   MONOABS 0.7 04/09/2019 0820   EOSABS 0.1 04/09/2019 0820   EOSABS 180 11/14/2015 0758   BASOSABS 0.0 04/09/2019 0820    BMET    Component Value Date/Time   NA 139 06/30/2019 0623   NA 137 11/14/2015 0758   K 4.2 06/30/2019 0623   K 4.1 11/14/2015 0758   CL 103 06/30/2019 0623   CL 102 11/14/2015 0758   CO2 27 06/30/2019 0623   CO2 26 11/14/2015 0758   GLUCOSE 160 (H) 06/30/2019 0623   BUN 10 06/30/2019 0623   CREATININE 0.80 06/30/2019 0623   CREATININE 1.03 11/14/2015 0758   CALCIUM 9.3 06/30/2019 0623   CALCIUM 9.1 11/14/2015 0758   GFRNONAA >60 06/30/2019 0623   GFRAA >60 06/30/2019 0623    INR    Component Value Date/Time   INR 0.9 06/30/2019 0623     Intake/Output Summary (Last 24 hours) at 06/30/2019 1454 Last data filed at 06/30/2019 1441 Gross per 24 hour  Intake 1882 ml  Output 1250 ml  Net 632 ml      Assessment/Plan:  64 y.o. male is s/p left SFA to posterior tibial artery bypass with ipsilateral translocated non reversed greater saphenous vein Day of Surgery. Doing well post op. Pain is well managed. Tolerating diet. Afebrile. Left lower extremity incisions clean, dry and intact. Doppler DP/PT signals and warm left foot. Will continue to allow left 2nd toe to demarcate. PT/OT to evaluate this afternoon/ in the morning   Graceann Congress, PA-C [facility] [phone] 06/30/2019 2:54 PM

## 2019-06-30 NOTE — Transfer of Care (Signed)
Immediate Anesthesia Transfer of Care Note  Patient: Juan Turner  Procedure(s) Performed: LEFT BYPASS GRAFT SUPERFICIAL FEMORAL-Posterior TIBIAL ARTERY using nonreversed saphenous vein (Left Leg Lower)  Patient Location: PACU  Anesthesia Type:General  Level of Consciousness: sedated  Airway & Oxygen Therapy: Patient Spontanous Breathing and Patient connected to nasal cannula oxygen  Post-op Assessment: Report given to RN, Post -op Vital signs reviewed and stable and Patient moving all extremities X 4  Post vital signs: Reviewed and stable  Last Vitals:  Vitals Value Taken Time  BP 148/70 06/30/19 1014  Temp    Pulse 75 06/30/19 1015  Resp 18 06/30/19 1015  SpO2 100 % 06/30/19 1015  Vitals shown include unvalidated device data.  Last Pain:  Vitals:   06/30/19 0637  TempSrc:   PainSc: 0-No pain      Patients Stated Pain Goal: 3 (06/30/19 9191)  Complications: No apparent anesthesia complications

## 2019-06-30 NOTE — H&P (Signed)
H+P    History of Present Illness: This is a 64 y.o. male 63 year old male initially underwent right SFA to posterior tibial artery bypass with vein.  He also had the first 2 toes amputated now has undergone transmetatarsal amputation.  This is healing well.  He has persistent ulceration of the left second toe.  Past Medical History:  Diagnosis Date  . Anemia   . Anxiety   . Aortic stenosis   . Diabetes mellitus   . GERD (gastroesophageal reflux disease)   . HLD (hyperlipidemia)   . Hypertension   . Smoker     Past Surgical History:  Procedure Laterality Date  . ABDOMINAL AORTOGRAM W/LOWER EXTREMITY N/A 04/05/2019   Procedure: ABDOMINAL AORTOGRAM W/LOWER EXTREMITY;  Surgeon: Elam Dutch, MD;  Location: [facility];  Service: Cardiovascular;  Laterality: N/A;  . ABDOMINAL AORTOGRAM W/LOWER EXTREMITY Bilateral 06/06/2019   Procedure: ABDOMINAL AORTOGRAM W/LOWER EXTREMITY;  Surgeon: Waynetta Sandy, MD;  Location: [facility];  Service: Cardiovascular;  Laterality: Bilateral;  . AMPUTATION Right 04/07/2019   Procedure: AMPUTATION DIGIT RIGHT FIRST AND SECOND TOES;  Surgeon: Waynetta Sandy, MD;  Location: [facility];  Service: Vascular;  Laterality: Right;  . FEMORAL-TIBIAL BYPASS GRAFT Right 04/07/2019   Procedure: LEFT FEMORAL POSTERIOR TIBIAL BYPASS;  Surgeon: Waynetta Sandy, MD;  Location: [facility];  Service: Vascular;  Laterality: Right;  . HERNIA REPAIR    . TRANSMETATARSAL AMPUTATION Right 04/28/2019   Right transmetatarsal amputation  . TRANSMETATARSAL AMPUTATION Right 04/28/2019   Procedure: TRANSMETATARSAL AMPUTATION;  Surgeon: Waynetta Sandy, MD;  Location: [facility];  Service: Vascular;  Laterality: Right;    No Known Allergies  Prior to Admission medications   Medication Sig Start Date End Date Taking? Authorizing Provider  amLODipine-benazepril (LOTREL) 5-10 MG per capsule Take 1 capsule by mouth daily.   Yes [provider]  aspirin 325 MG tablet Take 325 mg by mouth daily.   Yes [provider]  docusate sodium (COLACE) 100 MG capsule Take 1 capsule (100 mg total) by mouth daily. 04/10/19  Yes Sheikh, Omair Latif, DO  glipiZIDE (GLUCOTROL) 5 MG tablet Take 1 tablet (5 mg total) by mouth daily before breakfast. 04/09/19  Yes Sheikh, Omair Latif, DO  iron polysaccharides (NIFEREX) 150 MG capsule Take 1 capsule (150 mg total) by mouth daily. 04/22/19  Yes Waynetta Sandy, MD  nicotine (NICODERM CQ - DOSED IN MG/24 HOURS) 14 mg/24hr patch Place 14 mg onto the skin daily.   Yes [provider]  oxyCODONE (OXY IR/ROXICODONE) 5 MG immediate release tablet Take 1 tablet (5 mg total) by mouth every 4 (four) hours as needed for moderate pain. 04/30/19  Yes Rhyne, Samantha J, PA-C  pantoprazole (PROTONIX) 40 MG tablet Take 1 tablet (40 mg total) by mouth daily. 04/10/19  Yes Sheikh, Omair Latif, DO  rosuvastatin (CRESTOR) 10 MG tablet Take 1 tablet (10 mg total) by mouth daily at 6 PM. 04/09/19  Yes Sheikh, Omair Latif, DO  ammonium lactate (AMLACTIN) 12 % cream Apply topically as needed for dry skin. Apply only to the heel, not to an open wound 05/03/19   Trula Slade, DPM  aspirin EC 81 MG EC tablet Take 1 tablet (81 mg total) by mouth daily. Patient not taking: Reported on 05/27/2019 04/10/19   Raiford Noble Latif, DO  blood glucose meter kit and supplies KIT Dispense based on patient and insurance preference. Use up to four times daily as directed. (  FOR ICD-9 250.00, 250.01). 04/09/19   Raiford Noble Latif, DO  metFORMIN (GLUCOPHAGE) 1000 MG tablet Take 1 tablet (1,000 mg total) by mouth 2 (two) times daily with a meal. 04/09/19 05/27/19  Sheikh, Omair Latif, DO  nicotine (NICODERM CQ - DOSED IN MG/24 HOURS) 21 mg/24hr patch Place 1 patch (21 mg total) onto the skin daily. Patient not taking: Reported on 05/27/2019 04/10/19   Kerney Elbe, DO    Social History   Socioeconomic  History  . Marital status: Married    Spouse name: Not on file  . Number of children: Not on file  . Years of education: Not on file  . Highest education level: Not on file  Occupational History  . Not on file  Tobacco Use  . Smoking status: Former Smoker    Packs/day: 1.00    Types: Cigarettes  . Smokeless tobacco: Never Used  . Tobacco comment: Hasn't smoked since his surgery 03/2019  Substance and Sexual Activity  . Alcohol use: Not Currently    Alcohol/week: 1.0 - 2.0 standard drinks    Types: 1 - 2 Cans of beer per week  . Drug use: Never  . Sexual activity: Not on file  Other Topics Concern  . Not on file  Social History Narrative  . Not on file   Social Determinants of Health   Financial Resource Strain:   . Difficulty of Paying Living Expenses:   Food Insecurity:   . Worried About Charity fundraiser in the Last Year:   . Arboriculturist in the Last Year:   Transportation Needs:   . Film/video editor (Medical):   Marland Kitchen Lack of Transportation (Non-Medical):   Physical Activity:   . Days of Exercise per Week:   . Minutes of Exercise per Session:   Stress:   . Feeling of Stress :   Social Connections:   . Frequency of Communication with Friends and Family:   . Frequency of Social Gatherings with Friends and Family:   . Attends Religious Services:   . Active Member of Clubs or Organizations:   . Attends Archivist Meetings:   Marland Kitchen Marital Status:   Intimate Partner Violence:   . Fear of Current or Ex-Partner:   . Emotionally Abused:   Marland Kitchen Physically Abused:   . Sexually Abused:     History reviewed. No pertinent family history.  ROS: Healing right transmetatarsal amputation Left second toe ulceration  Physical Examination  Vitals:   06/30/19 0620  BP: (!) 182/84  Pulse: 68  Resp: 18  Temp: (!) 97.1 F (36.2 C)  SpO2: 100%   Body mass index is 21.02 kg/m.  Awake alert oriented Nonlabored respirations Palpable right posterior tibial  artery pulse Monophasic left posterior tibial artery signal Healing right transmetatarsal amputation Left second toe with dark discoloration and gangrenous changes without evidence of infection  CBC    Component Value Date/Time   WBC 5.8 06/30/2019 0623   RBC 4.84 06/30/2019 0623   HGB 12.1 (L) 06/30/2019 0623   HCT 40.1 06/30/2019 0623   HCT 44 (A) 11/14/2015 0758   PLT 229 06/30/2019 0623   MCV 82.9 06/30/2019 0623   MCV 87.8 11/14/2015 0758   MCH 25.0 (L) 06/30/2019 0623   MCHC 30.2 06/30/2019 0623   RDW 14.9 06/30/2019 0623   RDW 13.7 11/14/2015 0758   LYMPHSABS 1.0 04/09/2019 0820   MONOABS 0.7 04/09/2019 0820   EOSABS 0.1 04/09/2019 0820   EOSABS 180  11/14/2015 0758   BASOSABS 0.0 04/09/2019 0820    BMET    Component Value Date/Time   NA 139 06/30/2019 0623   NA 137 11/14/2015 0758   K 4.2 06/30/2019 0623   K 4.1 11/14/2015 0758   CL 103 06/30/2019 0623   CL 102 11/14/2015 0758   CO2 27 06/30/2019 0623   CO2 26 11/14/2015 0758   GLUCOSE 160 (H) 06/30/2019 0623   BUN 10 06/30/2019 0623   CREATININE 0.80 06/30/2019 0623   CREATININE 1.03 11/14/2015 0758   CALCIUM 9.3 06/30/2019 0623   CALCIUM 9.1 11/14/2015 0758   GFRNONAA >60 06/30/2019 0623   GFRAA >60 06/30/2019 0623    COAGS: Lab Results  Component Value Date   INR 0.9 06/30/2019   INR 1.0 04/28/2019      ASSESSMENT/PLAN: This is a 64 y.o. male status post right lower extremity revascularization now transmetatarsal amputation which is healing well.  Plan now for left sfa to PT bypass for critical limb ischemia with left 2nd toe tip ulceration.   Najwa Spillane C. Donzetta Matters, MD [facility] Office: [phone] Pager: [phone]

## 2019-06-30 NOTE — Anesthesia Postprocedure Evaluation (Signed)
Anesthesia Post Note  Patient: [name]  Procedure(s) Performed: LEFT BYPASS GRAFT SUPERFICIAL FEMORAL-Posterior TIBIAL ARTERY using nonreversed saphenous vein (Left Leg Lower)     Patient location during evaluation: PACU Anesthesia Type: General Level of consciousness: sedated and patient cooperative Pain management: pain level controlled Vital Signs Assessment: post-procedure vital signs reviewed and stable Respiratory status: spontaneous breathing Cardiovascular status: stable Anesthetic complications: no    Last Vitals:  Vitals:   06/30/19 1316 06/30/19 1638  BP: (!) 157/90 133/77  Pulse: 62 75  Resp: (!) 21 16  Temp: (!) 36.4 C 36.6 C  SpO2: 100% 100%    Last Pain:  Vitals:   06/30/19 1638  TempSrc: Oral  PainSc:                  Lewie Loron

## 2019-06-30 NOTE — Progress Notes (Signed)
Mobility Specialist: Progress Note    06/30/19 1604  Mobility  Activity Dangled on edge of bed  Level of Assistance Contact guard assist, steadying assist  Assistive Device None  Mobility Response Tolerated well  Mobility performed by Mobility specialist  $Mobility charge 1 Mobility   Pre-Mobility: 90 HR, 98% SpO2 During Mobility: 101 HR, 100% SpO2 Post-Mobility: 79 HR, 100% SpO2  Pt tolerated dangling on the edge of the bed well. Pt is eager to begin walking. Pt was left in bed with phone and call bell within reach. Family member was present in the room.   [facility] Takyra Cantrall Mobility Specialist

## 2019-06-30 NOTE — Anesthesia Procedure Notes (Signed)
Arterial Line Insertion Start/End5/20/2021 7:12 AM, 06/30/2019 7:20 AM Performed by: Carmela Rima, CRNA, CRNA  Patient location: Pre-op. Preanesthetic checklist: patient identified, IV checked, site marked, risks and benefits discussed, surgical consent, monitors and equipment checked and pre-op evaluation Lidocaine 1% used for infiltration Right, radial was placed Catheter size: 20 G Hand hygiene performed  and maximum sterile barriers used   Attempts: 2 Procedure performed without using ultrasound guided technique. Following insertion, dressing applied and Biopatch. Post procedure assessment: normal and unchanged  Patient tolerated the procedure well with no immediate complications.

## 2019-06-30 NOTE — Anesthesia Procedure Notes (Signed)
Procedure Name: Intubation Date/Time: 06/30/2019 7:33 AM Performed by: Neldon Newport, CRNA Pre-anesthesia Checklist: Timeout performed, Patient being monitored, Suction available, Emergency Drugs available and Patient identified Patient Re-evaluated:Patient Re-evaluated prior to induction Oxygen Delivery Method: Circle system utilized Preoxygenation: Pre-oxygenation with 100% oxygen Induction Type: IV induction Ventilation: Mask ventilation without difficulty Laryngoscope Size: Mac and 4 Grade View: Grade I Tube type: Oral Tube size: 7.5 mm Number of attempts: 1 Placement Confirmation: ETT inserted through vocal cords under direct vision,  positive ETCO2 and breath sounds checked- equal and bilateral Secured at: 23 cm Tube secured with: Tape Dental Injury: Teeth and Oropharynx as per pre-operative assessment

## 2019-06-30 NOTE — Op Note (Signed)
Patient name: JAMIN PANTHER MRN: [id number] DOB: 1956-01-23 Sex: male  06/30/2019 Pre-operative Diagnosis: Critical left lower extremity ischemia with second toe ulceration Post-operative diagnosis:  Same Surgeon:  Luanna Salk. Randie Heinz, MD Assistant: Graceann Congress, PA Procedure Performed: 1.  Harvest of left greater saphenous vein 2.  Left SFA to posterior tibial artery bypass with ipsilateral translocated nonreversed greater saphenous vein  Indications: 64 year old male has a history of right SFA to posterior tibial artery bypass and transmetatarsal amputation which is now well-healed.  He has left second toe ulceration.  He has similar disease process on the left and is indicated for a similar bypass.  Findings: Greater saphenous vein on this size was much larger easily dilated to 4 mm throughout the course.  We harvested through 2 incisions the above-knee popliteal/sfa exposure as well as the incision for the below-knee posterior tibial artery exposure.  Above the knee the SFA was soft was pulsatile.  The posterior tibial artery was calcified although did have an adequate lumen and good backbleeding.  At completion there was a strong posterior tibial artery signal that was graft dependent.   Procedure:  The patient was identified in the holding area and taken to the operating was placed supine operative when general anesthesia was induced.  He was sterilely prepped and draped in the left lower extremity in the usual fashion antibiotics were minister timeout was called.  We began using ultrasound to identify the saphenous vein.  Below the knee we made a longitudinal incision overlying the saphenous vein.  We dissected out this for several centimeters extending our incision cephalad and caudally.  We then traced it up to the above knee and made an incision between the identifiable saphenous vein and typical exposure for above-knee popliteal artery.  We made an incision there dissected out our vein.   We then dissected down to the deep fascia identified our popliteal and SFA above the knee.  This was quite soft a vessel loop was placed around it.  We then transected the vein distally and proximally and placed double clips on both ends.  The vein was passed to the back table where it was prepared by our PA.  I then began exposing the below-knee popliteal artery extended down to the posterior tibial artery.  The peroneal vein was divided between ties.  When I had an adequate exposure of posterior tibial artery then tunneled between the 2 incisions and the patient was fully heparinized.  The vein was brought to the table we completed preparation of the vein.  We clamped the SFA above the knee proximally distally opened longitudinally.  We had very strong inflow.  The vein was spatulated in a nonreversed fashion and sewn end-to-side with 6-0 Prolene suture.  Upon completion we then flushed through the graft.  Valvulotome was used to lyse the valves.  We had very strong inflow.  This was doubly clipped distally and marked for orientation and tunneled through our previously placed tunneler.  We then clamped the posterior tibial artery proximally distally opened longitudinally.  We did have very good backbleeding and it appeared to be healthy for bypass there.  The leg was straightened the vein was trimmed to size.  This was spatulated and sewn end-to-side with 6-0 Prolene suture.  This time prior to completion we let flushing all directions.  Upon completion we had a very good signal distally in the posterior tibial artery both in the wound and at the ankle.  This was graft dependent.  Satisfied we administered 50 mg of protamine.  We obtain hemostasis in both of her wounds.  We closed in layers of Vicryl Monocryl.  He was awake from anesthesia having tolerated procedure without immediate complication.  All counts were correct at completion.  EBL: 200 cc    Nikeia Henkes C. Donzetta Matters, MD [facility] Office: [phone] Pager: [phone]

## 2019-07-01 ENCOUNTER — Encounter (HOSPITAL_COMMUNITY): Payer: Medicaid Other

## 2019-07-01 DIAGNOSIS — E1151 Type 2 diabetes mellitus with diabetic peripheral angiopathy without gangrene: Secondary | ICD-10-CM | POA: Diagnosis not present

## 2019-07-01 DIAGNOSIS — E1152 Type 2 diabetes mellitus with diabetic peripheral angiopathy with gangrene: Secondary | ICD-10-CM | POA: Diagnosis not present

## 2019-07-01 LAB — BASIC METABOLIC PANEL
Anion gap: 9 (ref 5–15)
BUN: 10 mg/dL (ref 8–23)
CO2: 23 mmol/L (ref 22–32)
Calcium: 8.3 mg/dL — ABNORMAL LOW (ref 8.9–10.3)
Chloride: 103 mmol/L (ref 98–111)
Creatinine, Ser: 0.88 mg/dL (ref 0.61–1.24)
GFR calc Af Amer: >60 mL/min (ref >60)
GFR calc non Af Amer: >60 mL/min (ref >60)
Glucose, Bld: 293 mg/dL — ABNORMAL HIGH (ref 70–99)
Potassium: 4.1 mmol/L (ref 3.5–5.1)
Sodium: 135 mmol/L (ref 135–145)

## 2019-07-01 LAB — CBC
HCT: 30 % — ABNORMAL LOW (ref 39.0–52.0)
Hemoglobin: 9.6 g/dL — ABNORMAL LOW (ref 13.0–17.0)
MCH: 25.5 pg — ABNORMAL LOW (ref 26.0–34.0)
MCHC: 32 g/dL (ref 30.0–36.0)
MCV: 79.8 fL — ABNORMAL LOW (ref 80.0–100.0)
Platelets: 206 10*3/uL (ref 150–400)
RBC: 3.76 MIL/uL — ABNORMAL LOW (ref 4.22–5.81)
RDW: 14.9 % (ref 11.5–15.5)
WBC: 7.7 10*3/uL (ref 4.0–10.5)
nRBC: 0 % (ref 0.0–0.2)

## 2019-07-01 LAB — GLUCOSE, CAPILLARY
Glucose-Capillary: 213 mg/dL — ABNORMAL HIGH (ref 70–99)
Glucose-Capillary: 154 mg/dL — ABNORMAL HIGH (ref 70–99)
Glucose-Capillary: 148 mg/dL — ABNORMAL HIGH (ref 70–99)
Glucose-Capillary: 216 mg/dL — ABNORMAL HIGH (ref 70–99)

## 2019-07-01 NOTE — Discharge Summary (Signed)
Discharge Summary     Juan Turner [birth year] 64 y.o. male  [id number]  Admission Date: 06/30/2019  Discharge Date: 07/02/19  Physician: No att. providers found  Admission Diagnosis: PAD (peripheral artery disease) ([place]) [I73.9]  HPI:   This is a 64 y.o. male who initially underwent a right SFA to posterior tibial artery bypass with vein. He also had the first 2 toes amputated but subsequently underwent a transmetatarsal amputation. This is healing well. He has a persistent ulceration of the left second toe. He now requires a left SFA to posterior tibial bypass for critical limb ischemia and likely second toe ampuation  Hospital Course:  The patient was admitted to the hospital and taken to the operating room on 06/30/2019 and underwent a left SFA to posterior tibial artery bypass with ipsilateral translocated non reversed greater saphenous vein. Harvest of left greater saphenous vein.   Findings:Greater saphenous vein on this size was much larger easily dilated to 4 mm throughout the course.  We harvested through 2 incisions the above-knee popliteal/sfa exposure as well as the incision for the below-knee posterior tibial artery exposure.  Above the knee the SFA was soft was pulsatile.  The posterior tibial artery was calcified although did have an adequate lumen and good backbleeding.  At completion there was a strong posterior tibial artery signal that was graft dependent  The pt tolerated the procedure well and was transported to the PACU in excellent condition. Left foot well perfused with doppler DP/PT signals post bypass. He was transferred to 4E in good condition. Left leg with some surgical site pain but otherwise well controlled  By POD 1, patency of left lower extremity bypass. some left leg surgical site pain. Incisions of left leg clean dry and intact. No hematoma. DP/PT doppler signals. Ambulation as tolerated. Tolerating diet.  POD#2 continued patency of bypass with  doppler DP/ PT signals. The remainder of the hospital course consisted of increasing mobilization and increasing intake of solids without difficulty. Patient doing well overall and eager to go home. He is stable for discharge. He will continue his aspirin and statin. Prescriptions for Norco and Plavix sent to patients pharmacy. He will have follow up with Dr. Trula Slade in 2-3 with ABI's and left lower extremity arterial duplex  CBC    Component Value Date/Time   WBC 7.7 07/01/2019 0256   RBC 3.76 (L) 07/01/2019 0256   HGB 9.6 (L) 07/01/2019 0256   HCT 30.0 (L) 07/01/2019 0256   HCT 44 (A) 11/14/2015 0758   PLT 206 07/01/2019 0256   MCV 79.8 (L) 07/01/2019 0256   MCV 87.8 11/14/2015 0758   MCH 25.5 (L) 07/01/2019 0256   MCHC 32.0 07/01/2019 0256   RDW 14.9 07/01/2019 0256   RDW 13.7 11/14/2015 0758   LYMPHSABS 1.0 04/09/2019 0820   MONOABS 0.7 04/09/2019 0820   EOSABS 0.1 04/09/2019 0820   EOSABS 180 11/14/2015 0758   BASOSABS 0.0 04/09/2019 0820    BMET    Component Value Date/Time   NA 135 07/01/2019 0256   NA 137 11/14/2015 0758   K 4.1 07/01/2019 0256   K 4.1 11/14/2015 0758   CL 103 07/01/2019 0256   CL 102 11/14/2015 0758   CO2 23 07/01/2019 0256   CO2 26 11/14/2015 0758   GLUCOSE 293 (H) 07/01/2019 0256   BUN 10 07/01/2019 0256   CREATININE 0.88 07/01/2019 0256   CREATININE 1.03 11/14/2015 0758   CALCIUM 8.3 (L) 07/01/2019 0256   CALCIUM 9.1 11/14/2015  0347   GFRNONAA >60 07/01/2019 0256   GFRAA >60 07/01/2019 0256     Discharge Instructions    Discharge patient   Complete by: As directed    Discharge disposition: 01-Home or Self Care   Discharge patient date: 07/02/2019      Discharge Diagnosis:  PAD (peripheral artery disease) ([place]) [I73.9]  Secondary Diagnosis: Patient Active Problem List   Diagnosis Date Noted  . PAD (peripheral artery disease) ([place]) 06/30/2019  . Peripheral vascular disease ([place]) 04/28/2019  . Dry gangrene ([place]) 04/04/2019  .  Type 2 diabetes mellitus with foot ulcer ([place]) 04/04/2019  . Essential hypertension 04/04/2019  . Gangrene ([place]) 04/04/2019   Past Medical History:  Diagnosis Date  . Anemia   . Anxiety   . Aortic stenosis   . Diabetes mellitus   . GERD (gastroesophageal reflux disease)   . HLD (hyperlipidemia)   . Hypertension   . Smoker      Allergies as of 07/02/2019   No Known Allergies     Medication List    STOP taking these medications   oxyCODONE 5 MG immediate release tablet Commonly known as: Oxy IR/ROXICODONE     TAKE these medications   amLODipine-benazepril 5-10 MG capsule Commonly known as: LOTREL Take 1 capsule by mouth daily.   ammonium lactate 12 % cream Commonly known as: AMLACTIN Apply topically as needed for dry skin. Apply only to the heel, not to an open wound   aspirin 81 MG EC tablet Take 1 tablet (81 mg total) by mouth daily. What changed: Another medication with the same name was removed. Continue taking this medication, and follow the directions you see here.   blood glucose meter kit and supplies Kit Dispense based on patient and insurance preference. Use up to four times daily as directed. (FOR ICD-9 250.00, 250.01).   clopidogrel 75 MG tablet Commonly known as: PLAVIX Take 1 tablet (75 mg total) by mouth daily at 6 (six) AM.   docusate sodium 100 MG capsule Commonly known as: COLACE Take 1 capsule (100 mg total) by mouth daily.   glipiZIDE 5 MG tablet Commonly known as: GLUCOTROL Take 1 tablet (5 mg total) by mouth daily before breakfast.   HYDROcodone-acetaminophen 5-325 MG tablet Commonly known as: NORCO/VICODIN Take 1 tablet by mouth every 6 (six) hours as needed for moderate pain.   iron polysaccharides 150 MG capsule Commonly known as: NIFEREX Take 1 capsule (150 mg total) by mouth daily.   metFORMIN 1000 MG tablet Commonly known as: Glucophage Take 1 tablet (1,000 mg total) by mouth 2 (two) times daily with a meal.   nicotine 14  mg/24hr patch Commonly known as: NICODERM CQ - dosed in mg/24 hours Place 14 mg onto the skin daily. What changed: Another medication with the same name was removed. Continue taking this medication, and follow the directions you see here.   pantoprazole 40 MG tablet Commonly known as: PROTONIX Take 1 tablet (40 mg total) by mouth daily.   rosuvastatin 10 MG tablet Commonly known as: CRESTOR Take 1 tablet (10 mg total) by mouth daily at 6 PM.       Discharge Instructions: [facility] Discharge instructions Lower Extremity Bypass Surgery  Please refer to the following instruction for your post-procedure care. Your surgeon or physician assistant will discuss any changes with you.  Activity  You are encouraged to walk as much as you can. You can slowly return to normal activities during the month after  your surgery. Avoid strenuous activity and heavy lifting until your doctor tells you it's OK. Avoid activities such as vacuuming or swinging a golf club. Do not drive until your doctor give the OK and you are no longer taking prescription pain medications. It is also normal to have difficulty with sleep habits, eating and bowel movement after surgery. These will go away with time.  Bathing/Showering  You may shower after you go home. Do not soak in a bathtub, hot tub, or swim until the incision heals completely.  Incision Care  Clean your incision with mild soap and water. Shower every day. Pat the area dry with a clean towel. You do not need a bandage unless otherwise instructed. Do not apply any ointments or creams to your incision. If you have open wounds you will be instructed how to care for them or a visiting nurse may be arranged for you. If you have staples or sutures along your incision they will be removed at your post-op appointment. You may have skin glue on your incision. Do not peel it off. It will come off on its own in about one week.  Wash  the groin wound with soap and water daily and pat dry. (No tub bath-only shower)  Then put a dry gauze or washcloth in the groin to keep this area dry to help prevent wound infection.  Do this daily and as needed.  Do not use Vaseline or neosporin on your incisions.  Only use soap and water on your incisions and then protect and keep dry.  Diet  Resume your normal diet. There are no special food restrictions following this procedure. A low fat/ low cholesterol diet is recommended for all patients with vascular disease. In order to heal from your surgery, it is CRITICAL to get adequate nutrition. Your body requires vitamins, minerals, and protein. Vegetables are the best source of vitamins and minerals. Vegetables also provide the perfect balance of protein. Processed food has little nutritional value, so try to avoid this.  Medications  Resume taking all your medications unless your doctor or Physician Assistant tells you not to. If your incision is causing pain, you may take over-the-counter pain relievers such as acetaminophen (Tylenol). If you were prescribed a stronger pain medication, please aware these medication can cause nausea and constipation. Prevent nausea by taking the medication with a snack or meal. Avoid constipation by drinking plenty of fluids and eating foods with high amount of fiber, such as fruits, vegetables, and grains. Take Colace 100 mg (an over-the-counter stool softener) twice a day as needed for constipation.  Do not take Tylenol if you are taking prescription pain medications.  Follow Up  Our office will schedule a follow up appointment 2-3 weeks following discharge.  Please call us immediately for any of the following conditions  .Severe or worsening pain in your legs or feet while at rest or while walking .Increase pain, redness, warmth, or drainage (pus) from your incision site(s) . Fever of 101 degree or higher . The swelling in your leg with the bypass suddenly  worsens and becomes more painful than when you were in the hospital . If you have been instructed to feel your graft pulse then you should do so every day. If you can no longer feel this pulse, call the office immediately. Not all patients are given this instruction. .  Leg swelling is common after leg bypass surgery.  The swelling should improve over a few months following surgery. To improve  the swelling, you may elevate your legs above the level of your heart while you are sitting or resting. Your surgeon or physician assistant may ask you to apply an ACE wrap or wear compression (TED) stockings to help to reduce swelling.  Reduce your risk of vascular disease  Stop smoking. If you would like help call [facility] at 1-800-QUIT-NOW 6208421627) or [place] at [phone].  . Manage your cholesterol . Maintain a desired weight . Control your diabetes weight . Control your diabetes . Keep your blood pressure down .  If you have any questions, please call the office at [phone]   Prescriptions given: 1.  Hydrocodone- Acetaminophen #30 Take 1 tablet by mouth every 6 hours as needed for moderate pain. No Refill 2.  Clopidogrel #30 Take 1 tablet (75 mg) by mouth daily. 2 refills  Disposition: Home  Patient's condition: is Excellent  Follow up: 1. Dr. Trula Slade in 2-3 weeks with ABI's and left lower extremity arterial duplex   Leontine Locket, PA-C [facility] [phone] 07/04/2019  12:31 PM  - For VQI Registry use ---   Post-op:  Wound infection: No  Graft infection: No  Transfusion: No    If yes, 0 units given New Arrhythmia: No Ipsilateral amputation: Yes, [ X] Minor, _0  BKA, _1  AKA Discharge patency: Valu.Nieves ] Primary, _2  Primary assisted, _3  Secondary, _4  Occluded Patency judged by: _5  Dopper only, _6  Palpable graft pulse, _7  Palpable distal pulse, _8  ABI inc. > 0.15, _9  Duplex D/C Ambulatory Status: Ambulatory  Complications: MI:  No, <[id number]>_6<\/[id number]>_61  Troponin only, _11  EKG or Clinical CHF: No Resp failure:No, _12  Pneumonia, _13  Ventilator Chg in renal function: No, _14  Inc. Cr > 0.5, _15  Temp. Dialysis,  _16  Permanent dialysis Stroke: No, _17  Minor, _18  Major Return to OR: No  Reason for return to OR: _19  Bleeding, _20  Infection, _21  Thrombosis, _22  Revision  Discharge medications: Statin use:  yes ASA use:  yes Plavix use:  yes Beta blocker use: no CCB use:  Yes ACEI use:   yes ARB use:  no Coumadin use: no

## 2019-07-01 NOTE — Evaluation (Signed)
Occupational Therapy Evaluation Patient Details Name: Juan Turner MRN: [id number] DOB: 01/08/1956 Today's Date: 07/01/2019    History of Present Illness 64 yo admitted for left SFA to posterior tibial BPG. PMHx: RT SFA to posterior tibial BPG, DM, PVD, HTN, Rt transmet, anxiety, HLD   Clinical Impression   PTA pt living with spouse, independent for BADL/IADL. At time of eval pt completing transfers at supervision level with RW for external support. Pt able to complete beyond household level of functional mobility without LOB or safety concern. Pt current requires min A to manage LB BADL for LLE due to pain. Wife in room for education and willing to assist as needed in home. Educated pt on importance of sitting on shower chair in shower to maintain safety. Pt has needed DME in the home. No post acute OT follow up indicated, but will continue to follow while acute to progress BADL per POC listed below.    Follow Up Recommendations  No OT follow up;Supervision - Intermittent    Equipment Recommendations  None recommended by OT    Recommendations for Other Services       Precautions / Restrictions Precautions Precautions: Fall Restrictions Weight Bearing Restrictions: No      Mobility Bed Mobility               General bed mobility comments: up in chair  Transfers Overall transfer level: Needs assistance   Transfers: Sit to/from Stand Sit to Stand: Supervision              Balance Overall balance assessment: Needs assistance   Sitting balance-Leahy Scale: Good     Standing balance support: Bilateral upper extremity supported;Single extremity supported Standing balance-Leahy Scale: Poor Standing balance comment: able to stand with single UE assist but benefits from RW for gait                           ADL either performed or assessed with clinical judgement   ADL Overall ADL's : Needs assistance/impaired Eating/Feeding: Independent;Sitting    Grooming: Independent;Sitting   Upper Body Bathing: Independent;Sitting   Lower Body Bathing: Minimal assistance;Sit to/from stand;Sitting/lateral leans   Upper Body Dressing : Independent;Sitting   Lower Body Dressing: Minimal assistance;Sit to/from stand;Sitting/lateral leans Lower Body Dressing Details (indicate cue type and reason): min A to don LLE post up shoedue to pain Toilet Transfer: Min guard;Ambulation;RW   Toileting- Clothing Manipulation and Hygiene: Set up;Sitting/lateral lean;Sit to/from stand   Tub/ Shower Transfer: Min guard;Ambulation;Shower seat;Rolling walker   Functional mobility during ADLs: Min guard;Rolling walker       Vision Patient Visual Report: No change from baseline       Perception     Praxis      Pertinent Vitals/Pain Pain Assessment: 0-10 Pain Score: 3  Pain Location: sore with knee extension LLE Pain Descriptors / Indicators: Sore Pain Intervention(s): Repositioned;Limited activity within patient's tolerance     Hand Dominance     Extremity/Trunk Assessment Upper Extremity Assessment Upper Extremity Assessment: Overall WFL for tasks assessed   Lower Extremity Assessment Lower Extremity Assessment: Defer to PT evaluation       Communication Communication Communication: No difficulties   Cognition Arousal/Alertness: Awake/alert Behavior During Therapy: WFL for tasks assessed/performed Overall Cognitive Status: Within Functional Limits for tasks assessed  General Comments       Exercises     Shoulder Instructions      Home Living Family/patient expects to be discharged to:: Private residence Living Arrangements: Spouse/significant other;Children;Other relatives Available Help at Discharge: Family;Available 24 hours/day Type of Home: House Home Access: Stairs to enter [employer] of Steps: 4 Entrance Stairs-Rails: Left;Right Home Layout: One level      Bathroom Shower/Tub: Chief Strategy Officer: Standard     Home Equipment: Cane - quad;Shower seat;Walker - 2 wheels;Walker - 4 wheels          Prior Functioning/Environment Level of Independence: Independent with assistive device(s)                 OT Problem List: Decreased strength;Decreased knowledge of use of DME or AE;Decreased activity tolerance;Impaired balance (sitting and/or standing);Pain      OT Treatment/Interventions: Self-care/ADL training;Therapeutic exercise;Patient/family education;Balance training;Energy conservation;Therapeutic activities;DME and/or AE instruction    OT Goals(Current goals can be found in the care plan section) Acute Rehab OT Goals Patient Stated Goal: return to independence OT Goal Formulation: With patient Time For Goal Achievement: 07/15/19 Potential to Achieve Goals: Good  OT Frequency: Min 2X/week   Barriers to D/C:            Co-evaluation              AM-PAC OT "6 Clicks" Daily Activity     Outcome Measure Help from another person eating meals?: None Help from another person taking care of personal grooming?: None Help from another person toileting, which includes using toliet, bedpan, or urinal?: A Little Help from another person bathing (including washing, rinsing, drying)?: A Little Help from another person to put on and taking off regular upper body clothing?: None Help from another person to put on and taking off regular lower body clothing?: A Little 6 Click Score: 21   End of Session Equipment Utilized During Treatment: Gait belt;Rolling walker Nurse Communication: Mobility status  Activity Tolerance: Patient tolerated treatment well Patient left: in chair;with call bell/phone within reach;with family/visitor present  OT Visit Diagnosis: Unsteadiness on feet (R26.81);Other abnormalities of gait and mobility (R26.89);Pain Pain - Right/Left: Left Pain - part of body: Leg                Time:  1027-2536 OT Time Calculation (min): 23 min Charges:  OT General Charges $OT Visit: 1 Visit OT Evaluation $OT Eval Low Complexity: 1 Low OT Treatments $Self Care/Home Management : 8-22 mins  Dalphine Handing, MSOT, OTR/L Acute Rehabilitation Services [facility] Office Number: [phone] Pager: [phone]  Dalphine Handing 07/01/2019, 5:53 PM

## 2019-07-01 NOTE — Progress Notes (Signed)
Mobility Specialist - Progress Note   07/01/19 1434  Mobility  Activity Ambulated in hall  Level of Assistance Modified independent, requires aide device or extra time  Assistive Device Front wheel walker  Distance Ambulated (ft) 430 ft  Mobility Response Tolerated well  Mobility performed by Mobility specialist  $Mobility charge 1 Mobility    Pre-mobility: 83 HR, 154/83 BP, 98% SpO2 During mobility: 110 HR, 100% SpO2 Post-mobility: 98% HR, 135/81 BP, 88 SPO2  Pt said his incision site pain has improved and was asx while walking with a RW on room air. I left him in his chair with the chair alarm on and both his call bell and phone at his side.   Mamie Levers Mobility Specialist

## 2019-07-01 NOTE — Progress Notes (Addendum)
  Progress Note    07/01/2019 8:10 AM 1 Day Post-Op  Subjective:  Left leg surgical site pain improving, no other complaints. Wants to get up and ambulate. Hopeful to go home tomorrow   Vitals:   06/30/19 2301 07/01/19 0355  BP: (!) 143/88 127/72  Pulse: 85 80  Resp: 17 18  Temp: 98.5 F (36.9 C) 98.7 F (37.1 C)  SpO2: 100% 99%   Physical Exam: General: well appearing, well nourished, not in any discomfort Cardiac: regular Lungs: non labored Incisions: left lower extremity incisions clean, dry and intact. No hematoma. No ecchymosis Extremities:  2+ femoral pulses bilaterally, bilateral lower extremities well perfused. Dp/Pt doppler signals. Abdomen:  Soft, non tender, non distended Neurologic:alert and oriented  CBC    Component Value Date/Time   WBC 7.7 07/01/2019 0256   RBC 3.76 (L) 07/01/2019 0256   HGB 9.6 (L) 07/01/2019 0256   HCT 30.0 (L) 07/01/2019 0256   HCT 44 (A) 11/14/2015 0758   PLT 206 07/01/2019 0256   MCV 79.8 (L) 07/01/2019 0256   MCV 87.8 11/14/2015 0758   MCH 25.5 (L) 07/01/2019 0256   MCHC 32.0 07/01/2019 0256   RDW 14.9 07/01/2019 0256   RDW 13.7 11/14/2015 0758   LYMPHSABS 1.0 04/09/2019 0820   MONOABS 0.7 04/09/2019 0820   EOSABS 0.1 04/09/2019 0820   EOSABS 180 11/14/2015 0758   BASOSABS 0.0 04/09/2019 0820    BMET    Component Value Date/Time   NA 135 07/01/2019 0256   NA 137 11/14/2015 0758   K 4.1 07/01/2019 0256   K 4.1 11/14/2015 0758   CL 103 07/01/2019 0256   CL 102 11/14/2015 0758   CO2 23 07/01/2019 0256   CO2 26 11/14/2015 0758   GLUCOSE 293 (H) 07/01/2019 0256   BUN 10 07/01/2019 0256   CREATININE 0.88 07/01/2019 0256   CREATININE 1.03 11/14/2015 0758   CALCIUM 8.3 (L) 07/01/2019 0256   CALCIUM 9.1 11/14/2015 0758   GFRNONAA >60 07/01/2019 0256   GFRAA >60 07/01/2019 0256    INR    Component Value Date/Time   INR 0.9 06/30/2019 0623     Intake/Output Summary (Last 24 hours) at 07/01/2019 0810 Last data  filed at 07/01/2019 0610 Gross per 24 hour  Intake 2202 ml  Output 4400 ml  Net -2198 ml     Assessment/Plan:  64 y.o. male is s/p left SFA to posterior tibial bypass with ipsilateral translocated non reversed greater saphenous vein 1 Day Post-Op. Left leg well perfused and warm. Doppler DP/ PT signals. Will continue to allow 2nd toe to demarcate. Tolerating diet. Afebrile. Ambulate as tolerated today. PT/ OT to evaluate. Possible D/c tomorrow  DVT prophylaxis:  Sq heparin   Graceann Congress, PA-C [facility] [phone] 07/01/2019 8:10 AM   I have independently interviewed and examined patient and agree with PA assessment and plan above.   Azelie Noguera C. Randie Heinz, MD [facility] [place] Office: [phone] Pager: [phone]

## 2019-07-01 NOTE — Evaluation (Signed)
Physical Therapy Evaluation Patient Details Name: Juan Turner MRN: [id number] DOB: 1955-06-22 Today's Date: 07/01/2019   History of Present Illness  64 yo admitted for left SFA to posterior tibial BPG. PMHx: RT SFA to posterior tibial BPG, DM, PVD, HTN, Rt transmet, anxiety, HLD  Clinical Impression  Pt very pleasant and wanting to walk and hopeful for return home tomorrow. Pt with right transmet and wears darco shoe at baseline with education to follow up with surgeon for transition to orthopedic insert. Pt with decreased strength, gait and ROM educated for HEP post op to maximize ROM and strength. Pt with decreased activity tolerance and independence who will benefit from acute therapy to maximize mobility, function and safety for return home.     Follow Up Recommendations No PT follow up    Equipment Recommendations  None recommended by PT    Recommendations for Other Services       Precautions / Restrictions Precautions Precautions: Fall      Mobility  Bed Mobility Overal bed mobility: Modified Independent                Transfers Overall transfer level: Needs assistance   Transfers: Sit to/from Stand Sit to Stand: Min guard         General transfer comment: guarding for lines and safety, slight imbalance with initial standing  Ambulation/Gait Ambulation/Gait assistance: Min guard Gait Distance (Feet): 300 Feet Assistive device: Rolling walker (2 wheeled) Gait Pattern/deviations: Step-through pattern;Decreased stride length   Gait velocity interpretation: >2.62 ft/sec, indicative of community ambulatory General Gait Details: decreased heel strike on LLE, darco shoe with transmet on RLE impairing sequence of gait. cues for proximity to RW  Stairs Stairs: Yes Stairs assistance: Supervision Stair Management: Forwards;Alternating pattern;Two rails Number of Stairs: 8 General stair comments: pt continued up stairs after education to turn around at stair  4. reliant on rails  Wheelchair Mobility    Modified Rankin (Stroke Patients Only)       Balance Overall balance assessment: Needs assistance   Sitting balance-Leahy Scale: Good     Standing balance support: Bilateral upper extremity supported;Single extremity supported Standing balance-Leahy Scale: Poor Standing balance comment: able to stand with single UE assist but benefits from RW for gait                             Pertinent Vitals/Pain Pain Assessment: 0-10 Pain Score: 3  Pain Location: sore with knee extension LLE Pain Descriptors / Indicators: Sore Pain Intervention(s): Limited activity within patient's tolerance;Repositioned    Home Living Family/patient expects to be discharged to:: Private residence Living Arrangements: Spouse/significant other;Children;Other relatives Available Help at Discharge: Family;Available 24 hours/day Type of Home: House Home Access: Stairs to enter Entrance Stairs-Rails: Chemical engineer of Steps: 4 Home Layout: One level Home Equipment: Cane - quad;Shower seat;Walker - 2 wheels;Walker - 4 wheels      Prior Function Level of Independence: Independent with assistive device(s)               Hand Dominance        Extremity/Trunk Assessment   Upper Extremity Assessment Upper Extremity Assessment: Overall WFL for tasks assessed    Lower Extremity Assessment Lower Extremity Assessment: LLE deficits/detail;RLE deficits/detail RLE Deficits / Details: transmet LLE Deficits / Details: decreased knee extension due to post op pain    Cervical / Trunk Assessment Cervical / Trunk Assessment: Normal  Communication   Communication: No difficulties  Cognition Arousal/Alertness: Awake/alert Behavior During Therapy: WFL for tasks assessed/performed Overall Cognitive Status: Within Functional Limits for tasks assessed                                        General Comments       Exercises Total Joint Exercises Ankle Circles/Pumps: Left;Seated;10 reps Long Arc Quad: AROM;Left;Seated;10 reps Marching in Standing: AROM;Left;Seated;10 reps   Assessment/Plan    PT Assessment Patient needs continued PT services  PT Problem List Decreased strength;Decreased mobility;Decreased range of motion;Decreased activity tolerance;Decreased balance;Decreased knowledge of use of DME;Decreased skin integrity       PT Treatment Interventions Gait training;DME instruction;Therapeutic exercise;Functional mobility training;Therapeutic activities;Patient/family education;Balance training    PT Goals (Current goals can be found in the Care Plan section)  Acute Rehab PT Goals Patient Stated Goal: return home PT Goal Formulation: With patient Time For Goal Achievement: 07/15/19 Potential to Achieve Goals: Good    Frequency Min 3X/week   Barriers to discharge        Co-evaluation               AM-PAC PT "6 Clicks" Mobility  Outcome Measure Help needed turning from your back to your side while in a flat bed without using bedrails?: None Help needed moving from lying on your back to sitting on the side of a flat bed without using bedrails?: A Little Help needed moving to and from a bed to a chair (including a wheelchair)?: A Little Help needed standing up from a chair using your arms (e.g., wheelchair or bedside chair)?: A Little Help needed to walk in hospital room?: A Little Help needed climbing 3-5 steps with a railing? : A Little 6 Click Score: 19    End of Session Equipment Utilized During Treatment: Gait belt Activity Tolerance: Patient tolerated treatment well Patient left: in chair;with call bell/phone within reach;with chair alarm set Nurse Communication: Mobility status PT Visit Diagnosis: Other abnormalities of gait and mobility (R26.89);Difficulty in walking, not elsewhere classified (R26.2)    Time: 7893-8101 PT Time Calculation (min) (ACUTE ONLY): 29  min   Charges:   PT Evaluation $PT Eval Moderate Complexity: 1 Mod PT Treatments $Gait Training: 8-22 mins        Raymonde Hamblin P, PT Acute Rehabilitation Services Pager: [phone] Office: [phone]   Johnston Maddocks B Khy Pitre 07/01/2019, 1:13 PM

## 2019-07-02 DIAGNOSIS — E1152 Type 2 diabetes mellitus with diabetic peripheral angiopathy with gangrene: Secondary | ICD-10-CM | POA: Diagnosis not present

## 2019-07-02 DIAGNOSIS — E1151 Type 2 diabetes mellitus with diabetic peripheral angiopathy without gangrene: Secondary | ICD-10-CM | POA: Diagnosis not present

## 2019-07-02 LAB — GLUCOSE, CAPILLARY: Glucose-Capillary: 232 mg/dL — ABNORMAL HIGH (ref 70–99)

## 2019-07-02 MED ORDER — CLOPIDOGREL BISULFATE 75 MG PO TABS: 75.0000 mg | ORAL_TABLET | Freq: Every day | ORAL | 2 refills | Status: DC

## 2019-07-02 MED ORDER — HYDROCODONE-ACETAMINOPHEN 5-325 MG PO TABS: 1.0000 | ORAL_TABLET | Freq: Four times a day (QID) | ORAL | 0 refills | Status: AC | PRN

## 2019-07-02 NOTE — Progress Notes (Addendum)
  Progress Note    07/02/2019 8:27 AM 2 Days Post-Op  Subjective:  No complaints; wants to go home.   Afebrile HR 70's-80's NSR 140's-150's systolic 100% RA  Vitals:   07/02/19 0020 07/02/19 0420  BP: (!) 153/87 (!) 151/69  Pulse: 92 80  Resp: 20 20  Temp: 98.2 F (36.8 C) 98.4 F (36.9 C)  SpO2: 100% 100%    Physical Exam: Cardiac:  regular Lungs:  Non labored Incisions:  AK and BK incisions are clean and dry Extremities:  Left foot warm and well perfused.  Palpable PT pulse.   CBC    Component Value Date/Time   WBC 7.7 07/01/2019 0256   RBC 3.76 (L) 07/01/2019 0256   HGB 9.6 (L) 07/01/2019 0256   HCT 30.0 (L) 07/01/2019 0256   HCT 44 (A) 11/14/2015 0758   PLT 206 07/01/2019 0256   MCV 79.8 (L) 07/01/2019 0256   MCV 87.8 11/14/2015 0758   MCH 25.5 (L) 07/01/2019 0256   MCHC 32.0 07/01/2019 0256   RDW 14.9 07/01/2019 0256   RDW 13.7 11/14/2015 0758   LYMPHSABS 1.0 04/09/2019 0820   MONOABS 0.7 04/09/2019 0820   EOSABS 0.1 04/09/2019 0820   EOSABS 180 11/14/2015 0758   BASOSABS 0.0 04/09/2019 0820    BMET    Component Value Date/Time   NA 135 07/01/2019 0256   NA 137 11/14/2015 0758   K 4.1 07/01/2019 0256   K 4.1 11/14/2015 0758   CL 103 07/01/2019 0256   CL 102 11/14/2015 0758   CO2 23 07/01/2019 0256   CO2 26 11/14/2015 0758   GLUCOSE 293 (H) 07/01/2019 0256   BUN 10 07/01/2019 0256   CREATININE 0.88 07/01/2019 0256   CREATININE 1.03 11/14/2015 0758   CALCIUM 8.3 (L) 07/01/2019 0256   CALCIUM 9.1 11/14/2015 0758   GFRNONAA >60 07/01/2019 0256   GFRAA >60 07/01/2019 0256    INR    Component Value Date/Time   INR 0.9 06/30/2019 0623     Intake/Output Summary (Last 24 hours) at 07/02/2019 0827 Last data filed at 07/02/2019 0647 Gross per 24 hour  Intake 120 ml  Output 1425 ml  Net -1305 ml     Assessment:  64 y.o. male is s/p:  left SFA to posterior tibial bypass with ipsilateral translocated non reversed greater saphenous vein    2 Days Post-Op  Plan: -pt with palpable left PT pulse. -doing well and will dc home today and f/u with Dr. Myra Gianotti in 2-3 weeks. -continue asa/statin -Rx for Norco and Plavix to [facility] in [place], [place] [facility] [phone] 07/02/2019 8:27 AM    I have interviewed and examine patient with PA and agree with assessment and plan above.   Larance Ratledge C. Randie Heinz, MD [facility] [place] Office: [phone] Pager: [phone]

## 2019-07-02 NOTE — Discharge Instructions (Signed)
Endovascular Therapy for Peripheral Arterial Disease, Care After This sheet gives you information about how to care for yourself after your procedure. Your health care provider may also give you more specific instructions. If you have problems or questions, contact your health care provider. What can I expect after the procedure? After the procedure, it is common to have:  Pain.  Soreness and bruising around your puncture or incision (access site).  Fatigue. Follow these instructions at home: Access site care  Follow instructions from your health care provider about how to take care of your access site. Make sure you: ? Wash your hands with soap and water before you change your bandage (dressing). If soap and water are not available, use hand sanitizer. ? Change your dressing as told by your health care provider. ? Leave stitches (sutures), skin glue, or adhesive strips in place. If adhesive strip edges start to loosen and curl up, you may trim the loose edges. Do not remove adhesive strips or skin glue completely unless your health care provider tells you to do that.  Check your access site every day for signs of infection. Check for: ? Redness, swelling, or pain. ? A lump or bump. ? Fluid or blood. ? Warmth. ? Pus or a bad smell. Medicines  Take over-the-counter and prescription medicines only as told by your health care provider. You may need to take medicines to prevent blood clots and to lower your cholesterol.  If you were prescribed antibiotic medicine, take it as told by your health care provider. Do not stop taking the antibiotic even if you start to feel better. Driving  Do not drive until your health care provider approves. You should: ? Not drive for 24 hours if you were given a medicine to help you relax (sedative) during your procedure. ? Not drive or use heavy machinery while taking prescription pain medicine. Activity  Do not lift anything that is heavier than  10 lb (4.5 kg) until your health care provider says that it is safe. You may have this lifting limit for several days.  Return to your normal activities as told by your health care provider. Ask your health care provider what activities are safe for you. ? Avoid activity that requires a lot of energy, such as exercise and sports, as told by your health care provider. ? Avoid sexual activity until your health care provider says it is safe.  Follow your exercise plan as told by your health care provider. Eating and drinking   Drink fluids as instructed to help wash (flush) dye used during the procedure out of your body.  Follow instructions from your health care provider about eating or drinking restrictions. You may need to eat a diet that is low in salt (sodium) and fat.  Avoid drinking alcohol. General instructions  Do not take baths, swim, or use a hot tub until your health care provider approves. You may take showers.  Do not use any products that contain nicotine or tobacco, such as cigarettes and e-cigarettes. If you need help quitting, ask your health care provider.  Keep all follow-up visits as told by your health care provider. This is important. Contact a health care provider if:  You have a fever.  You have severe pain that does not get better with medicine.  You have redness, swelling, or pain around your access site.  You have a fever.  You have a lump or bump at your access site. Get help right away if:  You have fluid or blood coming from your access site. If this happens, lie down on your back and apply pressure to the area.  You have chest pain.  You have problems breathing.  You have pain, numbness, or tingling in your legs.  You faint.  You have any symptoms of a stroke. "BE FAST" is an easy way to remember the main warning signs of a stroke: ? B - Balance. Signs are dizziness, sudden trouble walking, or loss of balance. ? E - Eyes. Signs are trouble  seeing or a sudden change in vision. ? F - Face. Signs are sudden weakness or numbness of the face, or the face or eyelid drooping on one side. ? A - Arms. Signs are weakness or numbness in an arm. This happens suddenly and usually on one side of the body. ? S - Speech. Signs are sudden trouble speaking, slurred speech, or trouble understanding what people say. ? T - Time. Time to call emergency services. Write down what time symptoms started.  You have other signs of a stroke, such as: ? A sudden, severe headache with no known cause. ? Nausea or vomiting. ? Seizure. These symptoms may represent a serious problem that is an emergency. Do not wait to see if the symptoms will go away. Get medical help right away. Call your local emergency services (911 in the [place] Do not drive yourself to the hospital. Summary  After the procedure, it is common to have pain and soreness near your puncture or incision (access site).  Check your access site every day for signs of infection, such as redness, swelling, or pain.  You may need to take medicines to prevent blood clots and to lower your cholesterol.  If you have any signs of a stroke, get help right away. This information is not intended to replace advice given to you by your health care provider. Make sure you discuss any questions you have with your health care provider. Document Revised: 01/22/2018 Document Reviewed: 05/21/2016 Elsevier Patient Education  2020 [employer].   [facility]  Discharge instructions  Lower Extremity Bypass Surgery  Please refer to the following instruction for your post-procedure care. Your surgeon or physician assistant will discuss any changes with you.  Activity  You are encouraged to walk as much as you can. You can slowly return to normal activities during the month after your surgery. Avoid strenuous activity and heavy lifting until your doctor tells you it's OK. Avoid  activities such as vacuuming or swinging a golf club. Do not drive until your doctor give the OK and you are no longer taking prescription pain medications. It is also normal to have difficulty with sleep habits, eating and bowel movement after surgery. These will go away with time.  Bathing/Showering  Shower daily after you go home. Do not soak in a bathtub, hot tub, or swim until the incision heals completely.  Incision Care  Clean your incision with mild soap and water. Shower every day. Pat the area dry with a clean towel. You do not need a bandage unless otherwise instructed. Do not apply any ointments or creams to your incision. If you have open wounds you will be instructed how to care for them or a visiting nurse may be arranged for you. If you have staples or sutures along your incision they will be removed at your post-op appointment. You may have skin glue on your incision. Do not peel it off. It  will come off on its own in about one week.  Wash the groin wound with soap and water daily and pat dry. (No tub bath-only shower)  Then put a dry gauze or washcloth in the groin to keep this area dry to help prevent wound infection.  Do this daily and as needed.  Do not use Vaseline or neosporin on your incisions.  Only use soap and water on your incisions and then protect and keep dry.  Diet  Resume your normal diet. There are no special food restrictions following this procedure. A low fat/ low cholesterol diet is recommended for all patients with vascular disease. In order to heal from your surgery, it is CRITICAL to get adequate nutrition. Your body requires vitamins, minerals, and protein. Vegetables are the best source of vitamins and minerals. Vegetables also provide the perfect balance of protein. Processed food has little nutritional value, so try to avoid this.  Medications  Resume taking all your medications unless your doctor or physician assistant tells you not to. If your incision  is causing pain, you may take over-the-counter pain relievers such as acetaminophen (Tylenol). If you were prescribed a stronger pain medication, please aware these medication can cause nausea and constipation. Prevent nausea by taking the medication with a snack or meal. Avoid constipation by drinking plenty of fluids and eating foods with high amount of fiber, such as fruits, vegetables, and grains. Take Colace 100 mg (an over-the-counter stool softener) twice a day as needed for constipation.  Do not take Tylenol if you are taking prescription pain medications.  Follow Up  Our office will schedule a follow up appointment 2-3 weeks following discharge.  Please call us immediately for any of the following conditions  .Severe or worsening pain in your legs or feet while at rest or while walking .Increase pain, redness, warmth, or drainage (pus) from your incision site(s) . Fever of 101 degree or higher . The swelling in your leg with the bypass suddenly worsens and becomes more painful than when you were in the hospital . If you have been instructed to feel your graft pulse then you should do so every day. If you can no longer feel this pulse, call the office immediately. Not all patients are given this instruction. .  Leg swelling is common after leg bypass surgery.  The swelling should improve over a few months following surgery. To improve the swelling, you may elevate your legs above the level of your heart while you are sitting or resting. Your surgeon or physician assistant may ask you to apply an ACE wrap or wear compression (TED) stockings to help to reduce swelling.  Reduce your risk of vascular disease  Stop smoking. If you would like help call [facility] at 1-800-QUIT-NOW ((443) 681-2746) or [facility] at 708-634-4110.  . Manage your cholesterol . Maintain a desired weight . Control your diabetes weight . Control your diabetes . Keep your blood pressure down .  If you have any  questions, please call the office at [phone]

## 2019-07-02 NOTE — Progress Notes (Signed)
Pt discharged home. RN reviewed discharge instruction packet with pt that included medications, follow up care, and wound care. Pt understands without assistance. IV & telebox removed without difficulty. Pt assisted to get dressed, awaiting ride.

## 2019-08-01 ENCOUNTER — Other Ambulatory Visit: Payer: Self-pay | Admitting: *Deleted

## 2019-08-01 DIAGNOSIS — I739 Peripheral vascular disease, unspecified: Secondary | ICD-10-CM

## 2019-08-01 DIAGNOSIS — Z95828 Presence of other vascular implants and grafts: Secondary | ICD-10-CM

## 2019-08-03 ENCOUNTER — Other Ambulatory Visit: Payer: Self-pay | Admitting: *Deleted

## 2019-08-03 DIAGNOSIS — Z95828 Presence of other vascular implants and grafts: Secondary | ICD-10-CM

## 2019-08-03 DIAGNOSIS — I739 Peripheral vascular disease, unspecified: Secondary | ICD-10-CM

## 2019-08-12 ENCOUNTER — Ambulatory Visit (HOSPITAL_COMMUNITY)
Admission: RE | Admit: 2019-08-12 | Discharge: 2019-08-12 | Disposition: A | Discharge status: Home / Self Care | Payer: Medicare Other | Source: Ambulatory Visit | Attending: Vascular Surgery | Admitting: Vascular Surgery

## 2019-08-12 ENCOUNTER — Ambulatory Visit (INDEPENDENT_AMBULATORY_CARE_PROVIDER_SITE_OTHER)
Admission: RE | Admit: 2019-08-12 | Discharge: 2019-08-12 | Disposition: A | Discharge status: Home / Self Care | Payer: Medicare Other | Source: Ambulatory Visit | Attending: Vascular Surgery | Admitting: Vascular Surgery

## 2019-08-12 ENCOUNTER — Encounter: Payer: Self-pay | Admitting: Vascular Surgery

## 2019-08-12 ENCOUNTER — Other Ambulatory Visit: Payer: Self-pay

## 2019-08-12 ENCOUNTER — Ambulatory Visit (INDEPENDENT_AMBULATORY_CARE_PROVIDER_SITE_OTHER): Payer: Medicare Other | Admitting: Vascular Surgery

## 2019-08-12 VITALS — BP 154/90 | HR 75 | Temp 97.6°F | Resp 20 | Ht 72.0 in | Wt 147.0 lb

## 2019-08-12 DIAGNOSIS — Z95828 Presence of other vascular implants and grafts: Secondary | ICD-10-CM | POA: Diagnosis not present

## 2019-08-12 DIAGNOSIS — I739 Peripheral vascular disease, unspecified: Secondary | ICD-10-CM | POA: Insufficient documentation

## 2019-08-12 NOTE — Progress Notes (Signed)
°  ° °  Subjective:     Patient ID: [name], male   DOB: 12/20/1955, 64 y.o.   MRN: [id number]  HPI 64 year old male follows up after left SFA to PT bypass with vein.  This is after a right SFA to PT bypass with vein.  He has now healed right transmetatarsal amputation.  Left side was done for second toe ulceration which is now healing well.  He does have some swelling.  He is walking without limitation at this point.  He is on aspirin, Plavix and a statin   Review of Systems Left leg swelling    Objective:   Physical Exam Vitals:   08/12/19 1409  BP: (!) 154/90  Pulse: 75  Resp: 20  Temp: 97.6 F (36.4 C)  SpO2: 100%  Awake alert oriented Bilateral lower extremity incisions healing well Right TMA well-healed Right posterior tibial is palpable Left posterior tibial with strong signal that can be traced out to the distal foot Left second toe ulceration healing well foot has much better color today   .  Left ABI 1.17, left bypass graft duplex with multiphasic flow throughout   Assessment:   64 year old male status post bilateral SFA to posterior tibial bypasses with vein.  He is doing very well at this time.  He will follow-up in 9 months with ABIs and bilateral lower extremity duplexes.    Plan:     Follow-up in 9 months with bilateral lower extremity graft duplexes and ABIs.  Bergen Magner C. Randie Heinz, MD [facility] [place] Office: [phone] Pager: [phone]

## 2019-08-16 ENCOUNTER — Other Ambulatory Visit: Payer: Self-pay | Admitting: *Deleted

## 2019-08-16 DIAGNOSIS — I739 Peripheral vascular disease, unspecified: Secondary | ICD-10-CM

## 2019-08-16 DIAGNOSIS — Z95828 Presence of other vascular implants and grafts: Secondary | ICD-10-CM

## 2019-09-09 ENCOUNTER — Encounter: Payer: Self-pay | Admitting: Cardiology

## 2019-09-09 ENCOUNTER — Ambulatory Visit: Payer: Medicaid Other | Admitting: Cardiology

## 2019-09-09 NOTE — Progress Notes (Deleted)
Cardiology Office Note  Date: 09/09/2019   ID: Juan Turner, DOB Nov 27, 1955, MRN [id number]  PCP:  Wanita Chamberlain, PA-C  Cardiologist:  No primary care provider on file. Electrophysiologist:  None   No chief complaint on file.   History of Present Illness: Juan Turner is a 64 y.o. male referred for cardiology consultation by Ms. Boles PA-C with [facility] with diagnosis of aortic stenosis.  Echocardiogram done at [facility] back in April of this year reported upper normal LV wall thickness with LVEF 60 to 35%, grade 1 diastolic dysfunction, mildly thickened mitral leaflets with mild mitral regurgitation, moderate to severe aortic stenosis leaflet structure reportedly difficult to define, mean gradient 15 mmHg with dimensionless index 0.28-0.30.  Patient follows with Dr. Donzetta Matters with [facility].  He has PAD with history of left SFA to PT bypass and also right SFA to PT bypass, right transmetatarsal amputation.  He is on dual antiplatelet therapy.  Need family history  Past Medical History:  Diagnosis Date  . Anemia   . Anxiety   . Aortic stenosis   . Essential hypertension   . GERD (gastroesophageal reflux disease)   . Mixed hyperlipidemia   . PAD (peripheral artery disease) ([place])   . Type 2 diabetes mellitus ([place])     Past Surgical History:  Procedure Laterality Date  . ABDOMINAL AORTOGRAM W/LOWER EXTREMITY N/A 04/05/2019   Procedure: ABDOMINAL AORTOGRAM W/LOWER EXTREMITY;  Surgeon: Elam Dutch, MD;  Location: [place] [facility];  Service: Cardiovascular;  Laterality: N/A;  . ABDOMINAL AORTOGRAM W/LOWER EXTREMITY Bilateral 06/06/2019   Procedure: ABDOMINAL AORTOGRAM W/LOWER EXTREMITY;  Surgeon: Waynetta Sandy, MD;  Location: [place] [facility];  Service: Cardiovascular;  Laterality: Bilateral;  . AMPUTATION Right 04/07/2019   Procedure: AMPUTATION DIGIT RIGHT FIRST AND SECOND TOES;  Surgeon: Waynetta Sandy, MD;  Location: [place];  Service:  Vascular;  Laterality: Right;  . FEMORAL-TIBIAL BYPASS GRAFT Right 04/07/2019   Procedure: LEFT FEMORAL POSTERIOR TIBIAL BYPASS;  Surgeon: Waynetta Sandy, MD;  Location: [place];  Service: Vascular;  Laterality: Right;  . FEMORAL-TIBIAL BYPASS GRAFT Left 06/30/2019   Procedure: LEFT BYPASS GRAFT SUPERFICIAL FEMORAL-Posterior TIBIAL ARTERY using nonreversed saphenous vein;  Surgeon: Waynetta Sandy, MD;  Location: [place];  Service: Vascular;  Laterality: Left;  . HERNIA REPAIR    . TRANSMETATARSAL AMPUTATION Right 04/28/2019   Right transmetatarsal amputation  . TRANSMETATARSAL AMPUTATION Right 04/28/2019   Procedure: TRANSMETATARSAL AMPUTATION;  Surgeon: Waynetta Sandy, MD;  Location: [place];  Service: Vascular;  Laterality: Right;    Current Outpatient Medications  Medication Sig Dispense Refill  . amLODipine-benazepril (LOTREL) 5-10 MG per capsule Take 1 capsule by mouth daily.    Marland Kitchen ammonium lactate (AMLACTIN) 12 % cream Apply topically as needed for dry skin. Apply only to the heel, not to an open wound 385 g 0  . aspirin EC 81 MG EC tablet Take 1 tablet (81 mg total) by mouth daily. 30 tablet 0  . blood glucose meter kit and supplies KIT Dispense based on patient and insurance preference. Use up to four times daily as directed. (FOR ICD-9 250.00, 250.01). 1 each 0  . clopidogrel (PLAVIX) 75 MG tablet Take 1 tablet (75 mg total) by mouth daily at 6 (six) AM. 30 tablet 2  . docusate sodium (COLACE) 100 MG capsule Take 1 capsule (100 mg total) by mouth daily. 10 capsule 0  . glipiZIDE (GLUCOTROL) 5 MG tablet Take 1 tablet (  5 mg total) by mouth daily before breakfast. 30 tablet 0  . HYDROcodone-acetaminophen (NORCO/VICODIN) 5-325 MG tablet Take 1 tablet by mouth every 6 (six) hours as needed for moderate pain. 30 tablet 0  . iron polysaccharides (NIFEREX) 150 MG capsule Take 1 capsule (150 mg total) by mouth daily. 30 capsule 6  . metFORMIN (GLUCOPHAGE) 1000 MG tablet  Take 1 tablet (1,000 mg total) by mouth 2 (two) times daily with a meal. 60 tablet 0  . nicotine (NICODERM CQ - DOSED IN MG/24 HOURS) 14 mg/24hr patch Place 14 mg onto the skin daily.    . pantoprazole (PROTONIX) 40 MG tablet Take 1 tablet (40 mg total) by mouth daily. 30 tablet 0  . rosuvastatin (CRESTOR) 10 MG tablet Take 1 tablet (10 mg total) by mouth daily at 6 PM. 30 tablet 0   No current facility-administered medications for this visit.   Allergies:  Patient has no known allergies.   Social History: The patient  reports that he has quit smoking. His smoking use included cigarettes. He smoked 1.00 pack per day. He has never used smokeless tobacco. He reports previous alcohol use of about 1.0 - 2.0 standard drink of alcohol per week. He reports that he does not use drugs.   Family History: The patient's family history is not on file.   ROS:  Please see the history of present illness. Otherwise, complete review of systems is positive for {NONE DEFAULTED:18576::"none"}.  All other systems are reviewed and negative.   Physical Exam: VS:  There were no vitals taken for this visit., BMI There is no height or weight on file to calculate BMI.  Wt Readings from Last 3 Encounters:  08/12/19 147 lb (66.7 kg)  06/30/19 155 lb (70.3 kg)  06/06/19 158 lb (71.7 kg)    General: Patient appears comfortable at rest. HEENT: Conjunctiva and lids normal, oropharynx clear with moist mucosa. Neck: Supple, no elevated JVP or carotid bruits, no thyromegaly. Lungs: Clear to auscultation, nonlabored breathing at rest. Cardiac: Regular rate and rhythm, no S3 or significant systolic murmur, no pericardial rub. Abdomen: Soft, nontender, no hepatomegaly, bowel sounds present, no guarding or rebound. Extremities: No pitting edema, distal pulses 2+. Skin: Warm and dry. Musculoskeletal: No kyphosis. Neuropsychiatric: Alert and oriented x3, affect grossly appropriate.  ECG:  An ECG dated 04/04/2019 was  personally reviewed today and demonstrated:  Sinus rhythm with increased voltage.  Recent Labwork: 04/09/2019: Magnesium 2.0 06/30/2019: ALT 25; AST 23 07/01/2019: BUN 10; Creatinine, Ser 0.88; Hemoglobin 9.6; Platelets 206; Potassium 4.1; Sodium 135     Component Value Date/Time   CHOL 214 11/14/2015 0758   TRIG 214 (A) 11/14/2015 0758   HDL 47 11/14/2015 0758   CHOLHDL 4.6 11/14/2015 0758   LDLCALC 124 11/14/2015 0758  June 2021: Hemoglobin 12.5, platelets 217, BUN 12, creatinine 1.1, potassium 4.4, AST 23, ALT 17, hemoglobin A1c 6.8%  Other Studies Reviewed Today:  Lower extremity ABIs 08/12/2019: Summary:  Left: Resting left ankle-brachial index is within normal range. No  evidence of significant left lower extremity arterial disease. The left  toe-brachial index is abnormal.   Lower extremity arterial Doppler 08/12/2019: Summary:  Left:  Marked improvement is noted compared to previous study.  Patent left femoral to posterior tibial artery bypass graft with no focal  stenosis.   See ABI report   Assessment and Plan:   Medication Adjustments/Labs and Tests Ordered: Current medicines are reviewed at length with the patient today.  Concerns regarding medicines are  outlined above.   Tests Ordered: No orders of the defined types were placed in this encounter.   Medication Changes: No orders of the defined types were placed in this encounter.   Disposition:  Follow up {follow up:15908}  Signed, Satira Sark, MD, [employer] 09/09/2019 12:45 PM    [facility] at [facility] [address], [place], [place] [address] Phone: [phone]; Fax: [phone]

## 2019-10-13 ENCOUNTER — Other Ambulatory Visit: Payer: Self-pay | Admitting: Physician Assistant

## 2019-11-14 ENCOUNTER — Encounter: Payer: Self-pay | Admitting: *Deleted

## 2019-11-14 NOTE — Progress Notes (Deleted)
Cardiology Office Note  Date: 11/14/2019   ID: Juan Turner, DOB [birth year], MRN [id number]  PCP:  Wanita Chamberlain, PA-C  Cardiologist:  Rozann Lesches, MD Electrophysiologist:  None   No chief complaint on file.   History of Present Illness: Juan Turner is a 64 y.o. male referred for cardiology consultation by Ms. Bowles PA-C with [facility] with history of aortic stenosis.  He follows with Dr. Donzetta Matters with [facility], history of PAD status post bilateral SFA to posterior tibial bypasses.  He had an echocardiogram done at [facility] back in April reporting LVEF 60 to 65% with mild diastolic dysfunction, mild mitral regurgitation, and moderate to severe aortic stenosis.  Aortic valve described as possibly bicuspid, mean gradient 15 mmHg, dimensionless index 0.28-0.30.  Past Medical History:  Diagnosis Date   Anemia    Anxiety    Aortic stenosis    Essential hypertension    GERD (gastroesophageal reflux disease)    Mixed hyperlipidemia    PAD (peripheral artery disease) (H[facility])    Type 2 diabetes mellitus ([place])     Past Surgical History:  Procedure Laterality Date   ABDOMINAL AORTOGRAM W/LOWER EXTREMITY N/A 04/05/2019   Procedure: ABDOMINAL AORTOGRAM W/LOWER EXTREMITY;  Surgeon: Elam Dutch, MD;  Location: [place] [facility];  Service: Cardiovascular;  Laterality: N/A;   ABDOMINAL AORTOGRAM W/LOWER EXTREMITY Bilateral 06/06/2019   Procedure: ABDOMINAL AORTOGRAM W/LOWER EXTREMITY;  Surgeon: Waynetta Sandy, MD;  Location: [place] [facility];  Service: Cardiovascular;  Laterality: Bilateral;   AMPUTATION Right 04/07/2019   Procedure: AMPUTATION DIGIT RIGHT FIRST AND SECOND TOES;  Surgeon: Waynetta Sandy, MD;  Location: [place];  Service: Vascular;  Laterality: Right;   FEMORAL-TIBIAL BYPASS GRAFT Right 04/07/2019   Procedure: LEFT FEMORAL POSTERIOR TIBIAL BYPASS;  Surgeon: Waynetta Sandy, MD;  Location: [place];  Service: Vascular;   Laterality: Right;   FEMORAL-TIBIAL BYPASS GRAFT Left 06/30/2019   Procedure: LEFT BYPASS GRAFT SUPERFICIAL FEMORAL-Posterior TIBIAL ARTERY using nonreversed saphenous vein;  Surgeon: Waynetta Sandy, MD;  Location: [place];  Service: Vascular;  Laterality: Left;   HERNIA REPAIR     TRANSMETATARSAL AMPUTATION Right 04/28/2019   Right transmetatarsal amputation   TRANSMETATARSAL AMPUTATION Right 04/28/2019   Procedure: TRANSMETATARSAL AMPUTATION;  Surgeon: Waynetta Sandy, MD;  Location: [place];  Service: Vascular;  Laterality: Right;    Current Outpatient Medications  Medication Sig Dispense Refill   amLODipine-benazepril (LOTREL) 5-10 MG per capsule Take 1 capsule by mouth daily.     ammonium lactate (AMLACTIN) 12 % cream Apply topically as needed for dry skin. Apply only to the heel, not to an open wound 385 g 0   aspirin 325 MG tablet Take 325 mg by mouth daily.     blood glucose meter kit and supplies KIT Dispense based on patient and insurance preference. Use up to four times daily as directed. (FOR ICD-9 250.00, 250.01). 1 each 0   clopidogrel (PLAVIX) 75 MG tablet TAKE 1 TABLET(75 MG) BY MOUTH DAILY AT 6 AM 30 tablet 2   docusate sodium (COLACE) 100 MG capsule Take 1 capsule (100 mg total) by mouth daily. 10 capsule 0   glipiZIDE (GLUCOTROL) 5 MG tablet Take 1 tablet (5 mg total) by mouth daily before breakfast. 30 tablet 0   HYDROcodone-acetaminophen (NORCO/VICODIN) 5-325 MG tablet Take 1 tablet by mouth every 6 (six) hours as needed for moderate pain. 30 tablet 0   iron polysaccharides (NIFEREX) 150 MG capsule Take  1 capsule (150 mg total) by mouth daily. 30 capsule 6   metFORMIN (GLUCOPHAGE) 1000 MG tablet Take 1 tablet (1,000 mg total) by mouth 2 (two) times daily with a meal. 60 tablet 0   nicotine (NICODERM CQ - DOSED IN MG/24 HOURS) 14 mg/24hr patch Place 14 mg onto the skin daily.     pantoprazole (PROTONIX) 40 MG tablet Take 1 tablet (40 mg total)  by mouth daily. 30 tablet 0   rosuvastatin (CRESTOR) 10 MG tablet Take 1 tablet (10 mg total) by mouth daily at 6 PM. 30 tablet 0   No current facility-administered medications for this visit.   Allergies:  Pollen extract   Social History: The patient  reports that he has quit smoking. His smoking use included cigarettes. He smoked 1.00 pack per day. He has never used smokeless tobacco. He reports previous alcohol use of about 1.0 - 2.0 standard drink of alcohol per week. He reports that he does not use drugs.   Family History: The patient's family history includes Diabetes in his father.   ROS:  Please see the history of present illness. Otherwise, complete review of systems is positive for {NONE DEFAULTED:18576::"none"}.  All other systems are reviewed and negative.   Physical Exam: VS:  There were no vitals taken for this visit., BMI There is no height or weight on file to calculate BMI.  Wt Readings from Last 3 Encounters:  08/12/19 147 lb (66.7 kg)  06/30/19 155 lb (70.3 kg)  06/06/19 158 lb (71.7 kg)    General: Patient appears comfortable at rest. HEENT: Conjunctiva and lids normal, oropharynx clear with moist mucosa. Neck: Supple, no elevated JVP or carotid bruits, no thyromegaly. Lungs: Clear to auscultation, nonlabored breathing at rest. Cardiac: Regular rate and rhythm, no S3 or significant systolic murmur, no pericardial rub. Abdomen: Soft, nontender, no hepatomegaly, bowel sounds present, no guarding or rebound. Extremities: No pitting edema, distal pulses 2+. Skin: Warm and dry. Musculoskeletal: No kyphosis. Neuropsychiatric: Alert and oriented x3, affect grossly appropriate.  ECG:  An ECG dated 04/05/2019 was personally reviewed today and demonstrated:  Sinus rhythm with LVH.  Recent Labwork: 04/09/2019: Magnesium 2.0 06/30/2019: ALT 25; AST 23 07/01/2019: BUN 10; Creatinine, Ser 0.88; Hemoglobin 9.6; Platelets 206; Potassium 4.1; Sodium 135     Component Value  Date/Time   CHOL 214 11/14/2015 0758   TRIG 214 (A) 11/14/2015 0758   HDL 47 11/14/2015 0758   CHOLHDL 4.6 11/14/2015 0758   LDLCALC 124 11/14/2015 0758  June 2021: Hemoglobin A1c 6.8%, AST 23, ALT 17  Other Studies Reviewed Today:  Echocardiogram 05/24/2019 [facility]): Summary  1. The left ventricle is normal in size with upper normal wall thickness.  2. The left ventricular systolic function is normal, LVEF is visually  estimated at 60-65%.  3. There is grade I diastolic dysfunction (impaired relaxation).  4. The mitral valve leaflets are mildly thickened with normal leaflet  mobility.  5. There is mild mitral valve regurgitation.  6. There is moderate to severe aortic valve stenosis.  7. The right ventricle is normal in size, with normal systolic function.   Assessment and Plan:    Medication Adjustments/Labs and Tests Ordered: Current medicines are reviewed at length with the patient today.  Concerns regarding medicines are outlined above.   Tests Ordered: No orders of the defined types were placed in this encounter.   Medication Changes: No orders of the defined types were placed in this encounter.   Disposition:  Follow  up {follow up:15908}  Signed, Satira Sark, MD, [facility] 11/14/2019 4:51 PM    [place] at [place], [place], [place] [address] Phone: [phone]; Fax: [phone]

## 2019-11-15 ENCOUNTER — Ambulatory Visit: Payer: Medicaid Other | Admitting: Cardiology

## 2019-11-16 ENCOUNTER — Encounter: Payer: Self-pay | Admitting: Cardiology

## 2019-11-29 ENCOUNTER — Other Ambulatory Visit: Payer: Self-pay | Admitting: *Deleted

## 2019-11-29 ENCOUNTER — Other Ambulatory Visit: Payer: Self-pay

## 2019-11-29 NOTE — Patient Outreach (Signed)
Care Coordination - Case Manager  11/29/2019  LAVALLE SKODA 11/04/1955 [id number]  Subjective:  Juan Turner is an 64 y.o. year old male who is a primary patient of Wanita Chamberlain, PA-C.  Mr. Brayman was given information about Medicaid Managed Care team care coordination services today. Merry Proud agreed to services and verbal consent obtained  Review of patient status, laboratory and other test data was performed as part of evaluation for provision of services.   BP Readings from Last 3 Encounters:  08/12/19 (!) 154/90  07/02/19 (!) 147/82  06/06/19 (!) 161/78   Lab Results  Component Value Date   HGBA1C 7.7 (H) 06/30/2019     SDOH: SDOH Screenings   Food Insecurity: No Food Insecurity  . Worried About Charity fundraiser in the Last Year: Never true  . Ran Out of Food in the Last Year: Never true  Housing: Low Risk   . Last Housing Risk Score: 0  Physical Activity: Sufficiently Active  . Days of Exercise per Week: 7 days  . Minutes of Exercise per Session: 30 min  Tobacco Use: Medium Risk  . Smoking Tobacco Use: Former Smoker  . Smokeless Tobacco Use: Never Used  Transportation Needs: No Transportation Needs  . Lack of Transportation (Medical): No  . Lack of Transportation (Non-Medical): No   SDOH Interventions     Most Recent Value  SDOH Interventions  Food Insecurity Interventions Intervention Not Indicated  Housing Interventions Intervention Not Indicated  Physical Activity Interventions Intervention Not Indicated  Transportation Interventions Intervention Not Indicated      Objective:    Allergies  Allergen Reactions  . Pollen Extract     Medications:    Medications Reviewed Today    Reviewed by Waynetta Sandy, MD (Physician) on 08/12/19 at 1457  Med List Status: <None>  Medication Order Taking? Sig Documenting Provider Last Dose Status Informant  amLODipine-benazepril (LOTREL) 5-10 MG per capsule [id number] Yes Take 1  capsule by mouth daily. [provider] Taking Active Spouse/Significant Other  ammonium lactate (AMLACTIN) 12 % cream [id number] Yes Apply topically as needed for dry skin. Apply only to the heel, not to an open wound Trula Slade, DPM Taking Active Spouse/Significant Other  aspirin EC 81 MG EC tablet [id number] Yes Take 1 tablet (81 mg total) by mouth daily. Raiford Noble [place], DO Taking Active Spouse/Significant Other  blood glucose meter kit and supplies KIT [id number] Yes Dispense based on patient and insurance preference. Use up to four times daily as directed. (FOR ICD-9 250.00, 250.01). Raiford Noble Roy, DO Taking Active Spouse/Significant Other  clopidogrel (PLAVIX) 75 MG tablet [id number] Yes Take 1 tablet (75 mg total) by mouth daily at 6 (six) AM. Gabriel Earing, PA-C Taking Active   docusate sodium (COLACE) 100 MG capsule [id number] Yes Take 1 capsule (100 mg total) by mouth daily. Raiford Noble [place], DO Taking Active Spouse/Significant Other  glipiZIDE (GLUCOTROL) 5 MG tablet [id number] Yes Take 1 tablet (5 mg total) by mouth daily before breakfast. Kerney Elbe, DO Taking Active Spouse/Significant Other  HYDROcodone-acetaminophen (NORCO/VICODIN) 5-325 MG tablet [id number] Yes Take 1 tablet by mouth every 6 (six) hours as needed for moderate pain. Gabriel Earing, PA-C Taking Active   iron polysaccharides (NIFEREX) 150 MG capsule [id number] Yes Take 1 capsule (150 mg total) by mouth daily. Waynetta Sandy, MD Taking Active Spouse/Significant Other  metFORMIN (GLUCOPHAGE) 1000 MG tablet [id number]  Take 1 tablet (1,000 mg total) by mouth  2 (two) times daily with a meal. Raiford Noble Emery, DO  Expired 05/27/19 2359 Spouse/Significant Other  nicotine (NICODERM CQ - DOSED IN MG/24 HOURS) 14 mg/24hr patch [id number] Yes Place 14 mg onto the skin daily. [provider] Taking Active Spouse/Significant Other  pantoprazole (PROTONIX) 40 MG tablet [id number]  Yes Take 1 tablet (40 mg total) by mouth daily. Raiford Noble [place], DO Taking Active Spouse/Significant Other  rosuvastatin (CRESTOR) 10 MG tablet [id number] Yes Take 1 tablet (10 mg total) by mouth daily at 6 PM. Kerney Elbe, DO Taking Active Spouse/Significant Other          Assessment:   Goals Addressed              This Visit's Progress   .  "I want to improve my health" (pt-stated)        [place] (see longitudinal plan of care for additional care plan information)  Current Barriers:  . Chronic Disease Management support and education needs related to PAD, CHTN, T2DM and hypercholesterolemia. Patient reports doing "very well". He is checking his blood sugar every morning with fastings around 150 and reports blood pressures within normal range. He is exercising every day.  Nurse Case Manager Clinical Goal(s):  Marland Kitchen Over the next 60 days, patient will attend all scheduled medical appointments: PCP on 01/18/20 . Over the next 60 days, patient will demonstrate improved health management independence as evidenced by monitoring BP and blood sugar at home and reporting abnormal values to provider. . Over the next 60 days, the patient will demonstrate ongoing self health care management ability as evidenced by improving lab values.  Interventions:  . Inter-disciplinary care team collaboration (see longitudinal plan of care) . Evaluation of current treatment plan related to HTN and DM and patient's adherence to plan as established by provider. . Provided education to patient re: hypertension and diabetic diet . Discussed plans with patient for ongoing care management follow up and provided patient with direct contact information for care management team . Advised patient, providing education and rationale, to monitor blood pressure daily and record, calling his provider for findings outside established parameters. . Reviewed scheduled/upcoming provider  appointments including:01/18/20 with PCP  Plan:  . Patient will continue to monitor blood pressure and blood sugar at home, eat a low fat, low salt, diabetic diet, exercise daily and attend upcoming appointments . RNCM will follow up with a telephone visit on 01/30/20 @ 9am.   Initial goal documentation        Plan: RNCM will follow up with a telephone visit on 01/30/20 @ 9 am.  Lurena Joiner RN, [place] RN Care Coordinator

## 2019-11-29 NOTE — Patient Instructions (Signed)
Visit Information  Juan Turner was given information about Medicaid Managed Care team care coordination services as a part of their Healthy [facility] Medicaid benefit. Juan Turner verbally consented to engagement with the [facility] Managed Care team.   For questions related to your Healthy [facility] health plan, please call: [phone] or visit the homepage here: [email or web address]  If you would like to schedule transportation through your Healthy [facility] plan, please call the following number at least 2 days in advance of your appointment: [phone]  Goals Addressed              This Visit's Progress   .  "I want to improve my health" (pt-stated)        CARE PLAN ENTRY Medicaid Managed Care (see longitudinal plan of care for additional care plan information)  Current Barriers:  . Chronic Disease Management support and education needs related to PAD, CHTN, T2DM and hypercholesterolemia. Patient reports doing "very well". He is checking his blood sugar every morning with fastings around 150 and reports blood pressures within normal range. He is exercising every day.  Nurse Case Manager Clinical Goal(s):  Marland Kitchen Over the next 60 days, patient will attend all scheduled medical appointments: PCP on 01/18/20 . Over the next 60 days, patient will demonstrate improved health management independence as evidenced by monitoring BP and blood sugar at home and reporting abnormal values to provider. . Over the next 60 days, the patient will demonstrate ongoing self health care management ability as evidenced by improving lab values.  Interventions:  . Inter-disciplinary care team collaboration (see longitudinal plan of care) . Evaluation of current treatment plan related to HTN and DM and patient's adherence to plan as established by provider. . Provided education to patient re: hypertension and diabetic diet . Discussed plans with patient for ongoing care  management follow up and provided patient with direct contact information for care management team . Advised patient, providing education and rationale, to monitor blood pressure daily and record, calling his provider for findings outside established parameters. . Reviewed scheduled/upcoming provider appointments including:01/18/20 with PCP  Plan:  . Patient will continue to monitor blood pressure and blood sugar at home, eat a low fat, low salt, diabetic diet, exercise daily and attend upcoming appointments . RNCM will follow up with a telephone visit on 01/30/20 @ 9am.   Initial goal documentation        Please see education materials related to Hypertension and Diabetes provided by MyChart link.   Managing Your Hypertension Hypertension is commonly called high blood pressure. This is when the force of your blood pressing against the walls of your arteries is too strong. Arteries are blood vessels that carry blood from your heart throughout your body. Hypertension forces the heart to work harder to pump blood, and may cause the arteries to become narrow or stiff. Having untreated or uncontrolled hypertension can cause heart attack, stroke, kidney disease, and other problems. What are blood pressure readings? A blood pressure reading consists of a higher number over a lower number. Ideally, your blood pressure should be below 120/80. The first ("top") number is called the systolic pressure. It is a measure of the pressure in your arteries as your heart beats. The second ("bottom") number is called the diastolic pressure. It is a measure of the pressure in your arteries as the heart relaxes. What does my blood pressure reading mean? Blood pressure is classified into four stages. Based on your blood pressure reading, your health care  provider may use the following stages to determine what type of treatment you need, if any. Systolic pressure and diastolic pressure are measured in a unit called  mm Hg. Normal  Systolic pressure: below 120.  Diastolic pressure: below 80. Elevated  Systolic pressure: 120-129.  Diastolic pressure: below 80. Hypertension stage 1  Systolic pressure: 130-139.  Diastolic pressure: 80-89. Hypertension stage 2  Systolic pressure: 140 or above.  Diastolic pressure: 90 or above. What health risks are associated with hypertension? Managing your hypertension is an important responsibility. Uncontrolled hypertension can lead to:  A heart attack.  A stroke.  A weakened blood vessel (aneurysm).  Heart failure.  Kidney damage.  Eye damage.  Metabolic syndrome.  Memory and concentration problems. What changes can I make to manage my hypertension? Hypertension can be managed by making lifestyle changes and possibly by taking medicines. Your health care provider will help you make a plan to bring your blood pressure within a normal range. Eating and drinking   Eat a diet that is high in fiber and potassium, and low in salt (sodium), added sugar, and fat. An example eating plan is called the DASH (Dietary Approaches to Stop Hypertension) diet. To eat this way: ? Eat plenty of fresh fruits and vegetables. Try to fill half of your plate at each meal with fruits and vegetables. ? Eat whole grains, such as whole wheat pasta, brown rice, or whole grain bread. Fill about one quarter of your plate with whole grains. ? Eat low-fat diary products. ? Avoid fatty cuts of meat, processed or cured meats, and poultry with skin. Fill about one quarter of your plate with lean proteins such as fish, chicken without skin, beans, eggs, and tofu. ? Avoid premade and processed foods. These tend to be higher in sodium, added sugar, and fat.  Reduce your daily sodium intake. Most people with hypertension should eat less than 1,500 mg of sodium a day.  Limit alcohol intake to no more than 1 drink a day for nonpregnant women and 2 drinks a day for men. One drink  equals 12 oz of beer, 5 oz of wine, or 1 oz of hard liquor. Lifestyle  Work with your health care provider to maintain a healthy body weight, or to lose weight. Ask what an ideal weight is for you.  Get at least 30 minutes of exercise that causes your heart to beat faster (aerobic exercise) most days of the week. Activities may include walking, swimming, or biking.  Include exercise to strengthen your muscles (resistance exercise), such as weight lifting, as part of your weekly exercise routine. Try to do these types of exercises for 30 minutes at least 3 days a week.  Do not use any products that contain nicotine or tobacco, such as cigarettes and e-cigarettes. If you need help quitting, ask your health care provider.  Control any long-term (chronic) conditions you have, such as high cholesterol or diabetes. Monitoring  Monitor your blood pressure at home as told by your health care provider. Your personal target blood pressure may vary depending on your medical conditions, your age, and other factors.  Have your blood pressure checked regularly, as often as told by your health care provider. Working with your health care provider  Review all the medicines you take with your health care provider because there may be side effects or interactions.  Talk with your health care provider about your diet, exercise habits, and other lifestyle factors that may be contributing to hypertension.  Visit your health care provider regularly. Your health care provider can help you create and adjust your plan for managing hypertension. Will I need medicine to control my blood pressure? Your health care provider may prescribe medicine if lifestyle changes are not enough to get your blood pressure under control, and if:  Your systolic blood pressure is 130 or higher.  Your diastolic blood pressure is 80 or higher. Take medicines only as told by your health care provider. Follow the directions carefully.  Blood pressure medicines must be taken as prescribed. The medicine does not work as well when you skip doses. Skipping doses also puts you at risk for problems. Contact a health care provider if:  You think you are having a reaction to medicines you have taken.  You have repeated (recurrent) headaches.  You feel dizzy.  You have swelling in your ankles.  You have trouble with your vision. Get help right away if:  You develop a severe headache or confusion.  You have unusual weakness or numbness, or you feel faint.  You have severe pain in your chest or abdomen.  You vomit repeatedly.  You have trouble breathing. Summary  Hypertension is when the force of blood pumping through your arteries is too strong. If this condition is not controlled, it may put you at risk for serious complications.  Your personal target blood pressure may vary depending on your medical conditions, your age, and other factors. For most people, a normal blood pressure is less than 120/80.  Hypertension is managed by lifestyle changes, medicines, or both. Lifestyle changes include weight loss, eating a healthy, low-sodium diet, exercising more, and limiting alcohol. This information is not intended to replace advice given to you by your health care provider. Make sure you discuss any questions you have with your health care provider. Document Revised: 05/21/2018 Document Reviewed: 12/26/2015 Elsevier Patient Education  2020 [employer]    Diabetes Mellitus and Nutrition, Adult When you have diabetes (diabetes mellitus), it is very important to have healthy eating habits because your blood sugar (glucose) levels are greatly affected by what you eat and drink. Eating healthy foods in the appropriate amounts, at about the same times every day, can help you:  Control your blood glucose.  Lower your risk of heart disease.  Improve your blood pressure.  Reach or maintain a healthy weight. Every person  with diabetes is different, and each person has different needs for a meal plan. Your health care provider may recommend that you work with a diet and nutrition specialist (dietitian) to make a meal plan that is best for you. Your meal plan may vary depending on factors such as:  The calories you need.  The medicines you take.  Your weight.  Your blood glucose, blood pressure, and cholesterol levels.  Your activity level.  Other health conditions you have, such as heart or kidney disease. How do carbohydrates affect me? Carbohydrates, also called carbs, affect your blood glucose level more than any other type of food. Eating carbs naturally raises the amount of glucose in your blood. Carb counting is a method for keeping track of how many carbs you eat. Counting carbs is important to keep your blood glucose at a healthy level, especially if you use insulin or take certain oral diabetes medicines. It is important to know how many carbs you can safely have in each meal. This is different for every person. Your dietitian can help you calculate how many carbs you should have at  each meal and for each snack. Foods that contain carbs include:  Bread, cereal, rice, pasta, and crackers.  Potatoes and corn.  Peas, beans, and lentils.  Milk and yogurt.  Fruit and juice.  Desserts, such as cakes, cookies, ice cream, and candy. How does alcohol affect me? Alcohol can cause a sudden decrease in blood glucose (hypoglycemia), especially if you use insulin or take certain oral diabetes medicines. Hypoglycemia can be a life-threatening condition. Symptoms of hypoglycemia (sleepiness, dizziness, and confusion) are similar to symptoms of having too much alcohol. If your health care provider says that alcohol is safe for you, follow these guidelines:  Limit alcohol intake to no more than 1 drink per day for nonpregnant women and 2 drinks per day for men. One drink equals 12 oz of beer, 5 oz of wine, or  1 oz of hard liquor.  Do not drink on an empty stomach.  Keep yourself hydrated with water, diet soda, or unsweetened iced tea.  Keep in mind that regular soda, juice, and other mixers may contain a lot of sugar and must be counted as carbs. What are tips for following this plan?  Reading food labels  Start by checking the serving size on the "Nutrition Facts" label of packaged foods and drinks. The amount of calories, carbs, fats, and other nutrients listed on the label is based on one serving of the item. Many items contain more than one serving per package.  Check the total grams (g) of carbs in one serving. You can calculate the number of servings of carbs in one serving by dividing the total carbs by 15. For example, if a food has 30 g of total carbs, it would be equal to 2 servings of carbs.  Check the number of grams (g) of saturated and trans fats in one serving. Choose foods that have low or no amount of these fats.  Check the number of milligrams (mg) of salt (sodium) in one serving. Most people should limit total sodium intake to less than 2,300 mg per day.  Always check the nutrition information of foods labeled as "low-fat" or "nonfat". These foods may be higher in added sugar or refined carbs and should be avoided.  Talk to your dietitian to identify your daily goals for nutrients listed on the label. Shopping  Avoid buying canned, premade, or processed foods. These foods tend to be high in fat, sodium, and added sugar.  Shop around the outside edge of the grocery store. This includes fresh fruits and vegetables, bulk grains, fresh meats, and fresh dairy. Cooking  Use low-heat cooking methods, such as baking, instead of high-heat cooking methods like deep frying.  Cook using healthy oils, such as olive, canola, or sunflower oil.  Avoid cooking with butter, cream, or high-fat meats. Meal planning  Eat meals and snacks regularly, preferably at the same times every  day. Avoid going long periods of time without eating.  Eat foods high in fiber, such as fresh fruits, vegetables, beans, and whole grains. Talk to your dietitian about how many servings of carbs you can eat at each meal.  Eat 4-6 ounces (oz) of lean protein each day, such as lean meat, chicken, fish, eggs, or tofu. One oz of lean protein is equal to: ? 1 oz of meat, chicken, or fish. ? 1 egg. ?  cup of tofu.  Eat some foods each day that contain healthy fats, such as avocado, nuts, seeds, and fish. Lifestyle  Check your blood glucose regularly.  Exercise regularly as told by your health care provider. This may include: ? 150 minutes of moderate-intensity or vigorous-intensity exercise each week. This could be brisk walking, biking, or water aerobics. ? Stretching and doing strength exercises, such as yoga or weightlifting, at least 2 times a week.  Take medicines as told by your health care provider.  Do not use any products that contain nicotine or tobacco, such as cigarettes and e-cigarettes. If you need help quitting, ask your health care provider.  Work with a Veterinary surgeon or diabetes educator to identify strategies to manage stress and any emotional and social challenges. Questions to ask a health care provider  Do I need to meet with a diabetes educator?  Do I need to meet with a dietitian?  What number can I call if I have questions?  When are the best times to check my blood glucose? Where to find more information:  American Diabetes Association: diabetes.org  Academy of Nutrition and Dietetics: www.eatright.[employer] of Diabetes and Digestive and Kidney Diseases (NIH): CarFlippers.tn Summary  A healthy meal plan will help you control your blood glucose and maintain a healthy lifestyle.  Working with a diet and nutrition specialist (dietitian) can help you make a meal plan that is best for you.  Keep in mind that carbohydrates (carbs) and alcohol  have immediate effects on your blood glucose levels. It is important to count carbs and to use alcohol carefully. This information is not intended to replace advice given to you by your health care provider. Make sure you discuss any questions you have with your health care provider. Document Revised: 01/09/2017 Document Reviewed: 03/03/2016 Elsevier Patient Education  2020 [employer].   Patient verbalizes understanding of instructions provided today.   The patient has been provided with contact information for the Managed Medicaid care management team and has been advised to call with any health related questions or concerns.  Telephone follow up appointment with Managed Medicaid care management team member scheduled for:01/30/20 @ 9am  Estanislado Emms RN, BSN [place]  [employer]

## 2019-12-17 ENCOUNTER — Other Ambulatory Visit: Payer: Self-pay | Admitting: Vascular Surgery

## 2019-12-17 DIAGNOSIS — D509 Iron deficiency anemia, unspecified: Secondary | ICD-10-CM

## 2020-01-18 ENCOUNTER — Ambulatory Visit: Payer: Medicare Other | Admitting: Cardiology

## 2020-01-18 NOTE — Progress Notes (Unsigned)
Cardiology Office Note  Date: 01/18/2020   ID: Juan Turner, DOB February 01, 1956, MRN [id number]  PCP:  Wanita Chamberlain, PA-C  Cardiologist:  Rozann Lesches, MD Electrophysiologist:  None   No chief complaint on file.   History of Present Illness: Juan Turner is a 64 y.o. male referred for cardiology consultation by Ms. Boles PA-C with [facility] for evaluation of aortic stenosis.  He has no-showed for the two previously scheduled visits.  Echocardiogram done at [facility] back in April of this year indicated moderate to severe aortic stenosis, mean gradient 15 mmHg with dimensionless index 0.28-0.30.  Past Medical History:  Diagnosis Date  . Anemia   . Anxiety   . Aortic stenosis   . Essential hypertension   . GERD (gastroesophageal reflux disease)   . Mixed hyperlipidemia   . PAD (peripheral artery disease) ([place])   . Type 2 diabetes mellitus ([place])     Past Surgical History:  Procedure Laterality Date  . ABDOMINAL AORTOGRAM W/LOWER EXTREMITY N/A 04/05/2019   Procedure: ABDOMINAL AORTOGRAM W/LOWER EXTREMITY;  Surgeon: Elam Dutch, MD;  Location: [facility];  Service: Cardiovascular;  Laterality: N/A;  . ABDOMINAL AORTOGRAM W/LOWER EXTREMITY Bilateral 06/06/2019   Procedure: ABDOMINAL AORTOGRAM W/LOWER EXTREMITY;  Surgeon: Waynetta Sandy, MD;  Location: [facility];  Service: Cardiovascular;  Laterality: Bilateral;  . AMPUTATION Right 04/07/2019   Procedure: AMPUTATION DIGIT RIGHT FIRST AND SECOND TOES;  Surgeon: Waynetta Sandy, MD;  Location: [facility];  Service: Vascular;  Laterality: Right;  . FEMORAL-TIBIAL BYPASS GRAFT Right 04/07/2019   Procedure: LEFT FEMORAL POSTERIOR TIBIAL BYPASS;  Surgeon: Waynetta Sandy, MD;  Location: [facility];  Service: Vascular;  Laterality: Right;  . FEMORAL-TIBIAL BYPASS GRAFT Left 06/30/2019   Procedure: LEFT BYPASS GRAFT SUPERFICIAL FEMORAL-Posterior TIBIAL ARTERY using nonreversed saphenous  vein;  Surgeon: Waynetta Sandy, MD;  Location: [facility];  Service: Vascular;  Laterality: Left;  . HERNIA REPAIR    . TRANSMETATARSAL AMPUTATION Right 04/28/2019   Right transmetatarsal amputation  . TRANSMETATARSAL AMPUTATION Right 04/28/2019   Procedure: TRANSMETATARSAL AMPUTATION;  Surgeon: Waynetta Sandy, MD;  Location: [facility];  Service: Vascular;  Laterality: Right;    Current Outpatient Medications  Medication Sig Dispense Refill  . amLODipine-benazepril (LOTREL) 5-10 MG per capsule Take 1 capsule by mouth daily.    Marland Kitchen ammonium lactate (AMLACTIN) 12 % cream Apply topically as needed for dry skin. Apply only to the heel, not to an open wound 385 g 0  . aspirin 325 MG tablet Take 325 mg by mouth daily.    . blood glucose meter kit and supplies KIT Dispense based on patient and insurance preference. Use up to four times daily as directed. (FOR ICD-9 250.00, 250.01). 1 each 0  . clopidogrel (PLAVIX) 75 MG tablet TAKE 1 TABLET(75 MG) BY MOUTH DAILY AT 6 AM 30 tablet 2  . docusate sodium (COLACE) 100 MG capsule Take 1 capsule (100 mg total) by mouth daily. 10 capsule 0  . glipiZIDE (GLUCOTROL) 5 MG tablet Take 1 tablet (5 mg total) by mouth daily before breakfast. 30 tablet 0  . HYDROcodone-acetaminophen (NORCO/VICODIN) 5-325 MG tablet Take 1 tablet by mouth every 6 (six) hours as needed for moderate pain. 30 tablet 0  . iron polysaccharides (NIFEREX) 150 MG capsule TAKE 1 CAPSULE(150 MG) BY MOUTH DAILY 30 capsule 6  . metFORMIN (GLUCOPHAGE) 1000 MG tablet Take 1 tablet (1,000 mg total) by mouth 2 (two) times  daily with a meal. 60 tablet 0  . nicotine (NICODERM CQ - DOSED IN MG/24 HOURS) 14 mg/24hr patch Place 14 mg onto the skin daily.    . pantoprazole (PROTONIX) 40 MG tablet Take 1 tablet (40 mg total) by mouth daily. 30 tablet 0  . rosuvastatin (CRESTOR) 10 MG tablet Take 1 tablet (10 mg total) by mouth daily at 6 PM. 30 tablet 0   No current facility-administered  medications for this visit.   Allergies:  Pollen extract   Social History: The patient  reports that he has quit smoking. His smoking use included cigarettes. He smoked 1.00 pack per day. He has never used smokeless tobacco. He reports previous alcohol use of about 1.0 - 2.0 standard drink of alcohol per week. He reports that he does not use drugs.   Family History: The patient's family history includes Diabetes in his father.   ROS:  Please see the history of present illness. Otherwise, complete review of systems is positive for {NONE DEFAULTED:18576::"none"}.  All other systems are reviewed and negative.   Physical Exam: VS:  There were no vitals taken for this visit., BMI There is no height or weight on file to calculate BMI.  Wt Readings from Last 3 Encounters:  08/12/19 147 lb (66.7 kg)  06/30/19 155 lb (70.3 kg)  06/06/19 158 lb (71.7 kg)    General: Patient appears comfortable at rest. HEENT: Conjunctiva and lids normal, oropharynx clear with moist mucosa. Neck: Supple, no elevated JVP or carotid bruits, no thyromegaly. Lungs: Clear to auscultation, nonlabored breathing at rest. Cardiac: Regular rate and rhythm, no S3 or significant systolic murmur, no pericardial rub. Abdomen: Soft, nontender, no hepatomegaly, bowel sounds present, no guarding or rebound. Extremities: No pitting edema, distal pulses 2+. Skin: Warm and dry. Musculoskeletal: No kyphosis. Neuropsychiatric: Alert and oriented x3, affect grossly appropriate.  ECG:  An ECG dated 04/04/2019 was personally reviewed today and demonstrated:  Sinus rhythm with LVH.  Recent Labwork: 04/09/2019: Magnesium 2.0 06/30/2019: ALT 25; AST 23 07/01/2019: BUN 10; Creatinine, Ser 0.88; Hemoglobin 9.6; Platelets 206; Potassium 4.1; Sodium 135     Component Value Date/Time   CHOL 214 11/14/2015 0758   TRIG 214 (A) 11/14/2015 0758   HDL 47 11/14/2015 0758   CHOLHDL 4.6 11/14/2015 0758   LDLCALC 124 11/14/2015 0758    Other  Studies Reviewed Today:  Echocardiogram 05/24/2019 [facility]): Summary  1. The left ventricle is normal in size with upper normal wall thickness.  2. The left ventricular systolic function is normal, LVEF is visually  estimated at 60-65%.  3. There is grade I diastolic dysfunction (impaired relaxation).  4. The mitral valve leaflets are mildly thickened with normal leaflet  mobility.  5. There is mild mitral valve regurgitation.  6. There is moderate to severe aortic valve stenosis.  7. The right ventricle is normal in size, with normal systolic function.   Assessment and Plan:    Medication Adjustments/Labs and Tests Ordered: Current medicines are reviewed at length with the patient today.  Concerns regarding medicines are outlined above.   Tests Ordered: No orders of the defined types were placed in this encounter.   Medication Changes: No orders of the defined types were placed in this encounter.   Disposition:  Follow up {follow up:15908}  Signed, Satira Sark, MD, [facility] 01/18/2020 10:03 AM    [facility] [place], [place], [place] [address] Phone: [phone]; Fax: [phone]

## 2020-01-27 ENCOUNTER — Encounter: Payer: Self-pay | Admitting: Cardiology

## 2020-01-27 ENCOUNTER — Ambulatory Visit (INDEPENDENT_AMBULATORY_CARE_PROVIDER_SITE_OTHER): Payer: Medicare Other | Admitting: Cardiology

## 2020-01-27 VITALS — BP 158/92 | HR 66 | Ht 72.0 in | Wt 157.6 lb

## 2020-01-27 DIAGNOSIS — I350 Nonrheumatic aortic (valve) stenosis: Secondary | ICD-10-CM | POA: Diagnosis not present

## 2020-01-27 DIAGNOSIS — I739 Peripheral vascular disease, unspecified: Secondary | ICD-10-CM

## 2020-01-27 NOTE — Addendum Note (Signed)
Addended by: Eustace Moore on: 01/27/2020 03:43 PM   Modules accepted: Orders

## 2020-01-27 NOTE — Progress Notes (Signed)
Cardiology Office Note  Date: 01/27/2020   ID: Merry Proud, DOB 1955/06/20, MRN [id number]  PCP:  Wanita Chamberlain, PA-C  Cardiologist:  Rozann Lesches, MD Electrophysiologist:  None   Chief Complaint  Patient presents with   Aortic Stenosis    History of Present Illness: Juan Turner is a 64 y.o. male referred for cardiology consultation by Ms. Boles PA-C with [facility] for evaluation of aortic stenosis.  He reports no known history of valvular heart disease or previously being told about heart murmur.  He is married, he tells me that his wife works during the day and he takes care of the house and outside chores.  He reports NYHA class II dyspnea with typical activities, no exertional chest pain or syncope. He follows with Dr. Donzetta Matters with [facility], history of bilateral SFA to posterior tibial bypasses.  At this point he tells me that his claudication is well controlled.  Echocardiogram done at [facility] back in April of this year indicated moderate to severe aortic stenosis, mean gradient 15 mmHg with dimensionless index 0.28-0.30.  I discussed aortic stenosis with him today and also natural course and general management strategies.  I reviewed his medications which are outlined below.  I personally reviewed his ECG today which shows sinus rhythm with borderline prolonged PR interval and LVH.  Past Medical History:  Diagnosis Date   Anemia    Anxiety    Aortic stenosis    Essential hypertension    GERD (gastroesophageal reflux disease)    Mixed hyperlipidemia    PAD (peripheral artery disease) (HCC)    Type 2 diabetes mellitus ([place])     Past Surgical History:  Procedure Laterality Date   ABDOMINAL AORTOGRAM W/LOWER EXTREMITY N/A 04/05/2019   Procedure: ABDOMINAL AORTOGRAM W/LOWER EXTREMITY;  Surgeon: Elam Dutch, MD;  Location: [place] [facility];  Service: Cardiovascular;  Laterality: N/A;   ABDOMINAL AORTOGRAM W/LOWER EXTREMITY Bilateral 06/06/2019    Procedure: ABDOMINAL AORTOGRAM W/LOWER EXTREMITY;  Surgeon: Waynetta Sandy, MD;  Location: [place] [facility];  Service: Cardiovascular;  Laterality: Bilateral;   AMPUTATION Right 04/07/2019   Procedure: AMPUTATION DIGIT RIGHT FIRST AND SECOND TOES;  Surgeon: Waynetta Sandy, MD;  Location: [place];  Service: Vascular;  Laterality: Right;   FEMORAL-TIBIAL BYPASS GRAFT Right 04/07/2019   Procedure: LEFT FEMORAL POSTERIOR TIBIAL BYPASS;  Surgeon: Waynetta Sandy, MD;  Location: [place];  Service: Vascular;  Laterality: Right;   FEMORAL-TIBIAL BYPASS GRAFT Left 06/30/2019   Procedure: LEFT BYPASS GRAFT SUPERFICIAL FEMORAL-Posterior TIBIAL ARTERY using nonreversed saphenous vein;  Surgeon: Waynetta Sandy, MD;  Location: [place];  Service: Vascular;  Laterality: Left;   HERNIA REPAIR     TRANSMETATARSAL AMPUTATION Right 04/28/2019   Right transmetatarsal amputation   TRANSMETATARSAL AMPUTATION Right 04/28/2019   Procedure: TRANSMETATARSAL AMPUTATION;  Surgeon: Waynetta Sandy, MD;  Location: [place];  Service: Vascular;  Laterality: Right;    Current Outpatient Medications  Medication Sig Dispense Refill   amLODipine-benazepril (LOTREL) 5-10 MG per capsule Take 1 capsule by mouth daily.     ammonium lactate (AMLACTIN) 12 % cream Apply topically as needed for dry skin. Apply only to the heel, not to an open wound 385 g 0   aspirin 325 MG tablet Take 325 mg by mouth daily.     blood glucose meter kit and supplies KIT Dispense based on patient and insurance preference. Use up to four times daily as directed. (FOR ICD-9 250.00, 250.01).  1 each 0   clopidogrel (PLAVIX) 75 MG tablet TAKE 1 TABLET(75 MG) BY MOUTH DAILY AT 6 AM 30 tablet 2   docusate sodium (COLACE) 100 MG capsule Take 1 capsule (100 mg total) by mouth daily. 10 capsule 0   glipiZIDE (GLUCOTROL) 5 MG tablet Take 1 tablet (5 mg total) by mouth daily before breakfast. 30 tablet 0    HYDROcodone-acetaminophen (NORCO/VICODIN) 5-325 MG tablet Take 1 tablet by mouth every 6 (six) hours as needed for moderate pain. 30 tablet 0   iron polysaccharides (NIFEREX) 150 MG capsule TAKE 1 CAPSULE(150 MG) BY MOUTH DAILY 30 capsule 6   metFORMIN (GLUCOPHAGE) 1000 MG tablet Take 1 tablet (1,000 mg total) by mouth 2 (two) times daily with a meal. 60 tablet 0   nicotine (NICODERM CQ - DOSED IN MG/24 HOURS) 14 mg/24hr patch Place 14 mg onto the skin daily.     pantoprazole (PROTONIX) 40 MG tablet Take 1 tablet (40 mg total) by mouth daily. 30 tablet 0   rosuvastatin (CRESTOR) 10 MG tablet Take 1 tablet (10 mg total) by mouth daily at 6 PM. 30 tablet 0   No current facility-administered medications for this visit.   Allergies:  Pollen extract   Social History: The patient  reports that he has quit smoking. His smoking use included cigarettes. He smoked 1.00 pack per day. He has never used smokeless tobacco. He reports previous alcohol use of about 1.0 - 2.0 standard drink of alcohol per week. He reports that he does not use drugs.   Family History: The patient's family history includes Diabetes in his father.   ROS: No palpitations or frank syncope.  Physical Exam: VS:  BP (!) 158/92    Pulse 66    Ht 6' (1.829 m)    Wt 157 lb 9.6 oz (71.5 kg)    SpO2 98%    BMI 21.37 kg/m , BMI Body mass index is 21.37 kg/m.  Wt Readings from Last 3 Encounters:  01/27/20 157 lb 9.6 oz (71.5 kg)  08/12/19 147 lb (66.7 kg)  06/30/19 155 lb (70.3 kg)    General: Patient appears comfortable at rest. HEENT: Conjunctiva and lids normal, wearing a mask. Neck: Supple, no elevated JVP, possible right carotid bruit versus radiation of murmur. Lungs: Clear to auscultation, nonlabored breathing at rest. Cardiac: Regular rate and rhythm, no S3, 3/6 systolic murmur consistent with aortic stenosis, no pericardial rub. Abdomen: Soft, nontender, bowel sounds present. Extremities: No pitting edema.  Status  post amputation right first and second toes. Skin: Warm and dry. Musculoskeletal: No kyphosis. Neuropsychiatric: Alert and oriented x3, affect grossly appropriate.  ECG:  An ECG dated 04/04/2019 was personally reviewed today and demonstrated:  Sinus rhythm with LVH.  Recent Labwork: 04/09/2019: Magnesium 2.0 06/30/2019: ALT 25; AST 23 07/01/2019: BUN 10; Creatinine, Ser 0.88; Hemoglobin 9.6; Platelets 206; Potassium 4.1; Sodium 135   Other Studies Reviewed Today:  Echocardiogram 05/24/2019 [facility]): Summary  1. The left ventricle is normal in size with upper normal wall thickness.  2. The left ventricular systolic function is normal, LVEF is visually  estimated at 60-65%.  3. There is grade I diastolic dysfunction (impaired relaxation).  4. The mitral valve leaflets are mildly thickened with normal leaflet  mobility.  5. There is mild mitral valve regurgitation.  6. There is moderate to severe aortic valve stenosis.  7. The right ventricle is normal in size, with normal systolic function.   Assessment and Plan:  1.  Moderate  to severe calcific aortic stenosis as noted above.  We discussed natural course and management strategies including open valve replacement and TAVR at some point.  He reports reasonable functional capacity at that time and we will continue with observation.  Plan is for repeat echocardiogram in 6 months with clinical reevaluation.  2.  PAD status post bilateral SFA to posterior tibial bypasses, also amputation right first and second toes.  Reports good control of claudication at this time and continues to follow with Dr. Donzetta Matters.  Plavix, and Crestor.  Medication Adjustments/Labs and Tests Ordered: Current medicines are reviewed at length with the patient today.  Concerns regarding medicines are outlined above.   Tests Ordered: Orders Placed This Encounter  Procedures   ECHOCARDIOGRAM COMPLETE    Medication Changes: No orders of the  defined types were placed in this encounter.   Disposition:  Follow up 6 months in the [place] office.  Signed, Satira Sark, MD, [facility] 01/27/2020 1:40 PM    [facility] [place], [place], [place] [address] Phone: [phone]; Fax: [phone]

## 2020-01-27 NOTE — Patient Instructions (Addendum)
Medication Instructions:    Your physician recommends that you continue on your current medications as directed. Please refer to the Current Medication list given to you today.  Labwork:  None  Testing/Procedures: Your physician has requested that you have an echocardiogram in 6 months just before your next visit. Echocardiography is a painless test that uses sound waves to create images of your heart. It provides your doctor with information about the size and shape of your heart and how well your heart's chambers and valves are working. This procedure takes approximately one hour. There are no restrictions for this procedure.  Follow-Up:  Your physician recommends that you schedule a follow-up appointment in: 6 months.  Any Other Special Instructions Will Be Listed Below (If Applicable).  If you need a refill on your cardiac medications before your next appointment, please call your pharmacy. 

## 2020-01-30 ENCOUNTER — Other Ambulatory Visit: Payer: Self-pay

## 2020-01-30 ENCOUNTER — Other Ambulatory Visit: Payer: Self-pay | Admitting: *Deleted

## 2020-01-30 NOTE — Patient Outreach (Signed)
Care Coordination  01/30/2020  [name] 03-12-1955 [id number]   Successful outreach today with [name], however he was driving and requested to be called at another time. RNCM rescheduled appointment for 01/31/20 @ 1:30pm. [name] agreed to this time/date.  Estanislado Emms RN, BSN [place]   [facility]

## 2020-01-31 ENCOUNTER — Other Ambulatory Visit: Payer: Self-pay | Admitting: *Deleted

## 2020-01-31 NOTE — Patient Instructions (Signed)
Visit Information  Mr. MANDELA BELLO  - as a part of your Medicaid benefit, you are eligible for care management and care coordination services at no cost or copay. I was unable to reach you by phone today but would be happy to help you with your health related needs. Please feel free to call me @ [phone].   A member of the Managed Medicaid care management team will reach out to you again over the next 7-14 days.   Estanislado Emms RN, BSN [place]  [facility] [employer]

## 2020-01-31 NOTE — Patient Outreach (Signed)
Care Coordination  01/31/2020  Juan Turner 16-Oct-1955 [id number]    Medicaid Managed Care   Unsuccessful Outreach Note  01/31/2020 Name: Juan Turner MRN: [id number] DOB: 08/24/1955  Referred by: Dion Saucier, PA-C Reason for referral : High Risk Managed Medicaid (Unsuccessful follow up outreach)   An unsuccessful telephone outreach was attempted today. The patient was referred to the case management team for assistance with care management and care coordination.   Follow Up Plan: RNCM will follow up again over the next 7-14 days with a telephone call.  Estanislado Emms RN, BSN [place]  [facility]

## 2020-02-09 ENCOUNTER — Telehealth: Payer: Self-pay | Admitting: Physician Assistant

## 2020-02-09 NOTE — Telephone Encounter (Signed)
I attempted to reach Juan Turner to reschedule his phone appt with the Managed Medicaid RNCM. I left my name and number for him to return my call, I will reach out again in the next 7-14 days if I do not hear back from him.

## 2020-02-19 IMAGING — DX DG TOE 2ND 2+V*L*
3 series · 3 of 3 positions shown · non-contrast
Comparison: None.

CLINICAL DATA: Pain for a month.

EXAM:
LEFT SECOND TOE

[toe ap]
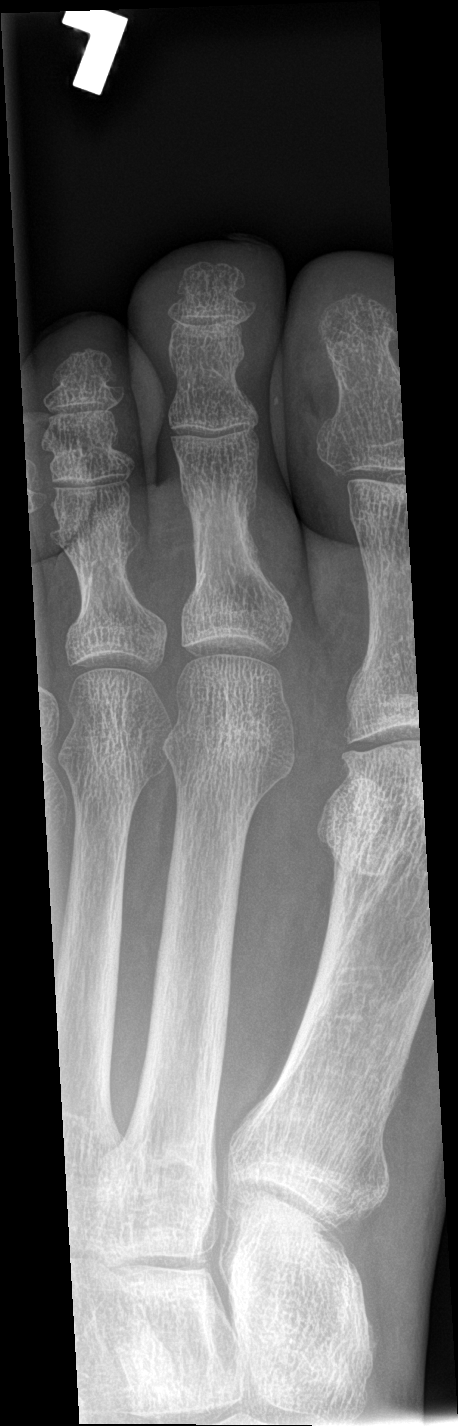

[toe obl]
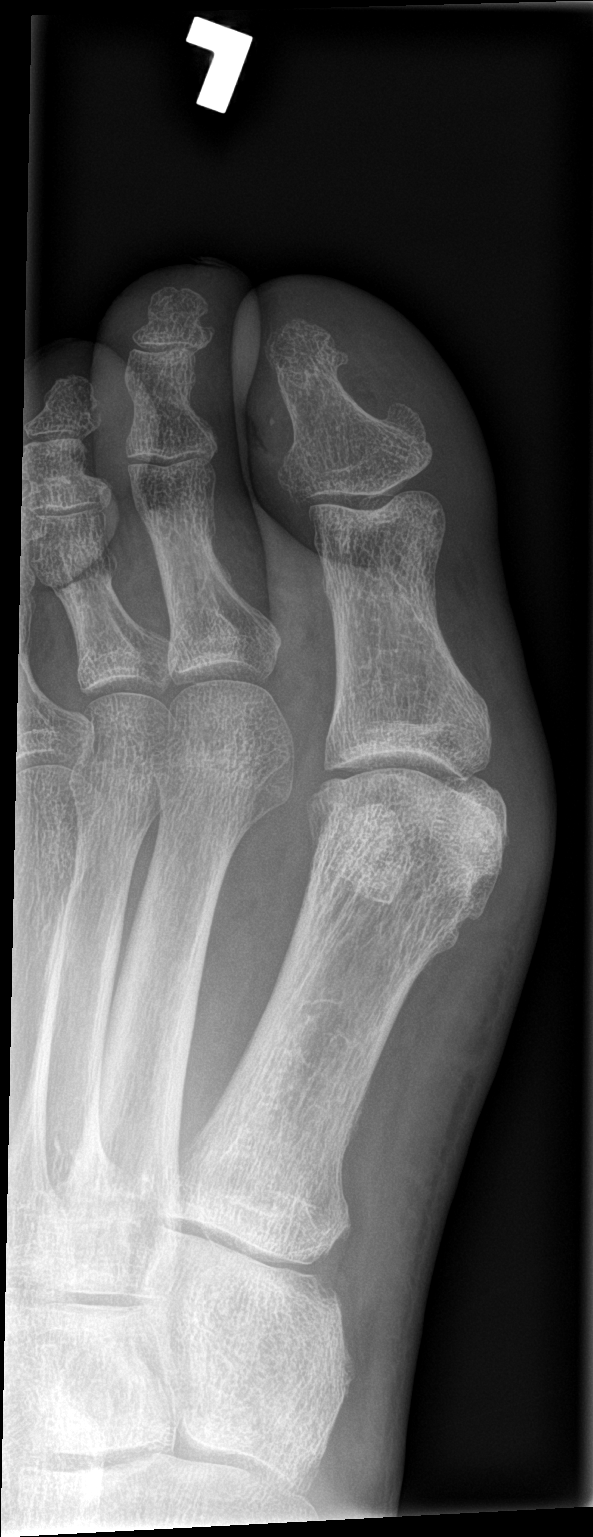

[toe lat]
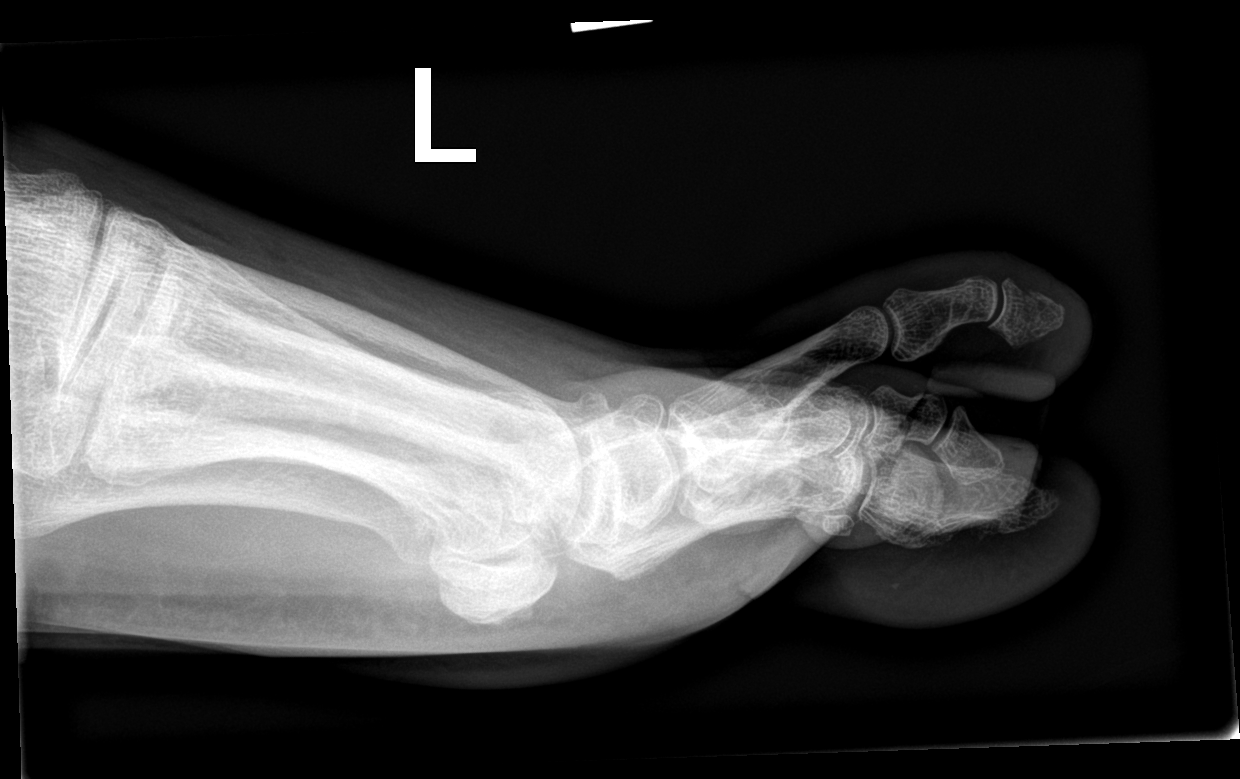

[3 of 3 positions shown; findings below may reference images not displayed]

FINDINGS: There is no evidence of fracture or dislocation. There is no
evidence of arthropathy or other focal bone abnormality. Soft
tissues are unremarkable.
IMPRESSION: Negative.

## 2020-03-20 ENCOUNTER — Ambulatory Visit: Payer: Medicare Other | Admitting: Cardiology

## 2020-05-02 ENCOUNTER — Telehealth: Payer: Self-pay | Admitting: Physician Assistant

## 2020-05-02 NOTE — Telephone Encounter (Signed)
Attempted to reach Juan Turner today to see if I could get him rescheduled with the Managed Medicaid team for a phone visit. I left my contact info on  His VM.I will reach out again in 7-14 days if I have not heard back from him.

## 2020-05-16 IMAGING — DX DG FOOT COMPLETE 3+V*R*
3 series · 3 of 3 positions shown · non-contrast
Comparison: None.

CLINICAL DATA: Right foot pain with great toe ulcer.

EXAM:
RIGHT FOOT COMPLETE - 3+ VIEW

[foot ap]
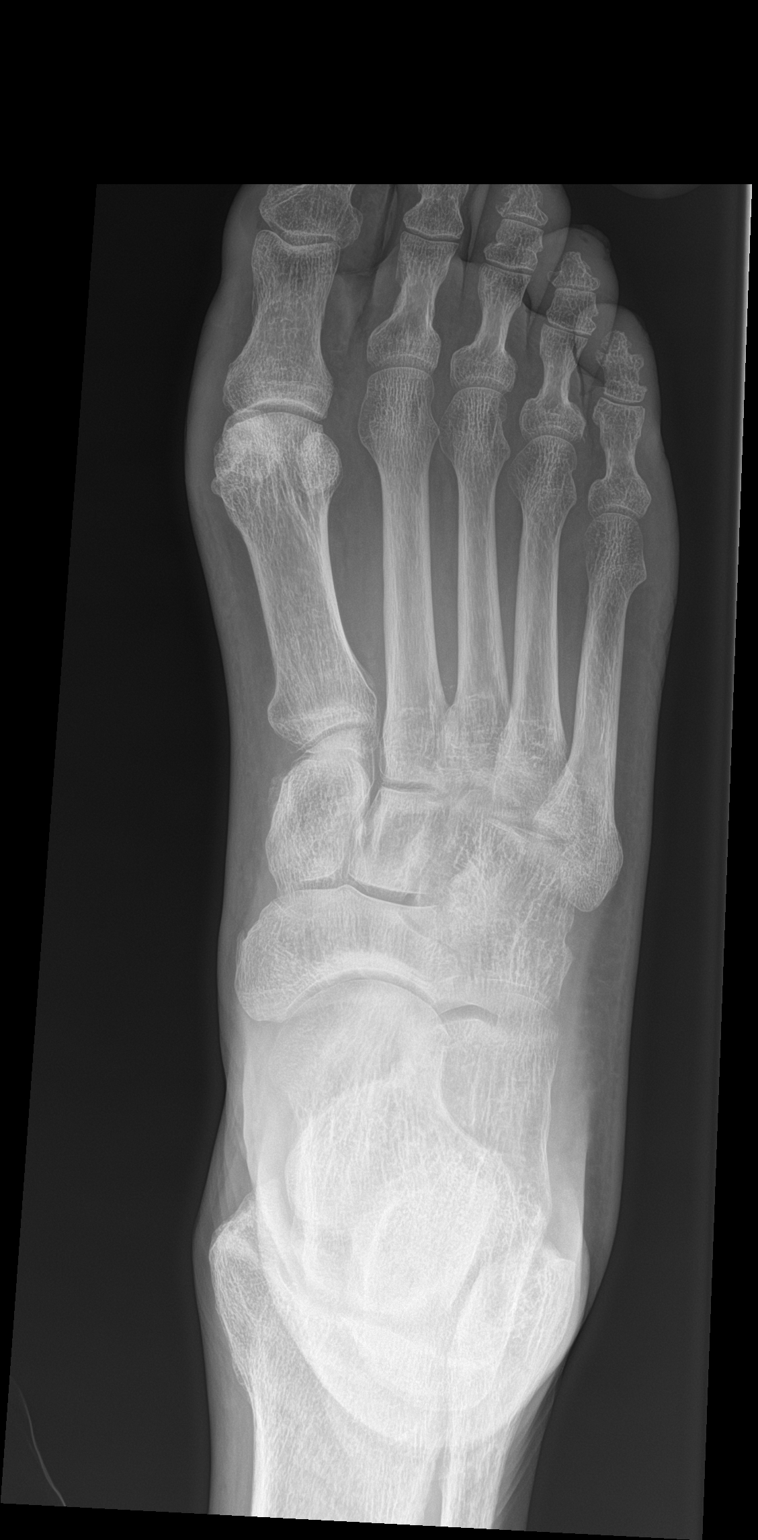

[foot obl]
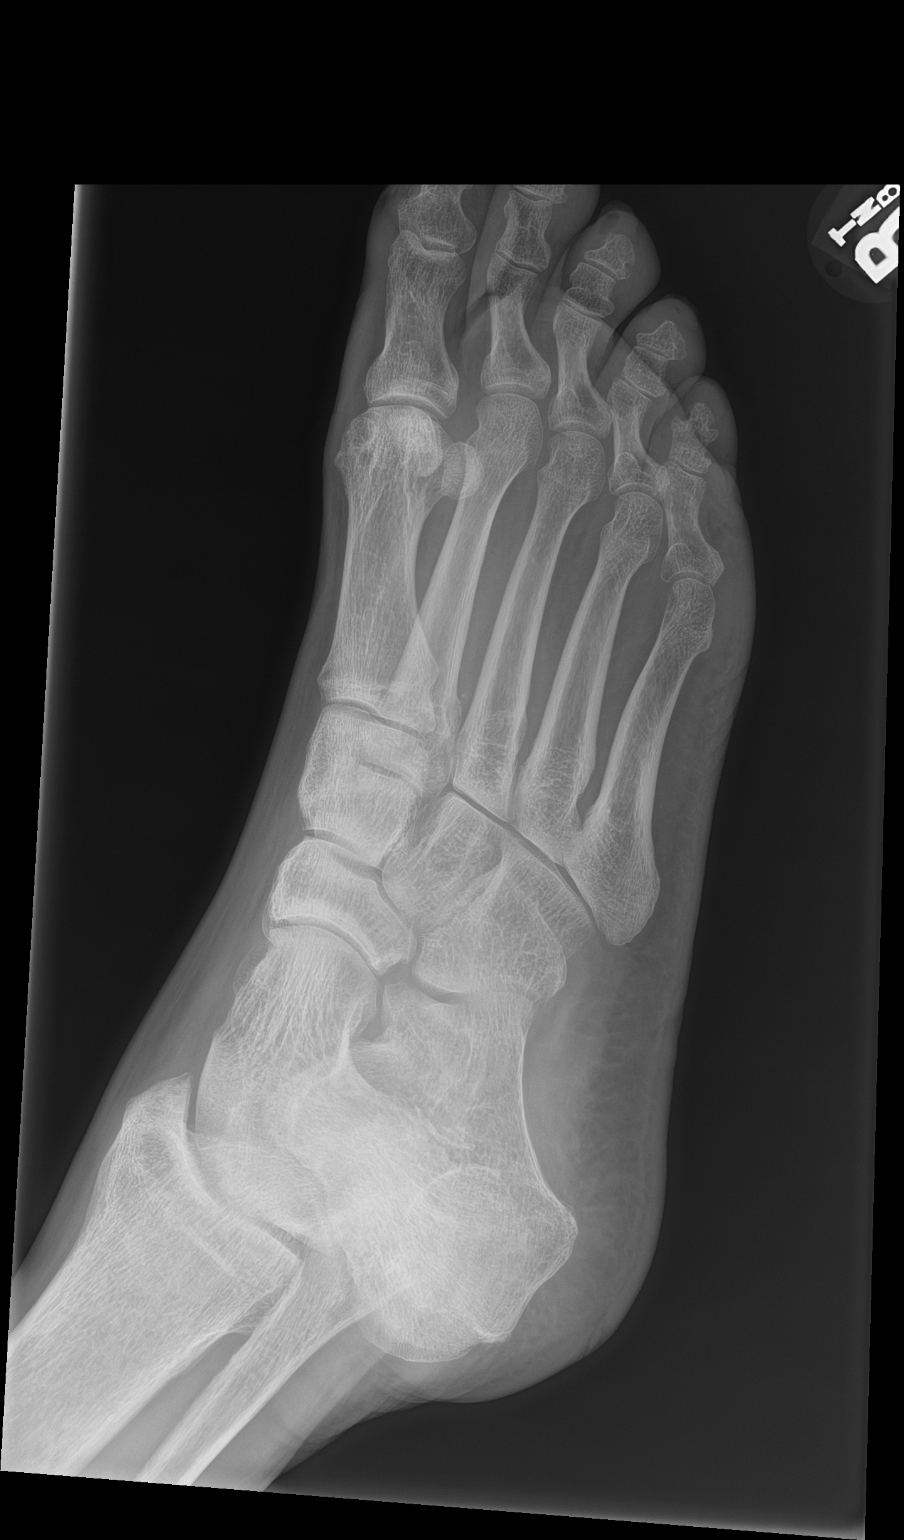

[foot lat]
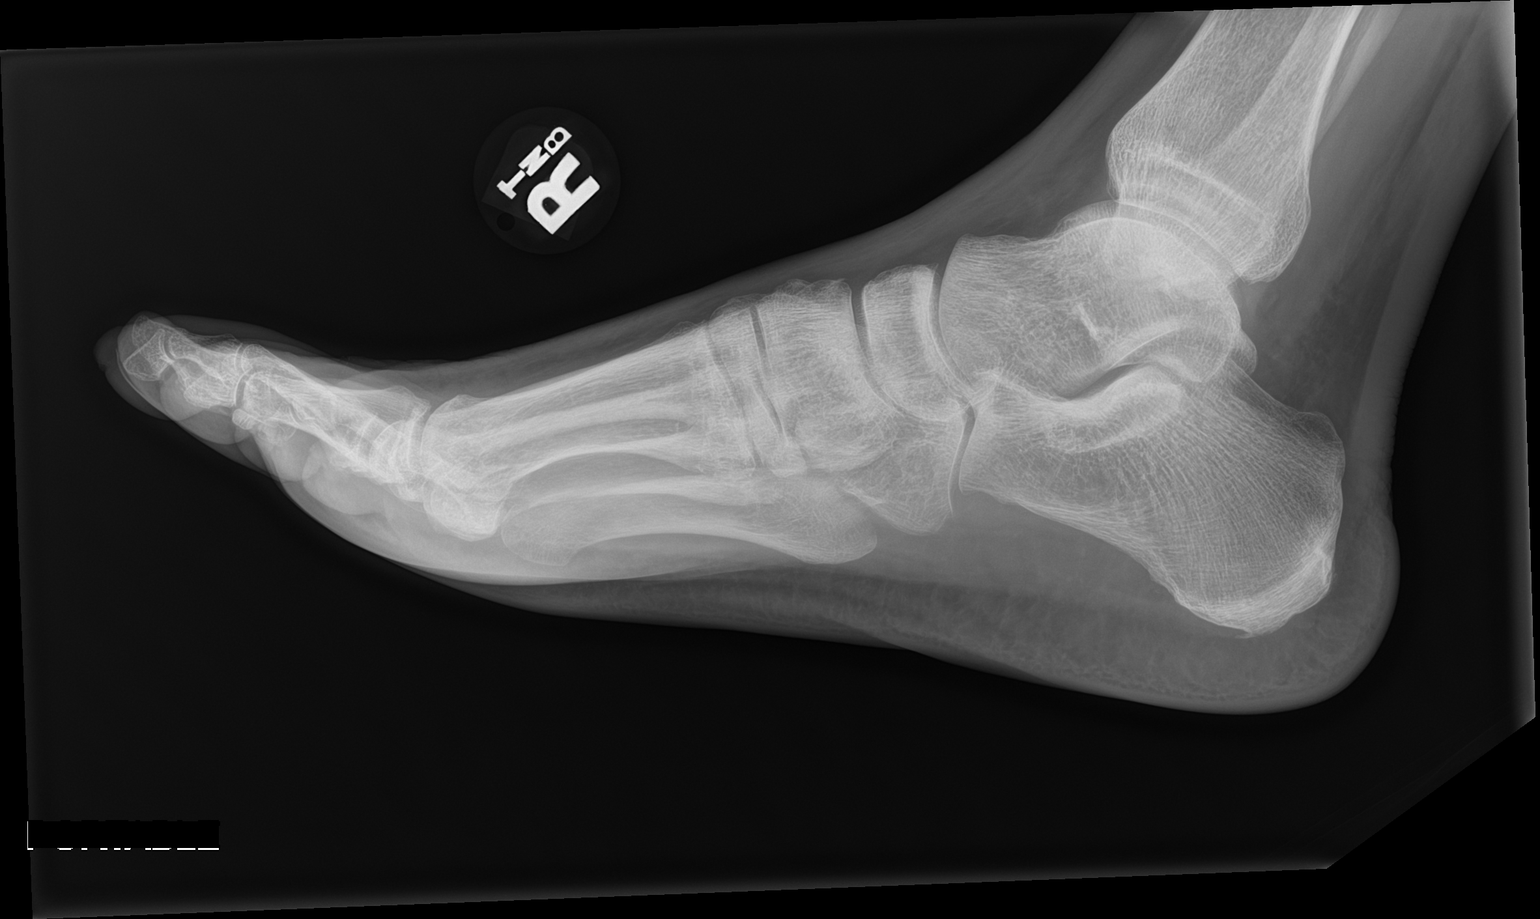

[3 of 3 positions shown; findings below may reference images not displayed]

FINDINGS: Soft tissue defect noted great toe, compatible with the reported
history of ulcer. No underlying bony erosion/destruction in the
phalanges of the great toe to suggest osteomyelitis. No evidence for
fracture or dislocation. Mild degenerative changes noted MTP joint
great toe.
IMPRESSION: Soft tissue ulcer noted in the great toe without underlying bony
destruction to suggest osteomyelitis by x-ray.

## 2020-05-30 ENCOUNTER — Telehealth: Payer: Self-pay | Admitting: Physician Assistant

## 2020-05-30 NOTE — Telephone Encounter (Signed)
I made my third attempt today to reach Juan Turner and get him rescheduled with the MM RNCM. I left my name and number for him to call me back.

## 2020-06-01 ENCOUNTER — Other Ambulatory Visit: Payer: Self-pay

## 2020-06-01 ENCOUNTER — Other Ambulatory Visit: Payer: Self-pay | Admitting: *Deleted

## 2020-06-01 NOTE — Patient Instructions (Signed)
Visit Information  Mr. Juan Turner  - as a part of your Medicaid benefit, you are eligible for care management and care coordination services at no cost or copay. I was unable to reach you by phone today but would be happy to help you with your health related needs. Please feel free to call me @ [phone].      Estanislado Emms RN, BSN [place]  [facility] [employer]

## 2020-06-01 NOTE — Patient Outreach (Signed)
Care Coordination  06/01/2020  Juan Turner Sep 05, 1955 [id number]   Medicaid Managed Care   Unsuccessful Outreach Note  06/01/2020 Name: Juan Turner MRN: [id number] DOB: 1955-08-22  Referred by: Dion Saucier, PA-C Reason for referral : Case Closure (RNCM case closure for unsuccessful outreach attempts x 3)   Multiple attempts have been made to follow up with case management. This case will be closed. The HR Managed Medicaid Team would be glade to assist if any new needs arise. [phone]  Follow Up Plan: We have been unable to make contact with the patient for follow up. The care management team is available to follow up with the patient after provider conversation with the patient regarding recommendation for care management engagement and subsequent re-referral to the care management team.   Estanislado Emms RN, BSN [place]  [facility] RN Care Coordinator

## 2020-08-01 ENCOUNTER — Other Ambulatory Visit: Payer: Medicare Other

## 2021-02-08 ENCOUNTER — Other Ambulatory Visit: Payer: Self-pay | Admitting: Vascular Surgery

## 2021-02-08 DIAGNOSIS — D509 Iron deficiency anemia, unspecified: Secondary | ICD-10-CM

## 2021-03-11 ENCOUNTER — Other Ambulatory Visit: Payer: Self-pay | Admitting: Vascular Surgery

## 2021-03-11 DIAGNOSIS — D509 Iron deficiency anemia, unspecified: Secondary | ICD-10-CM

## 2021-04-08 ENCOUNTER — Telehealth: Payer: Self-pay | Admitting: Cardiology

## 2021-04-08 NOTE — Telephone Encounter (Signed)
FYI.  °Contacted patient regarding recall appointment, patient notified our office they did not wish to keep this appointment at this time.  Deleted recall from system. °

## 2022-02-20 DIAGNOSIS — E1151 Type 2 diabetes mellitus with diabetic peripheral angiopathy without gangrene: Secondary | ICD-10-CM | POA: Diagnosis not present

## 2022-02-20 DIAGNOSIS — K219 Gastro-esophageal reflux disease without esophagitis: Secondary | ICD-10-CM | POA: Diagnosis not present

## 2022-02-20 DIAGNOSIS — I739 Peripheral vascular disease, unspecified: Secondary | ICD-10-CM | POA: Diagnosis not present

## 2022-02-20 DIAGNOSIS — F419 Anxiety disorder, unspecified: Secondary | ICD-10-CM | POA: Diagnosis not present

## 2022-02-20 DIAGNOSIS — I10 Essential (primary) hypertension: Secondary | ICD-10-CM | POA: Diagnosis not present

## 2022-02-20 DIAGNOSIS — Z6830 Body mass index (BMI) 30.0-30.9, adult: Secondary | ICD-10-CM | POA: Diagnosis not present

## 2022-02-20 DIAGNOSIS — I350 Nonrheumatic aortic (valve) stenosis: Secondary | ICD-10-CM | POA: Diagnosis not present

## 2022-02-20 DIAGNOSIS — R011 Cardiac murmur, unspecified: Secondary | ICD-10-CM | POA: Diagnosis not present

## 2022-04-15 DIAGNOSIS — Z1329 Encounter for screening for other suspected endocrine disorder: Secondary | ICD-10-CM | POA: Diagnosis not present

## 2022-04-15 DIAGNOSIS — R7989 Other specified abnormal findings of blood chemistry: Secondary | ICD-10-CM | POA: Diagnosis not present

## 2023-02-19 DIAGNOSIS — E785 Hyperlipidemia, unspecified: Secondary | ICD-10-CM | POA: Diagnosis not present

## 2023-02-19 DIAGNOSIS — I739 Peripheral vascular disease, unspecified: Secondary | ICD-10-CM | POA: Diagnosis not present

## 2023-02-19 DIAGNOSIS — R5383 Other fatigue: Secondary | ICD-10-CM | POA: Diagnosis not present

## 2023-02-19 DIAGNOSIS — I10 Essential (primary) hypertension: Secondary | ICD-10-CM | POA: Diagnosis not present

## 2023-02-19 DIAGNOSIS — R7989 Other specified abnormal findings of blood chemistry: Secondary | ICD-10-CM | POA: Diagnosis not present

## 2023-02-19 DIAGNOSIS — E1151 Type 2 diabetes mellitus with diabetic peripheral angiopathy without gangrene: Secondary | ICD-10-CM | POA: Diagnosis not present

## 2023-02-24 DIAGNOSIS — I739 Peripheral vascular disease, unspecified: Secondary | ICD-10-CM | POA: Diagnosis not present

## 2023-02-24 DIAGNOSIS — I350 Nonrheumatic aortic (valve) stenosis: Secondary | ICD-10-CM | POA: Diagnosis not present

## 2023-02-24 DIAGNOSIS — E1151 Type 2 diabetes mellitus with diabetic peripheral angiopathy without gangrene: Secondary | ICD-10-CM | POA: Diagnosis not present

## 2023-02-24 DIAGNOSIS — R030 Elevated blood-pressure reading, without diagnosis of hypertension: Secondary | ICD-10-CM | POA: Diagnosis not present

## 2023-02-24 DIAGNOSIS — E1159 Type 2 diabetes mellitus with other circulatory complications: Secondary | ICD-10-CM | POA: Diagnosis not present

## 2023-02-24 DIAGNOSIS — I10 Essential (primary) hypertension: Secondary | ICD-10-CM | POA: Diagnosis not present

## 2023-02-24 DIAGNOSIS — Z0001 Encounter for general adult medical examination with abnormal findings: Secondary | ICD-10-CM | POA: Diagnosis not present

## 2023-02-24 DIAGNOSIS — Z6830 Body mass index (BMI) 30.0-30.9, adult: Secondary | ICD-10-CM | POA: Diagnosis not present

## 2023-05-11 ENCOUNTER — Encounter (HOSPITAL_COMMUNITY): Payer: Self-pay

## 2023-05-11 ENCOUNTER — Emergency Department (HOSPITAL_COMMUNITY)

## 2023-05-11 ENCOUNTER — Other Ambulatory Visit: Payer: Self-pay

## 2023-05-11 ENCOUNTER — Emergency Department (HOSPITAL_COMMUNITY)
Admission: EM | Admit: 2023-05-11 | Discharge: 2023-05-11 | Disposition: A | Discharge status: Home / Self Care | Attending: Emergency Medicine | Admitting: Emergency Medicine

## 2023-05-11 DIAGNOSIS — L84 Corns and callosities: Secondary | ICD-10-CM | POA: Diagnosis not present

## 2023-05-11 DIAGNOSIS — Z7984 Long term (current) use of oral hypoglycemic drugs: Secondary | ICD-10-CM | POA: Insufficient documentation

## 2023-05-11 DIAGNOSIS — Z7902 Long term (current) use of antithrombotics/antiplatelets: Secondary | ICD-10-CM | POA: Diagnosis not present

## 2023-05-11 DIAGNOSIS — E119 Type 2 diabetes mellitus without complications: Secondary | ICD-10-CM | POA: Diagnosis not present

## 2023-05-11 DIAGNOSIS — Z79899 Other long term (current) drug therapy: Secondary | ICD-10-CM | POA: Insufficient documentation

## 2023-05-11 DIAGNOSIS — Z89421 Acquired absence of other right toe(s): Secondary | ICD-10-CM | POA: Diagnosis not present

## 2023-05-11 DIAGNOSIS — I10 Essential (primary) hypertension: Secondary | ICD-10-CM | POA: Insufficient documentation

## 2023-05-11 DIAGNOSIS — Z7982 Long term (current) use of aspirin: Secondary | ICD-10-CM | POA: Diagnosis not present

## 2023-05-11 DIAGNOSIS — M79673 Pain in unspecified foot: Secondary | ICD-10-CM | POA: Diagnosis present

## 2023-05-11 LAB — CBC
HCT: 43.1 % (ref 39.0–52.0)
Hemoglobin: 13.6 g/dL (ref 13.0–17.0)
MCH: 26.9 pg (ref 26.0–34.0)
MCHC: 31.6 g/dL (ref 30.0–36.0)
MCV: 85.2 fL (ref 80.0–100.0)
Platelets: 161 10*3/uL (ref 150–400)
RBC: 5.06 MIL/uL (ref 4.22–5.81)
RDW: 13.7 % (ref 11.5–15.5)
WBC: 5.2 10*3/uL (ref 4.0–10.5)
nRBC: 0 % (ref 0.0–0.2)

## 2023-05-11 LAB — BASIC METABOLIC PANEL WITH GFR
Anion gap: 11 (ref 5–15)
BUN: 11 mg/dL (ref 8–23)
CO2: 25 mmol/L (ref 22–32)
Calcium: 9.4 mg/dL (ref 8.9–10.3)
Chloride: 100 mmol/L (ref 98–111)
Creatinine, Ser: 1.05 mg/dL (ref 0.61–1.24)
GFR, Estimated: >60 mL/min (ref >60)
Glucose, Bld: 140 mg/dL — ABNORMAL HIGH (ref 70–99)
Potassium: 3.9 mmol/L (ref 3.5–5.1)
Sodium: 136 mmol/L (ref 135–145)

## 2023-05-11 LAB — URINALYSIS, ROUTINE W REFLEX MICROSCOPIC
Bilirubin Urine: NEGATIVE
Glucose, UA: NEGATIVE mg/dL
Hgb urine dipstick: NEGATIVE
Ketones, ur: NEGATIVE mg/dL
Leukocytes,Ua: NEGATIVE
Nitrite: NEGATIVE
Protein, ur: NEGATIVE mg/dL
Specific Gravity, Urine: 1.006 (ref 1.005–1.030)
pH: 6 (ref 5.0–8.0)

## 2023-05-11 NOTE — ED Triage Notes (Signed)
 Pt arrived via POV c/o injury to his right heel on his foot. Pt is diabetic and has previous amputation on this foot. Pt ambulatory in Triage.

## 2023-05-11 NOTE — ED Provider Notes (Signed)
 [place] EMERGENCY DEPARTMENT AT [facility] Provider Note   CSN: [id number] Arrival date & time: 05/11/23  1057     History  Chief Complaint  Patient presents with   Foot Pain    Juan Turner is a 68 y.o. male with a history including type 2 diabetes, hypertension, peripheral vascular disease history of femoral tibial bypass graft in 2021, along with transmetatarsal amputation of the right foot also in 2021 here for evaluation of a callus on his right heel.  He states that he wore the past week has caused a callus at his right lateral heel, he denies pain at the site, nor has there been drainage, swelling or surrounding erythema.  He is scheduled to see his podiatrist in 3 days but his wife insisted he come in to get it checked out today.  He denies fevers or chills or any other complaints.  The history is provided by the patient.       Home Medications Prior to Admission medications   Medication Sig Start Date End Date Taking? Authorizing Provider  amLODipine-benazepril (LOTREL) 5-10 MG per capsule Take 1 capsule by mouth daily.    [provider]  ammonium lactate (AMLACTIN) 12 % cream Apply topically as needed for dry skin. Apply only to the heel, not to an open wound 05/03/19   Vivi Barrack, DPM  aspirin 325 MG tablet Take 325 mg by mouth daily.    [provider]  blood glucose meter kit and supplies KIT Dispense based on patient and insurance preference. Use up to four times daily as directed. (FOR ICD-9 250.00, 250.01). 04/09/19   Marguerita Merles Latif, DO  clopidogrel (PLAVIX) 75 MG tablet TAKE 1 TABLET(75 MG) BY MOUTH DAILY AT 6 AM 10/13/19   Chuck Hint, MD  docusate sodium (COLACE) 100 MG capsule Take 1 capsule (100 mg total) by mouth daily. 04/10/19   Marguerita Merles Latif, DO  glipiZIDE (GLUCOTROL) 5 MG tablet Take 1 tablet (5 mg total) by mouth daily before breakfast. 04/09/19   Marguerita Merles Latif, DO  iron polysaccharides  (NIFEREX) 150 MG capsule TAKE 1 CAPSULE(150 MG) BY MOUTH DAILY 03/11/21   Maeola Harman, MD  metFORMIN (GLUCOPHAGE) 1000 MG tablet Take 1 tablet (1,000 mg total) by mouth 2 (two) times daily with a meal. 04/09/19 05/27/19  Sheikh, Kateri Mc Latif, DO  nicotine (NICODERM CQ - DOSED IN MG/24 HOURS) 14 mg/24hr patch Place 14 mg onto the skin daily.    [provider]  pantoprazole (PROTONIX) 40 MG tablet Take 1 tablet (40 mg total) by mouth daily. 04/10/19   Marguerita Merles Latif, DO  rosuvastatin (CRESTOR) 10 MG tablet Take 1 tablet (10 mg total) by mouth daily at 6 PM. 04/09/19   Merlene Laughter, DO      Allergies    Pollen extract    Review of Systems   Review of Systems  Constitutional:  Negative for fever.  HENT: Negative.    Eyes: Negative.   Respiratory:  Negative for chest tightness and shortness of breath.   Cardiovascular:  Negative for chest pain.  Gastrointestinal:  Negative for nausea.  Genitourinary: Negative.   Skin:  Positive for wound. Negative for rash.  Neurological:  Negative for dizziness and weakness.  Psychiatric/Behavioral: Negative.      Physical Exam Updated Vital Signs BP (!) 164/77   Pulse (!) 59   Temp 98.6 F (37 C) (Oral)   Resp 18   Ht 6' (1.829  m)   Wt 71.5 kg   SpO2 100%   BMI 21.38 kg/m  Physical Exam Vitals and nursing note reviewed.  Constitutional:      Appearance: He is well-developed.  HENT:     Head: Normocephalic and atraumatic.  Eyes:     Conjunctiva/sclera: Conjunctivae normal.  Cardiovascular:     Rate and Rhythm: Normal rate.  Pulmonary:     Effort: Pulmonary effort is normal.     Breath sounds: No wheezing.  Musculoskeletal:        General: No swelling or tenderness. Normal range of motion.     Cervical back: Normal range of motion.     Comments: Callus right lateral heel with swallow central split, no drainage,  no surrounding erythema.  Additional similar lesion mid distal amputation line which pt states  has been present since his amputation in 2021.    Skin:    General: Skin is warm and dry.  Neurological:     Mental Status: He is alert.     ED Results / Procedures / Treatments   Labs (all labs ordered are listed, but only abnormal results are displayed) Labs Reviewed  BASIC METABOLIC PANEL WITH GFR - Abnormal; Notable for the following components:      Result Value   Glucose, Bld 140 (*)    All other components within normal limits  URINALYSIS, ROUTINE W REFLEX MICROSCOPIC  CBC    EKG None  Radiology No results found.  Procedures Procedures    Medications Ordered in ED Medications - No data to display  ED Course/ Medical Decision Making/ A&P                                 Medical Decision Making Patient presenting with a callus at his right heel laterally, there is no tenderness around the site, though he does have a history of diabetic neuropathy.  There are no exam findings to suggest skin infection/cellulitis.  He was supplied with wound care including a padded dressing for the site to prevent further friction and injury.  He is encouraged to keep his appointment with his podiatrist in 3 days.  Amount and/or Complexity of Data Reviewed Labs: ordered.    Details: Labs reviewed, he has a normal WBC count at 5.2, urinalysis is negative, his be met is normal with a glucose of 140 Radiology: ordered and independent interpretation performed.    Details: Foot imaging is reviewed, there is no obvious deep tissue infection, callus with crack appears to be very superficial on this film.  There has been a delay with this reading, no radiologic interpretation, will follow for this result but not hold patient here awaiting radiological interpretation.           Final Clinical Impression(s) / ED Diagnoses Final diagnoses:  Heel callus    Rx / DC Orders ED Discharge Orders     None         Victoriano Lain 05/11/23 1528    Pricilla Loveless, MD 05/12/23  [phone]

## 2023-05-11 NOTE — Discharge Instructions (Addendum)
 Plan to followup with Dr. Jamse Arn on Thursday as planned.  There is no obvious infection of your callus today.

## 2023-05-14 ENCOUNTER — Ambulatory Visit: Admitting: Podiatry
